# Patient Record
Sex: Male | Born: 1950 | Race: White | Hispanic: No | Marital: Married | Smoking: Light tobacco smoker
Health system: Southern US, Community
[De-identification: ages and names within clinical notes are randomized; demographics above are authoritative.]

## PROBLEM LIST (undated history)

## (undated) DIAGNOSIS — E291 Testicular hypofunction: Secondary | ICD-10-CM

## (undated) DIAGNOSIS — M1990 Unspecified osteoarthritis, unspecified site: Secondary | ICD-10-CM

## (undated) DIAGNOSIS — I10 Essential (primary) hypertension: Secondary | ICD-10-CM

## (undated) DIAGNOSIS — E559 Vitamin D deficiency, unspecified: Secondary | ICD-10-CM

## (undated) DIAGNOSIS — T7840XA Allergy, unspecified, initial encounter: Secondary | ICD-10-CM

## (undated) DIAGNOSIS — E785 Hyperlipidemia, unspecified: Secondary | ICD-10-CM

## (undated) HISTORY — PX: KNEE ARTHROSCOPY: SUR90

## (undated) HISTORY — DX: Unspecified osteoarthritis, unspecified site: M19.90

## (undated) HISTORY — DX: Vitamin D deficiency, unspecified: E55.9

## (undated) HISTORY — DX: Testicular hypofunction: E29.1

## (undated) HISTORY — PX: POLYPECTOMY: SHX149

## (undated) HISTORY — DX: Allergy, unspecified, initial encounter: T78.40XA

## (undated) HISTORY — DX: Hyperlipidemia, unspecified: E78.5

## (undated) HISTORY — PX: COLONOSCOPY: SHX174

## (undated) HISTORY — DX: Essential (primary) hypertension: I10

---
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
## 1981-07-09 HISTORY — PX: ANKLE SURGERY: SHX546

## 2001-01-13 ENCOUNTER — Ambulatory Visit (HOSPITAL_COMMUNITY): Admission: RE | Admit: 2001-01-13 | Discharge: 2001-01-13 | Payer: Self-pay | Admitting: Internal Medicine

## 2001-01-13 ENCOUNTER — Encounter: Payer: Self-pay | Admitting: Internal Medicine

## 2001-07-02 ENCOUNTER — Emergency Department (HOSPITAL_COMMUNITY): Admission: EM | Admit: 2001-07-02 | Discharge: 2001-07-02 | Payer: Self-pay | Admitting: Emergency Medicine

## 2002-08-21 ENCOUNTER — Encounter: Payer: Self-pay | Admitting: Internal Medicine

## 2002-08-21 ENCOUNTER — Encounter: Admission: RE | Admit: 2002-08-21 | Discharge: 2002-08-21 | Payer: Self-pay | Admitting: Internal Medicine

## 2005-01-13 ENCOUNTER — Emergency Department (HOSPITAL_COMMUNITY): Admission: EM | Admit: 2005-01-13 | Discharge: 2005-01-13 | Payer: Self-pay | Admitting: Emergency Medicine

## 2008-03-11 ENCOUNTER — Ambulatory Visit: Payer: Self-pay | Admitting: Gastroenterology

## 2008-03-22 ENCOUNTER — Ambulatory Visit: Payer: Self-pay | Admitting: Gastroenterology

## 2008-03-22 ENCOUNTER — Encounter: Payer: Self-pay | Admitting: Gastroenterology

## 2008-03-25 ENCOUNTER — Encounter: Payer: Self-pay | Admitting: Gastroenterology

## 2010-07-09 HISTORY — PX: MOUTH SURGERY: SHX715

## 2013-02-05 ENCOUNTER — Encounter: Payer: Self-pay | Admitting: Gastroenterology

## 2013-05-19 ENCOUNTER — Encounter: Payer: Self-pay | Admitting: Gastroenterology

## 2013-07-17 ENCOUNTER — Ambulatory Visit (AMBULATORY_SURGERY_CENTER): Payer: Self-pay | Admitting: *Deleted

## 2013-07-17 VITALS — Ht 72.0 in | Wt 210.2 lb

## 2013-07-17 DIAGNOSIS — Z86010 Personal history of colonic polyps: Secondary | ICD-10-CM

## 2013-07-17 MED ORDER — NA SULFATE-K SULFATE-MG SULF 17.5-3.13-1.6 GM/177ML PO SOLN
1.0000 | Freq: Once | ORAL | Status: DC
Start: 2013-07-17 — End: 2013-07-31

## 2013-07-17 NOTE — Progress Notes (Signed)
No allergies to eggs or soy. No problems with anesthesia.  

## 2013-07-20 ENCOUNTER — Encounter: Payer: Self-pay | Admitting: Gastroenterology

## 2013-07-31 ENCOUNTER — Encounter: Payer: Self-pay | Admitting: Gastroenterology

## 2013-07-31 ENCOUNTER — Ambulatory Visit (AMBULATORY_SURGERY_CENTER): Payer: BC Managed Care – PPO | Admitting: Gastroenterology

## 2013-07-31 VITALS — BP 112/75 | HR 65 | Temp 96.9°F | Resp 16 | Ht 72.0 in | Wt 210.0 lb

## 2013-07-31 DIAGNOSIS — K5730 Diverticulosis of large intestine without perforation or abscess without bleeding: Secondary | ICD-10-CM

## 2013-07-31 DIAGNOSIS — Z800 Family history of malignant neoplasm of digestive organs: Secondary | ICD-10-CM

## 2013-07-31 DIAGNOSIS — Z86010 Personal history of colonic polyps: Secondary | ICD-10-CM

## 2013-07-31 MED ORDER — SODIUM CHLORIDE 0.9 % IV SOLN: 500.0000 mL | INTRAVENOUS | Status: DC

## 2013-07-31 NOTE — Patient Instructions (Signed)
Impressions/recommendations:  Internal hemorrhoids (handouts given) Diverticulosis (handout given) High Fiber diet (handout given)  YOU HAD AN ENDOSCOPIC PROCEDURE TODAY AT THE [facility]: Refer to the procedure report that was given to you for any specific questions about what was found during the examination.  If the procedure report does not answer your questions, please call your gastroenterologist to clarify.  If you requested that your care partner not be given the details of your procedure findings, then the procedure report has been included in a sealed envelope for you to review at your convenience later.  YOU SHOULD EXPECT: Some feelings of bloating in the abdomen. Passage of more gas than usual.  Walking can help get rid of the air that was put into your GI tract during the procedure and reduce the bloating. If you had a lower endoscopy (such as a colonoscopy or flexible sigmoidoscopy) you may notice spotting of blood in your stool or on the toilet paper. If you underwent a bowel prep for your procedure, then you may not have a normal bowel movement for a few days.  DIET: Your first meal following the procedure should be a light meal and then it is ok to progress to your normal diet.  A half-sandwich or bowl of soup is an example of a good first meal.  Heavy or fried foods are harder to digest and may make you feel nauseous or bloated.  Likewise meals heavy in dairy and vegetables can cause extra gas to form and this can also increase the bloating.  Drink plenty of fluids but you should avoid alcoholic beverages for 24 hours.  ACTIVITY: Your care partner should take you home directly after the procedure.  You should plan to take it easy, moving slowly for the rest of the day.  You can resume normal activity the day after the procedure however you should NOT DRIVE or use heavy machinery for 24 hours (because of the sedation medicines used during the test).    SYMPTOMS TO REPORT  IMMEDIATELY: A gastroenterologist can be reached at any hour.  During normal business hours, 8:30 AM to 5:00 PM Monday through Friday, call [phone].  After hours and on weekends, please call the GI answering service at [phone] who will take a message and have the physician on call contact you.   Following lower endoscopy (colonoscopy or flexible sigmoidoscopy):  Excessive amounts of blood in the stool  Significant tenderness or worsening of abdominal pains  Swelling of the abdomen that is new, acute  Fever of 100F or higher  FOLLOW UP: If any biopsies were taken you will be contacted by phone or by letter within the next 1-3 weeks.  Call your gastroenterologist if you have not heard about the biopsies in 3 weeks.  Our staff will call the home number listed on your records the next business day following your procedure to check on you and address any questions or concerns that you may have at that time regarding the information given to you following your procedure. This is a courtesy call and so if there is no answer at the home number and we have not heard from you through the emergency physician on call, we will assume that you have returned to your regular daily activities without incident.  SIGNATURES/CONFIDENTIALITY: You and/or your care partner have signed paperwork which will be entered into your electronic medical record.  These signatures attest to the fact that that the information above on your After Visit  Summary has been reviewed and is understood.  Full responsibility of the confidentiality of this discharge information lies with you and/or your care-partner. 

## 2013-07-31 NOTE — Op Note (Signed)
[facility] [address] [place], [id number]   COLONOSCOPY PROCEDURE REPORT  PATIENT: Brett Perez, Brett G.  MR#: [id number] BIRTHDATE: 05/08/1951 , 62  yrs. old GENDER: Male ENDOSCOPIST: Louis Meckelobert D Kaplan, MD REFERRED QM:VHQIONGBY:William Oneta RackMcKeown, M.D. PROCEDURE DATE:  07/31/2013 PROCEDURE:   Colonoscopy, diagnostic First Screening Colonoscopy - Avg.  risk and is 50 yrs.  old or older - No.      History of Adenoma - Now for follow-up colonoscopy & has been > or = to 3 yrs.  Yes hx of adenoma.  Has been 3 or more years since last colonoscopy.  Polyps Removed Today? No.  Recommend repeat exam, <10 yrs? Yes.  High risk (family or personal hx). ASA CLASS:   Class II INDICATIONS:Patient's personal history of adenomatous colon polyps 2003 and 2009 MEDICATIONS: MAC sedation, administered by CRNA and propofol (Diprivan) 350mg  IV  DESCRIPTION OF PROCEDURE:   After the risks benefits and alternatives of the procedure were thoroughly explained, informed consent was obtained.  A digital rectal exam revealed no abnormalities of the rectum.   The LB [id number]  endoscope was introduced through the anus and advanced to the cecum, which was identified by both the appendix and ileocecal valve. No adverse events experienced.   The quality of the prep was excellent using Suprep  The instrument was then slowly withdrawn as the colon was fully examined.      COLON FINDINGS: Internal hemorrhoids were found.   The colon was otherwise normal.  There was no diverticulosis, inflammation, polyps or cancers unless previously stated.   Mild diverticulosis was noted in the sigmoid colon.  Retroflexed views revealed no abnormalities. The time to cecum=5 minutes 01 seconds.  Withdrawal time=9 minutes 24 seconds.  The scope was withdrawn and the procedure completed. COMPLICATIONS: There were no complications.  ENDOSCOPIC IMPRESSION: 1.   Internal hemorrhoids 2.    Diverticulosis  RECOMMENDATIONS: Given your significant family history of colon cancer, you should have a repeat colonoscopy in 5 years   eSigned:  Louis Meckelobert D Kaplan, MD 07/31/2013 10:40 AM   cc:   PATIENT NAME:  Brett Perez, Brett G. MR#: [id number]

## 2013-08-03 ENCOUNTER — Telehealth: Payer: Self-pay | Admitting: *Deleted

## 2013-08-03 NOTE — Telephone Encounter (Signed)
Name identifier, left message, follow-up 

## 2013-08-11 DIAGNOSIS — E349 Endocrine disorder, unspecified: Secondary | ICD-10-CM | POA: Insufficient documentation

## 2013-08-11 DIAGNOSIS — I10 Essential (primary) hypertension: Secondary | ICD-10-CM | POA: Insufficient documentation

## 2013-08-11 DIAGNOSIS — E559 Vitamin D deficiency, unspecified: Secondary | ICD-10-CM | POA: Insufficient documentation

## 2013-08-11 DIAGNOSIS — E782 Mixed hyperlipidemia: Secondary | ICD-10-CM | POA: Insufficient documentation

## 2013-08-12 ENCOUNTER — Encounter: Payer: Self-pay | Admitting: Internal Medicine

## 2013-08-12 NOTE — Addendum Note (Signed)
Addended by: Maple HudsonSMITH, KATHY S on: 08/12/2013 11:04 AM   Modules accepted: Level of Service

## 2013-08-13 ENCOUNTER — Encounter: Payer: Self-pay | Admitting: Internal Medicine

## 2013-08-13 ENCOUNTER — Ambulatory Visit (INDEPENDENT_AMBULATORY_CARE_PROVIDER_SITE_OTHER): Payer: BC Managed Care – PPO | Admitting: Internal Medicine

## 2013-08-13 VITALS — BP 126/84 | HR 56 | Temp 97.9°F | Resp 16 | Ht 73.5 in | Wt 211.0 lb

## 2013-08-13 DIAGNOSIS — I10 Essential (primary) hypertension: Secondary | ICD-10-CM

## 2013-08-13 DIAGNOSIS — Z1212 Encounter for screening for malignant neoplasm of rectum: Secondary | ICD-10-CM

## 2013-08-13 DIAGNOSIS — Z0000 Encounter for general adult medical examination without abnormal findings: Secondary | ICD-10-CM

## 2013-08-13 DIAGNOSIS — R740 Nonspecific elevation of levels of transaminase and lactic acid dehydrogenase [LDH]: Secondary | ICD-10-CM

## 2013-08-13 DIAGNOSIS — Z79899 Other long term (current) drug therapy: Secondary | ICD-10-CM

## 2013-08-13 DIAGNOSIS — R7309 Other abnormal glucose: Secondary | ICD-10-CM | POA: Insufficient documentation

## 2013-08-13 DIAGNOSIS — R7402 Elevation of levels of lactic acid dehydrogenase (LDH): Secondary | ICD-10-CM

## 2013-08-13 DIAGNOSIS — E559 Vitamin D deficiency, unspecified: Secondary | ICD-10-CM

## 2013-08-13 DIAGNOSIS — Z113 Encounter for screening for infections with a predominantly sexual mode of transmission: Secondary | ICD-10-CM

## 2013-08-13 DIAGNOSIS — Z125 Encounter for screening for malignant neoplasm of prostate: Secondary | ICD-10-CM

## 2013-08-13 DIAGNOSIS — Z111 Encounter for screening for respiratory tuberculosis: Secondary | ICD-10-CM

## 2013-08-13 DIAGNOSIS — R7401 Elevation of levels of liver transaminase levels: Secondary | ICD-10-CM

## 2013-08-13 LAB — CBC WITH DIFFERENTIAL/PLATELET
BASOS ABS: 0 10*3/uL (ref 0.0–0.1)
BASOS PCT: 1 % (ref 0–1)
Eosinophils Absolute: 0.2 10*3/uL (ref 0.0–0.7)
Eosinophils Relative: 3 % (ref 0–5)
HCT: 43.9 % (ref 39.0–52.0)
Hemoglobin: 15.7 g/dL (ref 13.0–17.0)
Lymphocytes Relative: 51 % — ABNORMAL HIGH (ref 12–46)
Lymphs Abs: 3 10*3/uL (ref 0.7–4.0)
MCH: 31 pg (ref 26.0–34.0)
MCHC: 35.8 g/dL (ref 30.0–36.0)
MCV: 86.6 fL (ref 78.0–100.0)
Monocytes Absolute: 0.5 10*3/uL (ref 0.1–1.0)
Monocytes Relative: 9 % (ref 3–12)
NEUTROS ABS: 2.1 10*3/uL (ref 1.7–7.7)
NEUTROS PCT: 36 % — AB (ref 43–77)
PLATELETS: 261 10*3/uL (ref 150–400)
RBC: 5.07 MIL/uL (ref 4.22–5.81)
RDW: 13.7 % (ref 11.5–15.5)
WBC: 5.8 10*3/uL (ref 4.0–10.5)

## 2013-08-13 LAB — HEPATIC FUNCTION PANEL
ALBUMIN: 4.5 g/dL (ref 3.5–5.2)
ALK PHOS: 52 U/L (ref 39–117)
ALT: 51 U/L (ref 0–53)
AST: 32 U/L (ref 0–37)
Bilirubin, Direct: 0.2 mg/dL (ref 0.0–0.3)
Indirect Bilirubin: 0.5 mg/dL (ref 0.2–1.2)
TOTAL PROTEIN: 6.7 g/dL (ref 6.0–8.3)
Total Bilirubin: 0.7 mg/dL (ref 0.2–1.2)

## 2013-08-13 LAB — MAGNESIUM: Magnesium: 1.9 mg/dL (ref 1.5–2.5)

## 2013-08-13 LAB — HEMOGLOBIN A1C
Hgb A1c MFr Bld: 5.4 % (ref <5.7)
Mean Plasma Glucose: 108 mg/dL (ref <117)

## 2013-08-13 LAB — BASIC METABOLIC PANEL WITH GFR
BUN: 14 mg/dL (ref 6–23)
CHLORIDE: 104 meq/L (ref 96–112)
CO2: 27 mEq/L (ref 19–32)
CREATININE: 0.89 mg/dL (ref 0.50–1.35)
Calcium: 9.3 mg/dL (ref 8.4–10.5)
Glucose, Bld: 86 mg/dL (ref 70–99)
POTASSIUM: 4.1 meq/L (ref 3.5–5.3)
Sodium: 138 mEq/L (ref 135–145)

## 2013-08-13 LAB — LIPID PANEL
CHOL/HDL RATIO: 2.3 ratio
Cholesterol: 178 mg/dL (ref 0–200)
HDL: 76 mg/dL (ref >39)
LDL CALC: 84 mg/dL (ref 0–99)
Triglycerides: 92 mg/dL (ref <150)
VLDL: 18 mg/dL (ref 0–40)

## 2013-08-13 NOTE — Progress Notes (Signed)
Patient ID: Brett Perez, male   DOB: 25-Jul-1950, 63 y.o.   MRN: [id number]  Annual Screening Comprehensive Examination  This very nice 63 y.o.  MWM presents for complete physical.  Patient has been followed for HTN, Hyperlipidemia, Testosterone  and Vitamin D Deficiency.   HTN predates since 2006. Patient's BP has been controlled at home.Today's BP: 126/84 mmHg. Patient denies any cardiac symptoms as chest pain, palpitations, shortness of breath, dizziness or ankle swelling.   Patient's hyperlipidemia is controlled with diet and medications. Patient denies myalgias or other medication SE's. Last cholesterol last visit was 177, triglycerides 76, HDL 68 and LDL 94 - all at goal.     Patient is screened prediabetes with last A1c of 5.2%. Patient denies reactive hypoglycemic symptoms, visual blurring, diabetic polys, or paresthesias.    Finally, patient has history of Vitamin D Deficiency with last vitamin D of 77. He also has Testosterone Deficiency and ha sbeen on replacement therapy since 2009.       Medication List         ANDROGEL 20.25 MG/1.25GM (1.62%) Gel  Generic drug:  Testosterone  Place onto the skin. Apply 4 pumps daily.  Pt states he applies 4 small pumps to each arm daily     aspirin 81 MG tablet  Take 81 mg by mouth 3 (three) times a week.     ATENOLOL PO  Take 100 mg by mouth daily.     CRESTOR PO  Take 40 mg by mouth daily.     Fish Oil 1000 MG Caps  Take by mouth daily.     glucosamine-chondroitin 500-400 MG tablet  Take 1 tablet by mouth 2 (two) times daily.     multivitamin tablet  Take 1 tablet by mouth daily.     vitamin C 500 MG tablet  Commonly known as:  ASCORBIC ACID  Take 500 mg by mouth daily.     VITAMIN D PO  Take by mouth. Takes 4000 units daily        No Known Allergies  Past Medical History  Diagnosis Date  . Hyperlipidemia   . Hypertension   . Vitamin D deficiency   . Other testicular hypofunction     Past Surgical History   Procedure Laterality Date  . Appendectomy  1960  . Ankle surgery Left 1983    for bone spur  . Tonsillectomy  1958  . Knee arthroscopy Left 1986, 1987    Family History  Problem Relation Age of Onset  . Colon cancer Mother 13  . Colon cancer Maternal Uncle 30    History   Social History  . Marital Status: Married    Spouse Name: N/A    Number of Children: N/A  . Years of Education: N/A   Occupational History  . Not on file.   Social History Main Topics  . Smoking status: Current Some Day Smoker    Types: Cigars  . Smokeless tobacco: Never Used     Comment: occasional cigar//once a month  . Alcohol Use: 6.0 oz/week    10 Cans of beer per week  . Drug Use: No  . Sexual Activity: Not on file     ROS Constitutional: Denies fever, chills, weight loss/gain, headaches, insomnia, fatigue, night sweats, and change in appetite. Eyes: Denies redness, blurred vision, diplopia, discharge, itchy, watery eyes.  ENT: Denies discharge, congestion, post nasal drip, epistaxis, sore throat, earache, hearing loss, dental pain, Tinnitus, Vertigo, Sinus pain, snoring.  Cardio: Denies chest  pain, palpitations, irregular heartbeat, syncope, dyspnea, diaphoresis, orthopnea, PND, claudication, edema Respiratory: denies cough, dyspnea, DOE, pleurisy, hoarseness, laryngitis, wheezing.  Gastrointestinal: Denies dysphagia, heartburn, reflux, water brash, pain, cramps, nausea, vomiting, bloating, diarrhea, constipation, hematemesis, melena, hematochezia, jaundice, hemorrhoids Genitourinary: Denies dysuria, frequency, urgency, nocturia, hesitancy, discharge, hematuria, flank pain Musculoskeletal: Denies arthralgia, myalgia, stiffness, Jt. Swelling, pain, limp, and strain/sprain. Skin: Denies puritis, rash, hives, warts, acne, eczema, changing in skin lesion Neuro: No weakness, tremor, incoordination, spasms, paresthesia, pain Psychiatric: Denies confusion, memory loss, sensory loss Endocrine: Denies  change in weight, skin, hair change, nocturia, and paresthesia, diabetic polys, visual blurring, hyper / hypo glycemic episodes.  Heme/Lymph: No excessive bleeding, bruising, or elarged lymph nodes.  BP: 126/84  Pulse: 56  Temp: 97.9 F (36.6 C)  Resp: 16    Estimated body mass index is 27.46 kg/(m^2) as calculated from the following:   Height as of this encounter: 6' 1.5" (1.867 m).   Weight as of this encounter: 211 lb (95.709 kg).  Physical Exam General Appearance: Well nourished, in no apparent distress. Eyes: PERRLA, EOMs, conjunctiva no swelling or erythema, normal fundi and vessels. Sinuses: No frontal/maxillary tenderness ENT/Mouth: EACs patent / TMs  nl. Nares clear without erythema, swelling, mucoid exudates. Oral hygiene is good. No erythema, swelling, or exudate. Tongue normal, non-obstructing. Tonsils not swollen or erythematous. Hearing normal.  Neck: Supple, thyroid normal. No bruits, nodes or JVD. Respiratory: Respiratory effort normal.  BS equal and clear bilateral without rales, rhonci, wheezing or stridor. Cardio: Heart sounds are normal with regular rate and rhythm and no murmurs, rubs or gallops. Peripheral pulses are normal and equal bilaterally without edema. No aortic or femoral bruits. Chest: symmetric with normal excursions and percussion.  Abdomen: Flat, soft, with bowl sounds. Nontender, no guarding, rebound, hernias, masses, or organomegaly.  Lymphatics: Non tender without lymphadenopathy.  Genitourinary: No hernias.Testes nl. DRE - prostate nl for age - smooth & firm w/o nodules. Musculoskeletal: Full ROM all peripheral extremities, joint stability, 5/5 strength, and normal gait. Skin: Warm and dry without rashes, lesions, cyanosis, clubbing or  ecchymosis.  Neuro: Cranial nerves intact, reflexes equal bilaterally. Normal muscle tone, no cerebellar symptoms. Sensation intact.  Pysch: Awake and oriented X 3, normal affect, insight and judgment appropriate.    Assessment and Plan  1. Annual Screening Examination 2. Hypertension  3. Hyperlipidemia 4. Pre Diabetes 5. Vitamin D Deficiency 6. Testosterone Deficiency  Continue prudent diet as discussed, weight control, BP monitoring, regular exercise, and medications as discussed.  Discussed med effects and SE's. Routine screening labs and tests as requested with regular follow-up as recommended.

## 2013-08-13 NOTE — Patient Instructions (Signed)

## 2013-08-14 LAB — URINALYSIS, MICROSCOPIC ONLY
Bacteria, UA: NONE SEEN
Casts: NONE SEEN
Crystals: NONE SEEN
SQUAMOUS EPITHELIAL / LPF: NONE SEEN

## 2013-08-14 LAB — VITAMIN D 25 HYDROXY (VIT D DEFICIENCY, FRACTURES): Vit D, 25-Hydroxy: 78 ng/mL (ref 30–89)

## 2013-08-14 LAB — HEPATITIS B CORE ANTIBODY, TOTAL: Hep B Core Total Ab: NONREACTIVE

## 2013-08-14 LAB — MICROALBUMIN / CREATININE URINE RATIO
Creatinine, Urine: 45.8 mg/dL
Microalb Creat Ratio: 10.9 mg/g (ref 0.0–30.0)
Microalb, Ur: 0.5 mg/dL (ref 0.00–1.89)

## 2013-08-14 LAB — HEPATITIS B SURFACE ANTIBODY,QUALITATIVE: HEP B S AB: NEGATIVE

## 2013-08-14 LAB — INSULIN, FASTING: Insulin fasting, serum: 8 u[IU]/mL (ref 3–28)

## 2013-08-14 LAB — HIV ANTIBODY (ROUTINE TESTING W REFLEX): HIV: NONREACTIVE

## 2013-08-14 LAB — TESTOSTERONE: Testosterone: 479 ng/dL (ref 300–890)

## 2013-08-14 LAB — TSH: TSH: 1.716 u[IU]/mL (ref 0.350–4.500)

## 2013-08-14 LAB — VITAMIN B12: Vitamin B-12: 389 pg/mL (ref 211–911)

## 2013-08-14 LAB — HEPATITIS C ANTIBODY: HCV AB: NEGATIVE

## 2013-08-14 LAB — RPR

## 2013-08-14 LAB — PSA: PSA: 0.87 ng/mL (ref <=4.00)

## 2013-08-14 LAB — HEPATITIS A ANTIBODY, TOTAL: Hep A Total Ab: REACTIVE — AB

## 2013-08-17 ENCOUNTER — Telehealth: Payer: Self-pay

## 2013-08-17 LAB — HEPATITIS B E ANTIBODY: Hepatitis Be Antibody: NEGATIVE

## 2013-08-17 NOTE — Telephone Encounter (Signed)
Message copied by Joya MartyrZMENT, Keltie Labell M on Mon Aug 17, 2013 12:54 PM ------      Message from: Lucky CowboyMCKEOWN, WILLIAM      Created: Sun Aug 16, 2013 10:35 PM       CBC kidneys liver thyroid Mag PSA B12 testosterone all nl/ok      Hep B & C both negative - Hep A is positive - suggests either previous vaccination or infection which has resolved      HIV/AIDS & RPR/Syphilis both negative       Vit D 78 - excellent  - A!C & Insulin - both normal - no sign of diabetes      Chol 178 & HDL 76 also excellent - very low risk for Herat Attack       ------

## 2013-08-17 NOTE — Telephone Encounter (Signed)
lmom to pt about lab results. 

## 2013-08-18 ENCOUNTER — Other Ambulatory Visit: Payer: BC Managed Care – PPO

## 2013-08-18 DIAGNOSIS — Z111 Encounter for screening for respiratory tuberculosis: Secondary | ICD-10-CM

## 2013-08-18 LAB — TB SKIN TEST
INDURATION: 0 mm
TB SKIN TEST: NEGATIVE

## 2013-11-11 ENCOUNTER — Ambulatory Visit: Payer: Self-pay | Admitting: Internal Medicine

## 2014-02-16 ENCOUNTER — Encounter: Payer: Self-pay | Admitting: Internal Medicine

## 2014-02-16 ENCOUNTER — Ambulatory Visit (INDEPENDENT_AMBULATORY_CARE_PROVIDER_SITE_OTHER): Payer: BC Managed Care – PPO | Admitting: Internal Medicine

## 2014-02-16 VITALS — BP 122/80 | HR 64 | Temp 97.9°F | Resp 16 | Ht 73.25 in | Wt 207.8 lb

## 2014-02-16 DIAGNOSIS — L989 Disorder of the skin and subcutaneous tissue, unspecified: Secondary | ICD-10-CM

## 2014-02-16 MED ORDER — PREDNISONE 20 MG PO TABS: ORAL_TABLET | ORAL | Status: DC

## 2014-02-16 NOTE — Progress Notes (Signed)
   Subjective:    Patient ID: Brett Perez, male    DOB: 01/22/1951, 63 y.o.   MRN: [id number]  HPI Patient presents with a 2 week Hx/o a prickly type dermatitis of the extremities.    Medication List   ANDROGEL 20.25 MG/1.25GM (1.62%) Gel  Generic drug:  Testosterone  Place onto the skin. Apply 4 pumps daily.  Pt states he applies 4 small pumps to each arm daily     aspirin 81 MG tablet  Take 81 mg by mouth 3 (three) times a week.     ATENOLOL PO  Take 100 mg by mouth daily.     CRESTOR PO  Take 40 mg by mouth daily.     Fish Oil 1000 MG Caps  Take by mouth daily.     glucosamine-chondroitin 500-400 MG tablet  Take 1 tablet by mouth 2 (two) times daily.     multivitamin tablet  Take 1 tablet by mouth daily.     vitamin C 500 MG tablet  Commonly known as:  ASCORBIC ACID  Take 500 mg by mouth daily.     VITAMIN D PO  Take by mouth. Takes 4000 units daily     No Known Allergies  Past Medical History  Diagnosis Date  . Hyperlipidemia   . Hypertension   . Vitamin D deficiency   . Other testicular hypofunction    Review of Systems Neg to above     Objective:   Physical Exam BP 122/80  Pulse 64  Temp(Src) 97.9 F (36.6 C) (Temporal)  Resp 16  Ht 6' 1.25" (1.861 m)  Wt 207 lb 12.8 oz (94.257 kg)  BMI 27.22 kg/m2  Focused Exam of the skin show a prickly type rash consisting if 2-3 mm sl raised pinkish smooth lesions scattered over the extremities in no consistent pattern.   Assessment & Plan:   1. Eczematous skin lesions  . Rx Prednisone 20 mg #20 taper

## 2014-02-16 NOTE — Patient Instructions (Signed)
Eczema Eczema, also called atopic dermatitis, is a skin disorder that causes inflammation of the skin. It causes a red rash and dry, scaly skin. The skin becomes very itchy. Eczema is generally worse during the cooler winter months and often improves with the warmth of summer. Eczema usually starts showing signs in infancy. Some children outgrow eczema, but it may last through adulthood.  CAUSES  The exact cause of eczema is not known, but it appears to run in families. People with eczema often have a family history of eczema, allergies, asthma, or hay fever. Eczema is not contagious. Flare-ups of the condition may be caused by:   Contact with something you are sensitive or allergic to.   Stress. SIGNS AND SYMPTOMS  Dry, scaly skin.   Red, itchy rash.   Itchiness. This may occur before the skin rash and may be very intense.  DIAGNOSIS  The diagnosis of eczema is usually made based on symptoms and medical history. TREATMENT  Eczema cannot be cured, but symptoms usually can be controlled with treatment and other strategies. A treatment plan might include:  Controlling the itching and scratching.   Use over-the-counter antihistamines as directed for itching. This is especially useful at night when the itching tends to be worse.   Use over-the-counter steroid creams as directed for itching.   Avoid scratching. Scratching makes the rash and itching worse. It may also result in a skin infection (impetigo) due to a break in the skin caused by scratching.   Keeping the skin well moisturized with creams every day. This will seal in moisture and help prevent dryness. Lotions that contain alcohol and water should be avoided because they can dry the skin.   Limiting exposure to things that you are sensitive or allergic to (allergens).   Recognizing situations that cause stress.   Developing a plan to manage stress.  HOME CARE INSTRUCTIONS   Only take over-the-counter or  prescription medicines as directed by your health care provider.   Do not use anything on the skin without checking with your health care provider.   Keep baths or showers short (5 minutes) in warm (not hot) water. Use mild cleansers for bathing. These should be unscented. You may add nonperfumed bath oil to the bath water. It is best to avoid soap and bubble bath.   Immediately after a bath or shower, when the skin is still damp, apply a moisturizing ointment to the entire body. This ointment should be a petroleum ointment. This will seal in moisture and help prevent dryness. The thicker the ointment, the better. These should be unscented.   Keep fingernails cut short. Children with eczema may need to wear soft gloves or mittens at night after applying an ointment.   Dress in clothes made of cotton or cotton blends. Dress lightly, because heat increases itching.   A child with eczema should stay away from anyone with fever blisters or cold sores. The virus that causes fever blisters (herpes simplex) can cause a serious skin infection in children with eczema. SEEK MEDICAL CARE IF:   Your itching interferes with sleep.   Your rash gets worse or is not better within 1 week after starting treatment.   You see pus or soft yellow scabs in the rash area.   You have a fever.   You have a rash flare-up after contact with someone who has fever blisters.  Document Released: 06/22/2000 Document Revised: 04/15/2013 Document Reviewed: 01/26/2013 [employer] Patient Information 2015 [employer]. This information   is not intended to replace advice given to you by your health care provider. Make sure you discuss any questions you have with your health care provider.  

## 2014-03-03 ENCOUNTER — Encounter: Payer: Self-pay | Admitting: Internal Medicine

## 2014-03-03 NOTE — Progress Notes (Signed)
Patient ID: Brett Perez, male   DOB: 1950-08-02, 63 y.o.   MRN: [id number]  Cancelled at confirmation call

## 2014-03-10 ENCOUNTER — Other Ambulatory Visit: Payer: Self-pay | Admitting: Internal Medicine

## 2014-03-10 MED ORDER — TESTOSTERONE 20.25 MG/ACT (1.62%) TD GEL: TRANSDERMAL | Status: DC

## 2014-04-07 ENCOUNTER — Other Ambulatory Visit: Payer: Self-pay | Admitting: Internal Medicine

## 2014-06-07 ENCOUNTER — Other Ambulatory Visit: Payer: Self-pay | Admitting: *Deleted

## 2014-06-07 MED ORDER — ATENOLOL 100 MG PO TABS: 100.0000 mg | ORAL_TABLET | Freq: Every day | ORAL | Status: DC

## 2014-06-07 MED ORDER — ROSUVASTATIN CALCIUM 40 MG PO TABS: 40.0000 mg | ORAL_TABLET | Freq: Every day | ORAL | Status: DC

## 2014-06-29 ENCOUNTER — Encounter: Payer: Self-pay | Admitting: Internal Medicine

## 2014-06-29 ENCOUNTER — Ambulatory Visit (INDEPENDENT_AMBULATORY_CARE_PROVIDER_SITE_OTHER): Payer: BC Managed Care – PPO | Admitting: Internal Medicine

## 2014-06-29 VITALS — BP 106/78 | HR 76 | Temp 98.1°F | Resp 16 | Ht 73.5 in | Wt 204.0 lb

## 2014-06-29 DIAGNOSIS — R7309 Other abnormal glucose: Secondary | ICD-10-CM

## 2014-06-29 DIAGNOSIS — E559 Vitamin D deficiency, unspecified: Secondary | ICD-10-CM

## 2014-06-29 DIAGNOSIS — I10 Essential (primary) hypertension: Secondary | ICD-10-CM

## 2014-06-29 DIAGNOSIS — Z79899 Other long term (current) drug therapy: Secondary | ICD-10-CM

## 2014-06-29 DIAGNOSIS — E785 Hyperlipidemia, unspecified: Secondary | ICD-10-CM

## 2014-06-29 DIAGNOSIS — E291 Testicular hypofunction: Secondary | ICD-10-CM

## 2014-06-29 DIAGNOSIS — E349 Endocrine disorder, unspecified: Secondary | ICD-10-CM

## 2014-06-29 DIAGNOSIS — R7303 Prediabetes: Secondary | ICD-10-CM

## 2014-06-29 DIAGNOSIS — Z23 Encounter for immunization: Secondary | ICD-10-CM

## 2014-06-29 LAB — CBC WITH DIFFERENTIAL/PLATELET
BASOS PCT: 0 % (ref 0–1)
Basophils Absolute: 0 10*3/uL (ref 0.0–0.1)
EOS ABS: 0.1 10*3/uL (ref 0.0–0.7)
Eosinophils Relative: 2 % (ref 0–5)
HEMATOCRIT: 42.1 % (ref 39.0–52.0)
Hemoglobin: 15.3 g/dL (ref 13.0–17.0)
Lymphocytes Relative: 46 % (ref 12–46)
Lymphs Abs: 2.7 10*3/uL (ref 0.7–4.0)
MCH: 31.2 pg (ref 26.0–34.0)
MCHC: 36.3 g/dL — ABNORMAL HIGH (ref 30.0–36.0)
MCV: 85.9 fL (ref 78.0–100.0)
MPV: 9.3 fL — ABNORMAL LOW (ref 9.4–12.4)
Monocytes Absolute: 0.6 10*3/uL (ref 0.1–1.0)
Monocytes Relative: 11 % (ref 3–12)
NEUTROS ABS: 2.4 10*3/uL (ref 1.7–7.7)
Neutrophils Relative %: 41 % — ABNORMAL LOW (ref 43–77)
PLATELETS: 265 10*3/uL (ref 150–400)
RBC: 4.9 MIL/uL (ref 4.22–5.81)
RDW: 13.9 % (ref 11.5–15.5)
WBC: 5.9 10*3/uL (ref 4.0–10.5)

## 2014-06-29 NOTE — Progress Notes (Signed)
Patient ID: Brett MastersRobert G Forand, male   DOB: 03/29/1951, 63 y.o.   MRN: [id number]   This very nice 63 y.o.MWM presents for 3 month follow up with Hypertension, Hyperlipidemia, Pre-Diabetes, Testosterone  and Vitamin D Deficiency.    Patient is treated for HTN & BP has been controlled at home. Today's BP: 106/78 mmHg. Patient has had no complaints of any cardiac type chest pain, palpitations, dyspnea/orthopnea/PND, dizziness, claudication, or dependent edema.   Hyperlipidemia is controlled with diet & meds. Patient denies myalgias or other med SE's. Last Lipids were Total Chol 178; HDL 76; LDL 84; Triglycerides 92 on 08/13/2013.   Also, the patient has history of PreDiabetes and has had no symptoms of reactive hypoglycemia, diabetic polys, paresthesias or visual blurring.  Last A1c was 5.4% on  08/13/2013.   Patient has Testosterone Deficiency and is on replacement therapy with Testosterone Gel and reports improved sense of well being. Further, the patient also has history of Vitamin D Deficiency and supplements vitamin D without any suspected side-effects. Last vitamin D was  78 on  08/13/2013.   Medication List   atenolol 100 MG tablet  Commonly known as:  TENORMIN  Take 1 tablet (100 mg total) by mouth daily.     Fish Oil 1000 MG Caps  Take by mouth daily.     glucosamine-chondroitin 500-400 MG tablet  Take 1 tablet by mouth 2 (two) times daily.     multivitamin tablet  Take 1 tablet by mouth daily.     rosuvastatin 40 MG tablet  Commonly known as:  CRESTOR  Take 1 tablet (40 mg total) by mouth daily.     Testosterone 20.25 MG/ACT (1.62%) Gel  Commonly known as:  ANDROGEL PUMP  APPLY 1 PUMP TO EACH UPPER ARM AS DIRECTED     vitamin C 500 MG tablet  Commonly known as:  ASCORBIC ACID  Take 500 mg by mouth daily.     VITAMIN D PO  Take by mouth. Takes 4000 units daily       No Known Allergies  PMHx:   Past Medical History  Diagnosis Date  . Hyperlipidemia   . Hypertension   .  Vitamin D deficiency   . Other testicular hypofunction    Immunization History  Administered Date(s) Administered  . Pneumococcal-Unspecified 07/09/1997  . Td 07/09/2004  . Zoster 06/29/2014   Past Surgical History  Procedure Laterality Date  . Appendectomy  1960  . Ankle surgery Left 1983    for bone spur  . Tonsillectomy  1958  . Knee arthroscopy Left 1986, 1987   FHx:    Reviewed / unchanged  SHx:    Reviewed / unchanged  Systems Review:  Constitutional: Denies fever, chills, wt changes, headaches, insomnia, fatigue, night sweats, change in appetite. Eyes: Denies redness, blurred vision, diplopia, discharge, itchy, watery eyes.  ENT: Denies discharge, congestion, post nasal drip, epistaxis, sore throat, earache, hearing loss, dental pain, tinnitus, vertigo, sinus pain, snoring.  CV: Denies chest pain, palpitations, irregular heartbeat, syncope, dyspnea, diaphoresis, orthopnea, PND, claudication or edema. Respiratory: denies cough, dyspnea, DOE, pleurisy, hoarseness, laryngitis, wheezing.  Gastrointestinal: Denies dysphagia, odynophagia, heartburn, reflux, water brash, abdominal pain or cramps, nausea, vomiting, bloating, diarrhea, constipation, hematemesis, melena, hematochezia  or hemorrhoids. Genitourinary: Denies dysuria, frequency, urgency, nocturia, hesitancy, discharge, hematuria or flank pain. Musculoskeletal: Denies arthralgias, myalgias, stiffness, jt. swelling, pain, limping or strain/sprain.  Skin: Denies pruritus, rash, hives, warts, acne, eczema or change in skin lesion(s). Neuro: No weakness, tremor, incoordination, spasms,  paresthesia or pain. Psychiatric: Denies confusion, memory loss or sensory loss. Endo: Denies change in weight, skin or hair change.  Heme/Lymph: No excessive bleeding, bruising or enlarged lymph nodes.  Physical Exam  BP 106/78   Pulse 76  Temp 98.1 F   Resp 16  Ht 6' 1.5"   Wt 204 lb                 BMI 26.55   Appears well  nourished and in no distress. Eyes: PERRLA, EOMs, conjunctiva no swelling or erythema. Sinuses: No frontal/maxillary tenderness ENT/Mouth: EAC's clear, TM's nl w/o erythema, bulging. Nares clear w/o erythema, swelling, exudates. Oropharynx clear without erythema or exudates. Oral hygiene is good. Tongue normal, non obstructing. Hearing intact.  Neck: Supple. Thyroid nl. Car 2+/2+ without bruits, nodes or JVD. Chest: Respirations nl with BS clear & equal w/o rales, rhonchi, wheezing or stridor.  Cor: Heart sounds normal w/ regular rate and rhythm without sig. murmurs, gallops, clicks, or rubs. Peripheral pulses normal and equal  without edema.  Abdomen: Soft & bowel sounds normal. Non-tender w/o guarding, rebound, hernias, masses, or organomegaly.  Lymphatics: Unremarkable.  Musculoskeletal: Full ROM all peripheral extremities, joint stability, 5/5 strength, and normal gait.  Skin: Warm, dry without exposed rashes, lesions or ecchymosis apparent.  Neuro: Cranial nerves intact, reflexes equal bilaterally. Sensory-motor testing grossly intact. Tendon reflexes grossly intact.  Pysch: Alert & oriented x 3.  Insight and judgement nl & appropriate. No ideations.  Assessment and Plan:  1. Hypertension - Continue monitor blood pressure at home. Continue diet/meds same.  2. Hyperlipidemia - Continue diet/meds, exercise,& lifestyle modifications. Continue monitor periodic cholesterol/liver & renal functions   3.  Pre-Diabetes - Continue diet, exercise, lifestyle modifications. Monitor appropriate labs.  4. Vitamin D Deficiency - Continue supplementation.  5. Testosterone Deficiency    Recommended regular exercise, BP monitoring, weight control, and discussed med and SE's. Recommended labs to assess and monitor clinical status. Further disposition pending results of labs.

## 2014-06-29 NOTE — Patient Instructions (Signed)

## 2014-06-30 LAB — BASIC METABOLIC PANEL WITH GFR
BUN: 14 mg/dL (ref 6–23)
CALCIUM: 9 mg/dL (ref 8.4–10.5)
CO2: 24 mEq/L (ref 19–32)
Chloride: 105 mEq/L (ref 96–112)
Creat: 0.85 mg/dL (ref 0.50–1.35)
Glucose, Bld: 117 mg/dL — ABNORMAL HIGH (ref 70–99)
POTASSIUM: 4.1 meq/L (ref 3.5–5.3)
SODIUM: 138 meq/L (ref 135–145)

## 2014-06-30 LAB — HEPATIC FUNCTION PANEL
ALK PHOS: 48 U/L (ref 39–117)
ALT: 34 U/L (ref 0–53)
AST: 28 U/L (ref 0–37)
Albumin: 4.3 g/dL (ref 3.5–5.2)
BILIRUBIN DIRECT: 0.1 mg/dL (ref 0.0–0.3)
BILIRUBIN INDIRECT: 0.4 mg/dL (ref 0.2–1.2)
Total Bilirubin: 0.5 mg/dL (ref 0.2–1.2)
Total Protein: 6.3 g/dL (ref 6.0–8.3)

## 2014-06-30 LAB — TSH: TSH: 1.052 u[IU]/mL (ref 0.350–4.500)

## 2014-06-30 LAB — LIPID PANEL
CHOLESTEROL: 169 mg/dL (ref 0–200)
HDL: 72 mg/dL (ref >39)
LDL Cholesterol: 63 mg/dL (ref 0–99)
TRIGLYCERIDES: 168 mg/dL — AB (ref <150)
Total CHOL/HDL Ratio: 2.3 Ratio
VLDL: 34 mg/dL (ref 0–40)

## 2014-06-30 LAB — VITAMIN D 25 HYDROXY (VIT D DEFICIENCY, FRACTURES): Vit D, 25-Hydroxy: 56 ng/mL (ref 30–100)

## 2014-06-30 LAB — HEMOGLOBIN A1C
HEMOGLOBIN A1C: 5.4 % (ref <5.7)
MEAN PLASMA GLUCOSE: 108 mg/dL (ref <117)

## 2014-06-30 LAB — TESTOSTERONE: Testosterone: 180 ng/dL — ABNORMAL LOW (ref 300–890)

## 2014-06-30 LAB — INSULIN, FASTING: Insulin fasting, serum: 37.2 u[IU]/mL — ABNORMAL HIGH (ref 2.0–19.6)

## 2014-06-30 LAB — MAGNESIUM: Magnesium: 2.1 mg/dL (ref 1.5–2.5)

## 2014-08-13 ENCOUNTER — Ambulatory Visit (INDEPENDENT_AMBULATORY_CARE_PROVIDER_SITE_OTHER): Payer: BLUE CROSS/BLUE SHIELD | Admitting: Internal Medicine

## 2014-08-13 ENCOUNTER — Encounter: Payer: Self-pay | Admitting: Internal Medicine

## 2014-08-13 VITALS — BP 126/82 | HR 52 | Temp 97.9°F | Resp 16 | Ht 73.5 in | Wt 206.2 lb

## 2014-08-13 DIAGNOSIS — R7309 Other abnormal glucose: Secondary | ICD-10-CM

## 2014-08-13 DIAGNOSIS — Z111 Encounter for screening for respiratory tuberculosis: Secondary | ICD-10-CM

## 2014-08-13 DIAGNOSIS — E785 Hyperlipidemia, unspecified: Secondary | ICD-10-CM

## 2014-08-13 DIAGNOSIS — Z79899 Other long term (current) drug therapy: Secondary | ICD-10-CM | POA: Diagnosis not present

## 2014-08-13 DIAGNOSIS — R5383 Other fatigue: Secondary | ICD-10-CM | POA: Diagnosis not present

## 2014-08-13 DIAGNOSIS — Z0001 Encounter for general adult medical examination with abnormal findings: Secondary | ICD-10-CM | POA: Diagnosis not present

## 2014-08-13 DIAGNOSIS — R6889 Other general symptoms and signs: Secondary | ICD-10-CM

## 2014-08-13 DIAGNOSIS — E559 Vitamin D deficiency, unspecified: Secondary | ICD-10-CM | POA: Diagnosis not present

## 2014-08-13 DIAGNOSIS — E349 Endocrine disorder, unspecified: Secondary | ICD-10-CM

## 2014-08-13 DIAGNOSIS — E291 Testicular hypofunction: Secondary | ICD-10-CM

## 2014-08-13 DIAGNOSIS — Z1212 Encounter for screening for malignant neoplasm of rectum: Secondary | ICD-10-CM

## 2014-08-13 DIAGNOSIS — R7303 Prediabetes: Secondary | ICD-10-CM

## 2014-08-13 DIAGNOSIS — Z125 Encounter for screening for malignant neoplasm of prostate: Secondary | ICD-10-CM

## 2014-08-13 DIAGNOSIS — Z23 Encounter for immunization: Secondary | ICD-10-CM

## 2014-08-13 DIAGNOSIS — I10 Essential (primary) hypertension: Secondary | ICD-10-CM

## 2014-08-13 LAB — CBC WITH DIFFERENTIAL/PLATELET
BASOS PCT: 0 % (ref 0–1)
Basophils Absolute: 0 10*3/uL (ref 0.0–0.1)
Eosinophils Absolute: 0.1 10*3/uL (ref 0.0–0.7)
Eosinophils Relative: 2 % (ref 0–5)
HCT: 44.4 % (ref 39.0–52.0)
HEMOGLOBIN: 15.4 g/dL (ref 13.0–17.0)
Lymphocytes Relative: 60 % — ABNORMAL HIGH (ref 12–46)
Lymphs Abs: 3.4 10*3/uL (ref 0.7–4.0)
MCH: 31 pg (ref 26.0–34.0)
MCHC: 34.7 g/dL (ref 30.0–36.0)
MCV: 89.5 fL (ref 78.0–100.0)
MPV: 9.2 fL (ref 8.6–12.4)
Monocytes Absolute: 0.6 10*3/uL (ref 0.1–1.0)
Monocytes Relative: 11 % (ref 3–12)
NEUTROS ABS: 1.5 10*3/uL — AB (ref 1.7–7.7)
NEUTROS PCT: 27 % — AB (ref 43–77)
PLATELETS: 232 10*3/uL (ref 150–400)
RBC: 4.96 MIL/uL (ref 4.22–5.81)
RDW: 14 % (ref 11.5–15.5)
WBC: 5.7 10*3/uL (ref 4.0–10.5)

## 2014-08-13 LAB — HEMOGLOBIN A1C
HEMOGLOBIN A1C: 5.2 % (ref <5.7)
Mean Plasma Glucose: 103 mg/dL (ref <117)

## 2014-08-13 LAB — HEPATIC FUNCTION PANEL
ALT: 32 U/L (ref 0–53)
AST: 27 U/L (ref 0–37)
Albumin: 4.4 g/dL (ref 3.5–5.2)
Alkaline Phosphatase: 51 U/L (ref 39–117)
BILIRUBIN INDIRECT: 0.6 mg/dL (ref 0.2–1.2)
Bilirubin, Direct: 0.1 mg/dL (ref 0.0–0.3)
Total Bilirubin: 0.7 mg/dL (ref 0.2–1.2)
Total Protein: 6.5 g/dL (ref 6.0–8.3)

## 2014-08-13 LAB — LIPID PANEL
CHOLESTEROL: 164 mg/dL (ref 0–200)
HDL: 75 mg/dL (ref >39)
LDL CALC: 76 mg/dL (ref 0–99)
Total CHOL/HDL Ratio: 2.2 Ratio
Triglycerides: 64 mg/dL (ref <150)
VLDL: 13 mg/dL (ref 0–40)

## 2014-08-13 LAB — BASIC METABOLIC PANEL WITH GFR
BUN: 16 mg/dL (ref 6–23)
CALCIUM: 9 mg/dL (ref 8.4–10.5)
CO2: 26 mEq/L (ref 19–32)
Chloride: 103 mEq/L (ref 96–112)
Creat: 0.97 mg/dL (ref 0.50–1.35)
GFR, EST NON AFRICAN AMERICAN: 83 mL/min
GFR, Est African American: >89 mL/min
Glucose, Bld: 89 mg/dL (ref 70–99)
POTASSIUM: 4.2 meq/L (ref 3.5–5.3)
Sodium: 138 mEq/L (ref 135–145)

## 2014-08-13 LAB — MAGNESIUM: MAGNESIUM: 2.1 mg/dL (ref 1.5–2.5)

## 2014-08-13 NOTE — Progress Notes (Signed)
Patient ID: Brett Perez, male   DOB: [birth year], 64 y.o.   MRN: [id number] Annual Comprehensive Examination  This very nice 64 y.o. MWM presents for complete physical.  Patient has been followed for HTN, Hyperlipidemia, Testosterone Deficiency and Vitamin D Deficiency.   HTN predates since 2006. Patient's BP has been controlled at home.Today's BP: 126/82 mmHg. Patient denies any cardiac symptoms as chest pain, palpitations, shortness of breath, dizziness or ankle swelling.   Patient's hyperlipidemia is controlled with diet and medications. Patient denies myalgias or other medication SE's. Last lipids were at goal - Total Chol 169; HDL 72; LDL 63; Trig 168 on 06/29/2014.   Patient is screened for prediabetes  and denies reactive hypoglycemic symptoms, visual blurring, diabetic polys or paresthesias. Last A1c was 5.4% on 06/29/2014   Patient has been on testosterone replacement (level 223 in May 2012) with improved sense of stamina. Finally, patient has history of Vitamin D Deficiency of 28 in 2008 and last vitamin D was 56 on 06/29/2014.  Medication Sig  . aspirin 81 MG tablet Take 81 mg by mouth 3 (three) times a week.  Marland Kitchen atenolol  100 MG tablet Take 1 tablet (100 mg total) by mouth daily.  Marland Kitchen VITAMIN D  Take by mouth. Takes 4000 units daily  . glucosamine-chondroitin 500-400 MG  Take 1 tablet by mouth 2 (two) times daily.  . Multiple Vitamin (MULTIVITAMIN) tablet Take 1 tablet by mouth daily.  Marland Kitchen FISH OIL 1000 MG CAPS Take by mouth daily.  . rosuvastatin 40 MG tablet Take 1 tablet (40 mg total) by mouth daily.  . ANDROGEL PUMP (1.62%) GEL APPLY 1 PUMP TO EACH UPPER ARM AS DIRECTED  . vitamin C  500 MG tablet Take 500 mg by mouth daily.   No Known Allergies   Past Medical History  Diagnosis Date  . Hyperlipidemia   . Hypertension   . Vitamin D deficiency   . Other testicular hypofunction    Health Maintenance  Topic Date Due  . TETANUS/TDAP  07/09/2014  . INFLUENZA VACCINE   02/07/2015  . COLONOSCOPY  07/31/2018  . ZOSTAVAX  Completed   Immunization History  Administered Date(s) Administered  . Influenza-Unspecified 07/01/2014  . PPD Test 08/13/2014  . Pneumococcal Conjugate-13 08/13/2014  . Pneumococcal-Unspecified 07/09/1997  . Td 07/09/2004  . Tdap 08/13/2014  . Zoster 06/29/2014   Past Surgical History  Procedure Laterality Date  . Appendectomy  1960  . Ankle surgery Left 1983    for bone spur  . Tonsillectomy  1958  . Knee arthroscopy Left 1986, 1987   Family History  Problem Relation Age of Onset  . Colon cancer Mother 71  . Colon cancer Maternal Uncle 80   History   Social History  . Marital Status: Married    Spouse Name: N/A    Number of Children: N/A  . Years of Education: N/A   Occupational History  . [employer]   Social History Main Topics  . Smoking status: Current Some Day Smoker    Types: Cigars  . Smokeless tobacco: Never Used     Comment: rarely smokes a  cigar  . Alcohol Use: 6.0 oz/week    10 Cans of beer per week  . Drug Use: No  . Sexual Activity: Active     ROS Constitutional: Denies fever, chills, weight loss/gain, headaches, insomnia, fatigue, night sweats or change in appetite. Eyes: Denies redness, blurred vision, diplopia, discharge, itchy or watery eyes.  ENT: Denies discharge, congestion, post  nasal drip, epistaxis, sore throat, earache, hearing loss, dental pain, Tinnitus, Vertigo, Sinus pain or snoring.  Cardio: Denies chest pain, palpitations, irregular heartbeat, syncope, dyspnea, diaphoresis, orthopnea, PND, claudication or edema Respiratory: denies cough, dyspnea, DOE, pleurisy, hoarseness, laryngitis or wheezing.  Gastrointestinal: Denies dysphagia, heartburn, reflux, water brash, pain, cramps, nausea, vomiting, bloating, diarrhea, constipation, hematemesis, melena, hematochezia, jaundice or hemorrhoids Genitourinary: Denies dysuria, frequency, urgency, nocturia, hesitancy, discharge,  hematuria or flank pain Musculoskeletal: Denies arthralgia, myalgia, stiffness, Jt. Swelling, pain, limp or strain/sprain. Denies Falls. Skin: Denies puritis, rash, hives, warts, acne, eczema or change in skin lesion Neuro: No weakness, tremor, incoordination, spasms, paresthesia or pain Psychiatric: Denies confusion, memory loss or sensory loss. Denies Depression. Endocrine: Denies change in weight, skin, hair change, nocturia, and paresthesia, diabetic polys, visual blurring or hyper / hypo glycemic episodes.  Heme/Lymph: No excessive bleeding, bruising or enlarged lymph nodes.  Physical Exam  BP 126/82   Pulse 52  Temp 97.9 F   Resp 16  Ht 6' 1.5"   Wt 206 lb 3.2 oz     BMI 26.83   General Appearance: Well nourished, in no apparent distress. Eyes: PERRLA, EOMs, conjunctiva no swelling or erythema, normal fundi and vessels. Sinuses: No frontal/maxillary tenderness ENT/Mouth: EACs patent / TMs  nl. Nares clear without erythema, swelling, mucoid exudates. Oral hygiene is good. No erythema, swelling, or exudate. Tongue normal, non-obstructing. Tonsils not swollen or erythematous. Hearing normal.  Neck: Supple, thyroid normal. No bruits, nodes or JVD. Respiratory: Respiratory effort normal.  BS equal and clear bilateral without rales, rhonci, wheezing or stridor. Cardio: Heart sounds are normal with regular rate and rhythm and no murmurs, rubs or gallops. Peripheral pulses are normal and equal bilaterally without edema. No aortic or femoral bruits. Chest: symmetric with normal excursions and percussion.  Abdomen: Flat, soft, with bowl sounds. Nontender, no guarding, rebound, hernias, masses, or organomegaly.  Lymphatics: Non tender without lymphadenopathy.  Genitourinary: No hernias.Testes nl. DRE - prostate nl for age - smooth & firm w/o nodules. Musculoskeletal: Full ROM all peripheral extremities, joint stability, 5/5 strength, and normal gait. Skin: Warm and dry without rashes,  lesions, cyanosis, clubbing or  ecchymosis.  Neuro: Cranial nerves intact, reflexes equal bilaterally. Normal muscle tone, no cerebellar symptoms. Sensation intact.  Pysch: Awake and oriented X 3 with normal affect, insight and judgment appropriate.   Assessment and Plan  1. Essential hypertension  - Microalbumin / creatinine urine ratio - EKG 12-Lead - [place], RETROPERITNL ABD,  LTD  2. Hyperlipidemia  - Lipid panel  3. Prediabetes  - Hemoglobin A1c - Insulin, fasting  4. Vitamin D deficiency  - Vit D  25 hydroxy (rtn osteoporosis monitoring)  5. Testosterone deficiency   6. Other fatigue  - Vitamin B12 - Testosterone - TSH  7. Medication management  - Urine Microscopic - CBC with Differential/Platelet - BASIC METABOLIC PANEL WITH GFR - Hepatic function panel - Magnesium  8. Screening for rectal cancer  - POC Hemoccult Bld/Stl (3-Cd Home Screen); Future  9. Prostate cancer screening  - PSA  10. Screening examination for pulmonary tuberculosis  - PPD  11. Need for prophylactic vaccination against Streptococcus pneumoniae (pneumococcus)  - Pneumococcal conjugate vaccine 13-valent  12. Need for prophylactic vaccination with tetanus-diphtheria (TD)  - Tdap vaccine greater than or equal to 7yo IM   Continue prudent diet as discussed, weight control, BP monitoring, regular exercise, and medications as discussed.  Discussed med effects and SE's. Routine screening labs and tests as requested  with regular follow-up as recommended.

## 2014-08-13 NOTE — Patient Instructions (Signed)

## 2014-08-13 NOTE — Progress Notes (Deleted)
   Subjective:    Patient ID: Brett Perez, male    DOB: 11/13/1950, 64 y.o.   MRN: [id number]  HPI    Review of Systems     Objective:   Physical Exam        Assessment & Plan:

## 2014-08-14 LAB — TSH: TSH: 2.106 u[IU]/mL (ref 0.350–4.500)

## 2014-08-14 LAB — VITAMIN D 25 HYDROXY (VIT D DEFICIENCY, FRACTURES): VIT D 25 HYDROXY: 56 ng/mL (ref 30–100)

## 2014-08-14 LAB — URINALYSIS, MICROSCOPIC ONLY
Bacteria, UA: NONE SEEN
Casts: NONE SEEN
Crystals: NONE SEEN
SQUAMOUS EPITHELIAL / LPF: NONE SEEN

## 2014-08-14 LAB — MICROALBUMIN / CREATININE URINE RATIO: Creatinine, Urine: 15.8 mg/dL

## 2014-08-14 LAB — VITAMIN B12: Vitamin B-12: 381 pg/mL (ref 211–911)

## 2014-08-14 LAB — PSA: PSA: 0.98 ng/mL (ref <=4.00)

## 2014-08-14 LAB — INSULIN, FASTING: Insulin fasting, serum: 4.4 u[IU]/mL (ref 2.0–19.6)

## 2014-08-14 LAB — TESTOSTERONE: TESTOSTERONE: 329 ng/dL (ref 300–890)

## 2014-08-16 LAB — TB SKIN TEST
Induration: 0 mm
TB Skin Test: NEGATIVE

## 2014-08-26 ENCOUNTER — Other Ambulatory Visit: Payer: Self-pay | Admitting: *Deleted

## 2014-08-26 DIAGNOSIS — Z1212 Encounter for screening for malignant neoplasm of rectum: Secondary | ICD-10-CM

## 2014-08-26 LAB — POC HEMOCCULT BLD/STL (HOME/3-CARD/SCREEN)
FECAL OCCULT BLD: NEGATIVE
FECAL OCCULT BLD: NEGATIVE
Fecal Occult Blood, POC: NEGATIVE

## 2014-10-25 ENCOUNTER — Other Ambulatory Visit: Payer: Self-pay | Admitting: Internal Medicine

## 2014-11-09 ENCOUNTER — Other Ambulatory Visit: Payer: Self-pay | Admitting: Internal Medicine

## 2014-11-09 DIAGNOSIS — I10 Essential (primary) hypertension: Secondary | ICD-10-CM

## 2014-11-09 DIAGNOSIS — R7989 Other specified abnormal findings of blood chemistry: Secondary | ICD-10-CM

## 2014-11-16 ENCOUNTER — Ambulatory Visit (INDEPENDENT_AMBULATORY_CARE_PROVIDER_SITE_OTHER): Payer: BLUE CROSS/BLUE SHIELD | Admitting: Internal Medicine

## 2014-11-16 ENCOUNTER — Encounter: Payer: Self-pay | Admitting: Internal Medicine

## 2014-11-16 VITALS — BP 116/64 | HR 72 | Temp 98.2°F | Resp 16 | Ht 73.5 in | Wt 201.0 lb

## 2014-11-16 DIAGNOSIS — M7021 Olecranon bursitis, right elbow: Secondary | ICD-10-CM

## 2014-11-16 MED ORDER — MELOXICAM 15 MG PO TABS: 7.5000 mg | ORAL_TABLET | Freq: Every day | ORAL | Status: DC

## 2014-11-16 NOTE — Patient Instructions (Signed)
Olecranon Bursitis Bursitis is swelling and soreness (inflammation) of a fluid-filled sac (bursa) that covers and protects a joint. Olecranon bursitis occurs over the elbow.  CAUSES Bursitis can be caused by injury, overuse of the joint, arthritis, or infection.  SYMPTOMS   Tenderness, swelling, warmth, or redness over the elbow.  Elbow pain with movement. This is greater with bending the elbow.  Squeaking sound when the bursa is rubbed or moved.  Increasing size of the bursa without pain or discomfort.  Fever with increasing pain and swelling if the bursa becomes infected. HOME CARE INSTRUCTIONS   Put ice on the affected area.  Put ice in a plastic bag.  Place a towel between your skin and the bag.  Leave the ice on for 15-20 minutes each hour while awake. Do this for the first 2 days.  When resting, elevate your elbow above the level of your heart. This helps reduce swelling.  Continue to put the joint through a full range of motion 4 times per day. Rest the injured joint at other times. When the pain lessens, begin normal slow movements and usual activities.  Only take over-the-counter or prescription medicines for pain, discomfort, or fever as directed by your caregiver.  Reduce your intake of milk and related dairy products (cheese, yogurt). They may make your condition worse. SEEK IMMEDIATE MEDICAL CARE IF:   Your pain increases even during treatment.  You have a fever.  You have heat and inflammation over the bursa and elbow.  You have a red line that goes up your arm.  You have pain with movement of your elbow. MAKE SURE YOU:   Understand these instructions.  Will watch your condition.  Will get help right away if you are not doing well or get worse. Document Released: 07/25/2006 Document Revised: 09/17/2011 Document Reviewed: 06/10/2007 Exit[employer] Patient Information 2015 [employer]. This information is not intended to replace advice given to you by your  health care provider. Make sure you discuss any questions you have with your health care provider.  

## 2014-11-16 NOTE — Progress Notes (Signed)
   Subjective:    Patient ID: Brett Perez, male    DOB: 08/02/1950, 64 y.o.   MRN: [id number]  HPI  Patient presents to the office for right elbow swelling which has been going on for the past 3 weeks.  He reports that there was no injury he can think of.  It has not been hot red or warm to the touch.  It is worse with doing exercise with the joint.  He has been trying to rest but has not been taking any medications.  He has tried ice minimally.  No braces while working out. No history.     Review of Systems  Constitutional: Negative for fever, chills and fatigue.  Gastrointestinal: Negative for nausea and vomiting.  Musculoskeletal: Positive for joint swelling and arthralgias.       Objective:   Physical Exam  Constitutional: He is oriented to person, place, and time. He appears well-developed and well-nourished. No distress.  Eyes: Conjunctivae are normal. No scleral icterus.  Neck: Normal range of motion. Neck supple. No JVD present. No thyromegaly present.  Cardiovascular: Normal rate, regular rhythm, normal heart sounds and intact distal pulses.  Exam reveals no gallop and no friction rub.   No murmur heard. Pulses:      Radial pulses are 2+ on the right side, and 2+ on the left side.  Pulmonary/Chest: Effort normal and breath sounds normal. No respiratory distress. He has no wheezes. He has no rales. He exhibits no tenderness.  Musculoskeletal:       Right elbow: He exhibits swelling. He exhibits normal range of motion, no effusion, no deformity and no laceration. No tenderness found. No radial head, no medial epicondyle, no lateral epicondyle and no olecranon process tenderness noted.       Right hand: Normal sensation noted. Decreased sensation is not present in the ulnar distribution, is not present in the medial distribution and is not present in the radial distribution. Normal strength noted. He exhibits no finger abduction, no thumb/finger opposition and no wrist extension  trouble.  Golf ball sized swelling on the olecernon process of the right elbow without tenderness, warmth, or redness.  Full ROM.  5/5 strength with pronation, suppination, flexion and extension.  Lymphadenopathy:    He has no cervical adenopathy.  Neurological: He is alert and oriented to person, place, and time.  Skin: He is not diaphoretic.  Nursing note and vitals reviewed.         Assessment & Plan:    1. Bursitis of elbow, right -bracing -rest -ice -elevation - meloxicam (MOBIC) 15 MG tablet; Take 0.5 tablets (7.5 mg total) by mouth daily.  Dispense: 30 tablet; Refill: 2

## 2014-11-23 ENCOUNTER — Ambulatory Visit (INDEPENDENT_AMBULATORY_CARE_PROVIDER_SITE_OTHER): Payer: BLUE CROSS/BLUE SHIELD | Admitting: Physician Assistant

## 2014-11-23 ENCOUNTER — Encounter: Payer: Self-pay | Admitting: Physician Assistant

## 2014-11-23 VITALS — BP 110/64 | HR 70 | Temp 98.0°F | Resp 16 | Ht 73.5 in | Wt 205.0 lb

## 2014-11-23 DIAGNOSIS — E559 Vitamin D deficiency, unspecified: Secondary | ICD-10-CM

## 2014-11-23 DIAGNOSIS — E349 Endocrine disorder, unspecified: Secondary | ICD-10-CM

## 2014-11-23 DIAGNOSIS — R7989 Other specified abnormal findings of blood chemistry: Secondary | ICD-10-CM

## 2014-11-23 DIAGNOSIS — E785 Hyperlipidemia, unspecified: Secondary | ICD-10-CM

## 2014-11-23 DIAGNOSIS — Z79899 Other long term (current) drug therapy: Secondary | ICD-10-CM

## 2014-11-23 DIAGNOSIS — I10 Essential (primary) hypertension: Secondary | ICD-10-CM

## 2014-11-23 DIAGNOSIS — E291 Testicular hypofunction: Secondary | ICD-10-CM

## 2014-11-23 MED ORDER — TESTOSTERONE 20.25 MG/ACT (1.62%) TD GEL: TRANSDERMAL | Status: DC

## 2014-11-23 NOTE — Progress Notes (Signed)
Assessment and Plan:  1. Hypertension -Continue medication, monitor blood pressure at home. Continue DASH diet.  Reminder to go to the ER if any CP, SOB, nausea, dizziness, severe HA, changes vision/speech, left arm numbness and tingling and jaw pain.  2. Cholesterol -Continue diet and exercise. Check cholesterol.   3. Prediabetes  -Continue diet and exercise. Check A1C  4. Vitamin D Def - check level and continue medications.   5. Hypogonadism-  continue replacement therapy, increase to 3 pumps a day, check testosterone levels as needed.    Continue diet and meds as discussed. Further disposition pending results of labs. Over 30 minutes of exam, counseling, chart review, and critical decision making was performed  HPI 64 y.o. male  presents for 3 month follow up on hypertension, cholesterol, hypogonadism, and vitamin D deficiency.   His blood pressure has been controlled at home, today their BP is BP: 110/64 mmHg  He does workout. He denies chest pain, shortness of breath, dizziness.  He is on cholesterol medication and denies myalgias. His cholesterol is not at goal. The cholesterol last visit was:   Lab Results  Component Value Date   CHOL 164 08/13/2014   HDL 75 08/13/2014   LDLCALC 76 08/13/2014   TRIG 64 08/13/2014   CHOLHDL 2.2 08/13/2014  Last A1C in the office was:  Lab Results  Component Value Date   HGBA1C 5.2 08/13/2014  Patient is on Vitamin D supplement.   Lab Results  Component Value Date   VD25OH 4456 08/13/2014    He has a history of testosterone deficiency and is on testosterone replacement with the gel, two pumps daily a day.  He states that the testosterone helps with his energy, libido, muscle mass but not much of a difference. Lab Results  Component Value Date   TESTOSTERONE 329 08/13/2014  He was seen 11/16/2014 by Courteny Forcucci for olecranon bursitis and states it is doing better with meloxicam. No GERD symptoms.    Current Medications:   Current Outpatient Prescriptions on File Prior to Visit  Medication Sig Dispense Refill  . ANDROGEL PUMP 20.25 MG/ACT (1.62%) GEL APPLY ONE PUMP TO EACH ARM AS DIRECTED 75 g 5  . aspirin 81 MG tablet Take 81 mg by mouth 3 (three) times a week.    Marland Kitchen. atenolol (TENORMIN) 100 MG tablet TAKE 1 TABLET BY MOUTH EVERY DAY 90 tablet 1  . Cholecalciferol (VITAMIN D PO) Take by mouth. Takes 4000 units daily    . glucosamine-chondroitin 500-400 MG tablet Take 1 tablet by mouth 2 (two) times daily.    . meloxicam (MOBIC) 15 MG tablet Take 0.5 tablets (7.5 mg total) by mouth daily. 30 tablet 2  . Multiple Vitamin (MULTIVITAMIN) tablet Take 1 tablet by mouth daily.    . Omega-3 Fatty Acids (FISH OIL) 1000 MG CAPS Take by mouth daily.    . rosuvastatin (CRESTOR) 40 MG tablet Take 1 tablet (40 mg total) by mouth daily. 30 tablet 3  . vitamin C (ASCORBIC ACID) 500 MG tablet Take 500 mg by mouth daily.     No current facility-administered medications on file prior to visit.   Medical History:  Past Medical History  Diagnosis Date  . Hyperlipidemia   . Hypertension   . Vitamin D deficiency   . Other testicular hypofunction    Allergies: No Known Allergies   Review of Systems:  Review of Systems  Constitutional: Negative.   HENT: Negative.   Eyes: Negative.   Respiratory: Negative.  Cardiovascular: Negative.   Gastrointestinal: Negative.   Genitourinary: Negative.   Musculoskeletal: Negative.   Skin: Negative.   Neurological: Negative.   Endo/Heme/Allergies: Negative.   Psychiatric/Behavioral: Negative.     Family history- Review and unchanged Social history- Review and unchanged Physical Exam: BP 110/64 mmHg  Pulse 70  Temp(Src) 98 F (36.7 C) (Temporal)  Resp 16  Ht 6' 1.5" (1.867 m)  Wt 205 lb (92.987 kg)  BMI 26.68 kg/m2 Wt Readings from Last 3 Encounters:  11/23/14 205 lb (92.987 kg)  11/16/14 201 lb (91.173 kg)  08/13/14 206 lb 3.2 oz (93.532 kg)   General Appearance:  Well nourished, in no apparent distress. Eyes: PERRLA, EOMs, conjunctiva no swelling or erythema Sinuses: No Frontal/maxillary tenderness ENT/Mouth: Ext aud canals clear, TMs without erythema, bulging. No erythema, swelling, or exudate on post pharynx.  Tonsils not swollen or erythematous. Hearing normal.  Neck: Supple, thyroid normal.  Respiratory: Respiratory effort normal, BS equal bilaterally without rales, rhonchi, wheezing or stridor.  Cardio: RRR with no MRGs. Brisk peripheral pulses without edema.  Abdomen: Soft, + BS,  Non tender, no guarding, rebound, hernias, masses. Lymphatics: Non tender without lymphadenopathy.  Musculoskeletal: Full ROM, 5/5 strength, Normal gait Skin: Warm, dry without rashes, lesions, ecchymosis.  Neuro: Cranial nerves intact. Normal muscle tone, no cerebellar symptoms. Psych: Awake and oriented X 3, normal affect, Insight and Judgment appropriate.    Quentin Mullingollier, Amanda, PA-C 4:52 PM [facility]

## 2014-11-23 NOTE — Patient Instructions (Addendum)

## 2014-11-24 LAB — CBC WITH DIFFERENTIAL/PLATELET
BASOS PCT: 0 % (ref 0–1)
Basophils Absolute: 0 10*3/uL (ref 0.0–0.1)
Eosinophils Absolute: 0.1 10*3/uL (ref 0.0–0.7)
Eosinophils Relative: 2 % (ref 0–5)
HCT: 43.2 % (ref 39.0–52.0)
Hemoglobin: 14.9 g/dL (ref 13.0–17.0)
Lymphocytes Relative: 43 % (ref 12–46)
Lymphs Abs: 3 10*3/uL (ref 0.7–4.0)
MCH: 30.7 pg (ref 26.0–34.0)
MCHC: 34.5 g/dL (ref 30.0–36.0)
MCV: 89.1 fL (ref 78.0–100.0)
MPV: 9.2 fL (ref 8.6–12.4)
Monocytes Absolute: 0.6 10*3/uL (ref 0.1–1.0)
Monocytes Relative: 9 % (ref 3–12)
NEUTROS ABS: 3.2 10*3/uL (ref 1.7–7.7)
Neutrophils Relative %: 46 % (ref 43–77)
Platelets: 244 10*3/uL (ref 150–400)
RBC: 4.85 MIL/uL (ref 4.22–5.81)
RDW: 14 % (ref 11.5–15.5)
WBC: 6.9 10*3/uL (ref 4.0–10.5)

## 2014-11-24 LAB — HEPATIC FUNCTION PANEL
ALK PHOS: 46 U/L (ref 39–117)
ALT: 35 U/L (ref 0–53)
AST: 28 U/L (ref 0–37)
Albumin: 4.1 g/dL (ref 3.5–5.2)
BILIRUBIN DIRECT: 0.1 mg/dL (ref 0.0–0.3)
BILIRUBIN INDIRECT: 0.4 mg/dL (ref 0.2–1.2)
BILIRUBIN TOTAL: 0.5 mg/dL (ref 0.2–1.2)
Total Protein: 6.6 g/dL (ref 6.0–8.3)

## 2014-11-24 LAB — BASIC METABOLIC PANEL WITH GFR
BUN: 18 mg/dL (ref 6–23)
CHLORIDE: 105 meq/L (ref 96–112)
CO2: 24 mEq/L (ref 19–32)
CREATININE: 0.88 mg/dL (ref 0.50–1.35)
Calcium: 8.9 mg/dL (ref 8.4–10.5)
GFR, Est African American: >89 mL/min
GFR, Est Non African American: >89 mL/min
Glucose, Bld: 100 mg/dL — ABNORMAL HIGH (ref 70–99)
POTASSIUM: 4.2 meq/L (ref 3.5–5.3)
Sodium: 138 mEq/L (ref 135–145)

## 2014-11-24 LAB — LIPID PANEL
CHOL/HDL RATIO: 2.3 ratio
CHOLESTEROL: 170 mg/dL (ref 0–200)
HDL: 75 mg/dL (ref >=40)
LDL Cholesterol: 67 mg/dL (ref 0–99)
TRIGLYCERIDES: 138 mg/dL (ref <150)
VLDL: 28 mg/dL (ref 0–40)

## 2014-11-24 LAB — VITAMIN D 25 HYDROXY (VIT D DEFICIENCY, FRACTURES): Vit D, 25-Hydroxy: 52 ng/mL (ref 30–100)

## 2014-11-24 LAB — MAGNESIUM: Magnesium: 2.2 mg/dL (ref 1.5–2.5)

## 2014-11-24 LAB — TSH: TSH: 1.11 u[IU]/mL (ref 0.350–4.500)

## 2015-03-02 ENCOUNTER — Encounter: Payer: Self-pay | Admitting: Internal Medicine

## 2015-03-02 ENCOUNTER — Ambulatory Visit (INDEPENDENT_AMBULATORY_CARE_PROVIDER_SITE_OTHER): Payer: BLUE CROSS/BLUE SHIELD | Admitting: Physician Assistant

## 2015-03-02 VITALS — BP 114/76 | HR 72 | Temp 97.3°F | Resp 16 | Ht 73.5 in | Wt 205.8 lb

## 2015-03-02 DIAGNOSIS — E785 Hyperlipidemia, unspecified: Secondary | ICD-10-CM

## 2015-03-02 DIAGNOSIS — X32XXXA Exposure to sunlight, initial encounter: Secondary | ICD-10-CM

## 2015-03-02 DIAGNOSIS — Z79899 Other long term (current) drug therapy: Secondary | ICD-10-CM

## 2015-03-02 DIAGNOSIS — E559 Vitamin D deficiency, unspecified: Secondary | ICD-10-CM | POA: Diagnosis not present

## 2015-03-02 DIAGNOSIS — R7309 Other abnormal glucose: Secondary | ICD-10-CM

## 2015-03-02 DIAGNOSIS — E291 Testicular hypofunction: Secondary | ICD-10-CM | POA: Diagnosis not present

## 2015-03-02 DIAGNOSIS — I10 Essential (primary) hypertension: Secondary | ICD-10-CM | POA: Diagnosis not present

## 2015-03-02 DIAGNOSIS — L570 Actinic keratosis: Secondary | ICD-10-CM

## 2015-03-02 DIAGNOSIS — E349 Endocrine disorder, unspecified: Secondary | ICD-10-CM

## 2015-03-02 DIAGNOSIS — R7303 Prediabetes: Secondary | ICD-10-CM

## 2015-03-02 MED ORDER — FLUOROURACIL 5 % EX CREA: TOPICAL_CREAM | Freq: Two times a day (BID) | CUTANEOUS | Status: DC

## 2015-03-02 NOTE — Progress Notes (Signed)
Assessment and Plan:  1. Hypertension -Continue medication, monitor blood pressure at home. Continue DASH diet.  Reminder to go to the ER if any CP, SOB, nausea, dizziness, severe HA, changes vision/speech, left arm numbness and tingling and jaw pain.  2. Cholesterol -Continue diet and exercise. Check cholesterol.   3. Prediabetes  -Continue diet and exercise. Check A1C  4. Vitamin D Def - check level and continue medications.   5. Hypogonadism-  continue replacement therapy, increase to 3 pumps a day, check testosterone levels as needed.   6. Actinic keratosis Cream sent in, see orders   Continue diet and meds as discussed. Further disposition pending results of labs. Over 30 minutes of exam, counseling, chart review, and critical decision making was performed  HPI 64 y.o. male  presents for 3 month follow up on hypertension, cholesterol, hypogonadism, and vitamin D deficiency.   His blood pressure has been controlled at home, today their BP is BP: 114/76 mmHg  He does workout. He denies chest pain, shortness of breath, dizziness.  He is on cholesterol medication and denies myalgias. His cholesterol is not at goal. The cholesterol last visit was:   Lab Results  Component Value Date   CHOL 170 11/23/2014   HDL 75 11/23/2014   LDLCALC 67 11/23/2014   TRIG 138 11/23/2014   CHOLHDL 2.3 11/23/2014  Last A1C in the office was:  Lab Results  Component Value Date   HGBA1C 5.2 08/13/2014  Patient is on Vitamin D supplement.   Lab Results  Component Value Date   VD25OH 3 11/23/2014    He has a history of testosterone deficiency and is on testosterone replacement with the gel, two pumps daily a day.  He states that the testosterone helps with his energy, libido, muscle mass but not much of a difference. Lab Results  Component Value Date   TESTOSTERONE 329 08/13/2014     Current Medications:  Current Outpatient Prescriptions on File Prior to Visit  Medication Sig Dispense  Refill  . aspirin 81 MG tablet Take 81 mg by mouth 3 (three) times a week.    Marland Kitchen atenolol (TENORMIN) 100 MG tablet TAKE 1 TABLET BY MOUTH EVERY DAY 90 tablet 1  . Cholecalciferol (VITAMIN D PO) Take by mouth. Takes 4000 units daily    . glucosamine-chondroitin 500-400 MG tablet Take 1 tablet by mouth 2 (two) times daily.    . meloxicam (MOBIC) 15 MG tablet Take 0.5 tablets (7.5 mg total) by mouth daily. 30 tablet 2  . Multiple Vitamin (MULTIVITAMIN) tablet Take 1 tablet by mouth daily.    . Omega-3 Fatty Acids (FISH OIL) 1000 MG CAPS Take by mouth daily.    . rosuvastatin (CRESTOR) 40 MG tablet Take 1 tablet (40 mg total) by mouth daily. 30 tablet 3  . Testosterone (ANDROGEL PUMP) 20.25 MG/ACT (1.62%) GEL Apply two pumps each arm daily as directed 75 g 0  . vitamin C (ASCORBIC ACID) 500 MG tablet Take 500 mg by mouth daily.     No current facility-administered medications on file prior to visit.   Medical History:  Past Medical History  Diagnosis Date  . Hyperlipidemia   . Hypertension   . Vitamin D deficiency   . Other testicular hypofunction    Allergies: No Known Allergies   Review of Systems:  Review of Systems  Constitutional: Negative.   HENT: Negative.   Eyes: Negative.   Respiratory: Negative.   Cardiovascular: Negative.   Gastrointestinal: Negative.   Genitourinary: Negative.  Musculoskeletal: Negative.   Skin: Negative.   Neurological: Negative.   Endo/Heme/Allergies: Negative.   Psychiatric/Behavioral: Negative.     Family history- Review and unchanged Social history- Review and unchanged Physical Exam: BP 114/76 mmHg  Pulse 72  Temp(Src) 97.3 F (36.3 C)  Resp 16  Ht 6' 1.5" (1.867 m)  Wt 205 lb 12.8 oz (93.35 kg)  BMI 26.78 kg/m2 Wt Readings from Last 3 Encounters:  03/02/15 205 lb 12.8 oz (93.35 kg)  11/23/14 205 lb (92.987 kg)  11/16/14 201 lb (91.173 kg)   General Appearance: Well nourished, in no apparent distress. Eyes: PERRLA, EOMs,  conjunctiva no swelling or erythema Sinuses: No Frontal/maxillary tenderness ENT/Mouth: Ext aud canals clear, TMs without erythema, bulging. No erythema, swelling, or exudate on post pharynx.  Tonsils not swollen or erythematous. Hearing normal.  Neck: Supple, thyroid normal.  Respiratory: Respiratory effort normal, BS equal bilaterally without rales, rhonchi, wheezing or stridor.  Cardio: RRR with no MRGs. Brisk peripheral pulses without edema.  Abdomen: Soft, + BS,  Non tender, no guarding, rebound, hernias, masses. Lymphatics: Non tender without lymphadenopathy.  Musculoskeletal: Full ROM, 5/5 strength, Normal gait Skin: Warm, dry without rashes, lesions, ecchymosis.  Neuro: Cranial nerves intact. Normal muscle tone, no cerebellar symptoms. Psych: Awake and oriented X 3, normal affect, Insight and Judgment appropriate.    Brett Mulling, PA-C 5:02 PM [name]

## 2015-03-02 NOTE — Patient Instructions (Signed)
Actinic Keratosis Actinic keratosis is a precancerous growth on the skin. This means it could develop into skin cancer if it is not treated. About 1% of actinic keratoses turn into skin cancer within a year. It is important to have all such growths removed to prevent them from developing into skin cancer. CAUSES  Actinic keratosis is caused by getting too much ultraviolet (UV) radiation from the sun or other UV light sources. RISK FACTORS Factors that increase your chances of getting actinic keratosis include:  Having light-colored skin and blue eyes.  Having blonde or red hair.  Spending a lot of time in the sun.  Age. The risk of actinic keratosis increases with age. SYMPTOMS  Actinic keratosis growths look like scaly, rough spots of skin. They can be as small as a pinhead or as big as a quarter. They may itch, hurt, or feel sensitive. Sometimes there is a little tag of pink or gray skin growing off them. In some cases, actinic keratoses are easier felt than seen. They do not go away with the use of moisturizing lotions or creams. Actinic keratoses appear most often on areas of skin that get a lot of sun exposure. These areas include the:  Scalp.  Face.  Ears.  Lips.  Upper back.  Backs of the hands.  Forearms. DIAGNOSIS  Your health care provider can usually tell what is wrong by performing a physical exam. A tissue sample (biopsy) may also be taken and examined under a microscope. TREATMENT  Actinic keratosis can be treated several ways. Most treatments can be done in your health care provider's office. Treatment options may include:  Curettage. A tool is used to gently scrape off the growth.  Cryosurgery. Liquid nitrogen is applied to the growth to freeze it. The growth eventually falls off the skin.  Medicated creams, such as 5-fluorouracil or imiquimod. The medicine destroys the cells in the growth.  Chemical peels. Chemicals are applied to the growth and the outer  layers of skin are peeled off.  Photodynamic therapy. A drug that makes your skin more sensitive to light is applied to the skin. A strong, blue light is aimed at the skin and destroys the growth. PREVENTION  To prevent future sun damage:  Try to avoid the sun between 10:00 a.m. and 4:00 p.m. when it is the strongest.  Use a sunscreen or sunblock with SPF 30 or greater.  Apply sunscreen at least 30 minutes before exposure to the sun.  Always wear protective hats, clothing, and sunglasses with UV protection.  Avoid medicines, herbs, and foods that increase your sensitivity to sunlight.  Avoid tanning beds. HOME CARE INSTRUCTIONS   If your skin was covered with a bandage, change and remove the bandage as directed by your health care provider.  Keep the treated area dry as directed by your health care provider.  Apply any creams as prescribed by your health care provider. Follow the directions carefully.  Check your skin regularly for any changes.  Visit a skin doctor (dermatologist) every year for a skin exam. SEEK MEDICAL CARE IF:   Your skin does not heal and becomes irritated, red, or bleeds.  You notice any changes or new growths on your skin. Document Released: 09/21/2008 Document Revised: 11/09/2013 Document Reviewed: 08/06/2011 Exit[employer] Patient Information 2015 [employer]. This information is not intended to replace advice given to you by your health care provider. Make sure you discuss any questions you have with your health care provider.  

## 2015-03-03 LAB — BASIC METABOLIC PANEL WITH GFR
BUN: 16 mg/dL (ref 7–25)
CHLORIDE: 105 mmol/L (ref 98–110)
CO2: 24 mmol/L (ref 20–31)
CREATININE: 0.86 mg/dL (ref 0.70–1.25)
Calcium: 8.7 mg/dL (ref 8.6–10.3)
GFR, Est Non African American: >89 mL/min (ref >=60)
Glucose, Bld: 90 mg/dL (ref 65–99)
Potassium: 4.1 mmol/L (ref 3.5–5.3)
SODIUM: 139 mmol/L (ref 135–146)

## 2015-03-03 LAB — CBC WITH DIFFERENTIAL/PLATELET
BASOS PCT: 0 % (ref 0–1)
Basophils Absolute: 0 10*3/uL (ref 0.0–0.1)
EOS ABS: 0.1 10*3/uL (ref 0.0–0.7)
Eosinophils Relative: 2 % (ref 0–5)
HCT: 42.7 % (ref 39.0–52.0)
Hemoglobin: 15.3 g/dL (ref 13.0–17.0)
Lymphocytes Relative: 47 % — ABNORMAL HIGH (ref 12–46)
Lymphs Abs: 3.1 10*3/uL (ref 0.7–4.0)
MCH: 31.3 pg (ref 26.0–34.0)
MCHC: 35.8 g/dL (ref 30.0–36.0)
MCV: 87.3 fL (ref 78.0–100.0)
MONOS PCT: 9 % (ref 3–12)
MPV: 9.4 fL (ref 8.6–12.4)
Monocytes Absolute: 0.6 10*3/uL (ref 0.1–1.0)
Neutro Abs: 2.8 10*3/uL (ref 1.7–7.7)
Neutrophils Relative %: 42 % — ABNORMAL LOW (ref 43–77)
PLATELETS: 241 10*3/uL (ref 150–400)
RBC: 4.89 MIL/uL (ref 4.22–5.81)
RDW: 13.8 % (ref 11.5–15.5)
WBC: 6.7 10*3/uL (ref 4.0–10.5)

## 2015-03-03 LAB — HEPATIC FUNCTION PANEL
ALBUMIN: 4.2 g/dL (ref 3.6–5.1)
ALT: 29 U/L (ref 9–46)
AST: 25 U/L (ref 10–35)
Alkaline Phosphatase: 50 U/L (ref 40–115)
BILIRUBIN TOTAL: 0.5 mg/dL (ref 0.2–1.2)
Bilirubin, Direct: 0.1 mg/dL (ref <=0.2)
Indirect Bilirubin: 0.4 mg/dL (ref 0.2–1.2)
TOTAL PROTEIN: 6.4 g/dL (ref 6.1–8.1)

## 2015-03-03 LAB — LIPID PANEL
CHOLESTEROL: 161 mg/dL (ref 125–200)
HDL: 73 mg/dL (ref >=40)
LDL Cholesterol: 59 mg/dL (ref <130)
TRIGLYCERIDES: 147 mg/dL (ref <150)
Total CHOL/HDL Ratio: 2.2 Ratio (ref <=5.0)
VLDL: 29 mg/dL (ref <30)

## 2015-03-03 LAB — VITAMIN D 25 HYDROXY (VIT D DEFICIENCY, FRACTURES): VIT D 25 HYDROXY: 56 ng/mL (ref 30–100)

## 2015-03-03 LAB — HEMOGLOBIN A1C
Hgb A1c MFr Bld: 5.1 % (ref <5.7)
Mean Plasma Glucose: 100 mg/dL (ref <117)

## 2015-03-03 LAB — MAGNESIUM: MAGNESIUM: 2.1 mg/dL (ref 1.5–2.5)

## 2015-03-03 LAB — TESTOSTERONE: TESTOSTERONE: 239 ng/dL — AB (ref 300–890)

## 2015-03-03 LAB — INSULIN, FASTING: INSULIN FASTING, SERUM: 18.3 u[IU]/mL (ref 2.0–19.6)

## 2015-03-03 LAB — TSH: TSH: 1.233 u[IU]/mL (ref 0.350–4.500)

## 2015-05-11 ENCOUNTER — Other Ambulatory Visit: Payer: Self-pay | Admitting: Internal Medicine

## 2015-05-12 NOTE — Telephone Encounter (Signed)
Rx called into [facility] [place]

## 2015-06-07 ENCOUNTER — Ambulatory Visit: Payer: Self-pay | Admitting: Internal Medicine

## 2015-06-21 ENCOUNTER — Ambulatory Visit (INDEPENDENT_AMBULATORY_CARE_PROVIDER_SITE_OTHER): Payer: BLUE CROSS/BLUE SHIELD | Admitting: Internal Medicine

## 2015-06-21 ENCOUNTER — Encounter: Payer: Self-pay | Admitting: Internal Medicine

## 2015-06-21 DIAGNOSIS — R7303 Prediabetes: Secondary | ICD-10-CM | POA: Diagnosis not present

## 2015-06-21 DIAGNOSIS — Z79899 Other long term (current) drug therapy: Secondary | ICD-10-CM | POA: Diagnosis not present

## 2015-06-21 DIAGNOSIS — E559 Vitamin D deficiency, unspecified: Secondary | ICD-10-CM

## 2015-06-21 DIAGNOSIS — E291 Testicular hypofunction: Secondary | ICD-10-CM

## 2015-06-21 DIAGNOSIS — E785 Hyperlipidemia, unspecified: Secondary | ICD-10-CM

## 2015-06-21 DIAGNOSIS — Z23 Encounter for immunization: Secondary | ICD-10-CM | POA: Diagnosis not present

## 2015-06-21 DIAGNOSIS — I10 Essential (primary) hypertension: Secondary | ICD-10-CM | POA: Diagnosis not present

## 2015-06-21 DIAGNOSIS — E349 Endocrine disorder, unspecified: Secondary | ICD-10-CM

## 2015-06-21 NOTE — Progress Notes (Signed)
Patient ID: Sonny MastersRobert G Atkin, male   DOB: 02/09/1951, 64 y.o.   MRN: [id number]  Assessment and Plan:  Hypertension:  -Continue medication,  -monitor blood pressure at home.  -Continue DASH diet.   -Reminder to go to the ER if any CP, SOB, nausea, dizziness, severe HA, changes vision/speech, left arm numbness and tingling, and jaw pain.  Cholesterol: -Continue diet and exercise.  -Check cholesterol.   Pre-diabetes: -Continue diet and exercise.  -Check A1C  Vitamin D Def: -check level -continue medications.   Testosterone deficiency -androderm -recheck next visit   Continue diet and meds as discussed. Further disposition pending results of labs.  HPI 64 y.o. male  presents for 3 month follow up with hypertension, hyperlipidemia, prediabetes and vitamin D.   His blood pressure has been controlled at home, today their BP is BP: 122/70 mmHg.   He does workout. He denies chest pain, shortness of breath, dizziness.   He is on cholesterol medication and denies myalgias. His cholesterol is at goal. The cholesterol last visit was:   Lab Results  Component Value Date   CHOL 161 03/02/2015   HDL 73 03/02/2015   LDLCALC 59 03/02/2015   TRIG 147 03/02/2015   CHOLHDL 2.2 03/02/2015     He has been working on diet and exercise for prediabetes, and denies foot ulcerations, hyperglycemia, hypoglycemia , increased appetite, nausea, paresthesia of the feet, polydipsia, polyuria, visual disturbances, vomiting and weight loss. Last A1C in the office was:  Lab Results  Component Value Date   HGBA1C 5.1 03/02/2015    Patient is on Vitamin D supplement.  Lab Results  Component Value Date   VD25OH 6156 03/02/2015     He reports that he is using his androgel but he had a long delay and he has been off it for the past month.  He reports that the new plan has now accepted it and he should receive the medication soon.    Current Medications:  Current Outpatient Prescriptions on File Prior to  Visit  Medication Sig Dispense Refill  . ANDROGEL PUMP 20.25 MG/ACT (1.62%) GEL APPLY ONE PUMP TO EACH ARM AS DIRECTED 75 g 1  . aspirin 81 MG tablet Take 81 mg by mouth 3 (three) times a week.    Marland Kitchen. atenolol (TENORMIN) 100 MG tablet TAKE 1 TABLET BY MOUTH EVERY DAY 90 tablet 1  . Cholecalciferol (VITAMIN D PO) Take by mouth. Takes 4000 units daily    . fluorouracil (EFUDEX) 5 % cream Apply topically 2 (two) times daily. 40 g 0  . glucosamine-chondroitin 500-400 MG tablet Take 1 tablet by mouth 2 (two) times daily.    . meloxicam (MOBIC) 15 MG tablet Take 0.5 tablets (7.5 mg total) by mouth daily. 30 tablet 2  . Multiple Vitamin (MULTIVITAMIN) tablet Take 1 tablet by mouth daily.    . Omega-3 Fatty Acids (FISH OIL) 1000 MG CAPS Take by mouth daily.    . rosuvastatin (CRESTOR) 40 MG tablet Take 1 tablet (40 mg total) by mouth daily. 30 tablet 3  . vitamin C (ASCORBIC ACID) 500 MG tablet Take 500 mg by mouth daily.     No current facility-administered medications on file prior to visit.    Medical History:  Past Medical History  Diagnosis Date  . Hyperlipidemia   . Hypertension   . Vitamin D deficiency   . Other testicular hypofunction     Allergies: No Known Allergies   Review of Systems:  Review of Systems  Constitutional: Negative for fever, chills and malaise/fatigue.  HENT: Negative for congestion, nosebleeds and sore throat.   Respiratory: Negative for cough, shortness of breath and wheezing.   Cardiovascular: Negative for chest pain, palpitations and leg swelling.  Gastrointestinal: Positive for heartburn. Negative for diarrhea, constipation, blood in stool and melena.  Genitourinary: Negative.   Skin: Negative.   Neurological: Negative for dizziness, sensory change, loss of consciousness and headaches.  Psychiatric/Behavioral: Negative for depression. The patient is not nervous/anxious and does not have insomnia.     Family history- Review and unchanged  Social  history- Review and unchanged  Physical Exam: BP 122/70 mmHg  Pulse 68  Temp(Src) 98.4 F (36.9 C) (Temporal)  Resp 18  Ht 6' 1.5" (1.867 m)  Wt 204 lb (92.534 kg)  BMI 26.55 kg/m2 Wt Readings from Last 3 Encounters:  06/21/15 204 lb (92.534 kg)  03/02/15 205 lb 12.8 oz (93.35 kg)  11/23/14 205 lb (92.987 kg)    General Appearance: Well nourished well developed, in no apparent distress. Eyes: PERRLA, EOMs, conjunctiva no swelling or erythema ENT/Mouth: Ear canals normal without obstruction, swelling, erythma, discharge.  TMs normal bilaterally.  Oropharynx moist, clear, without exudate, or postoropharyngeal swelling. Neck: Supple, thyroid normal,no cervical adenopathy  Respiratory: Respiratory effort normal, Breath sounds clear A&P without rhonchi, wheeze, or rale.  No retractions, no accessory usage. Cardio: RRR with no MRGs. Brisk peripheral pulses without edema.  Abdomen: Soft, + BS,  Non tender, no guarding, rebound, hernias, masses. Musculoskeletal: Full ROM, 5/5 strength, Normal gait Skin: Warm, dry without rashes, lesions, ecchymosis.  Neuro: Awake and oriented X 3, Cranial nerves intact. Normal muscle tone, no cerebellar symptoms. Psych: Normal affect, Insight and Judgment appropriate.    Terri Piedra, PA-C 4:57 PM [facility]

## 2015-08-06 ENCOUNTER — Other Ambulatory Visit: Payer: Self-pay | Admitting: Internal Medicine

## 2015-08-25 ENCOUNTER — Encounter: Payer: Self-pay | Admitting: Internal Medicine

## 2015-08-30 ENCOUNTER — Ambulatory Visit (INDEPENDENT_AMBULATORY_CARE_PROVIDER_SITE_OTHER): Payer: Self-pay | Admitting: Internal Medicine

## 2015-08-30 ENCOUNTER — Encounter: Payer: Self-pay | Admitting: Internal Medicine

## 2015-08-30 VITALS — BP 120/76 | HR 72 | Temp 97.5°F | Resp 16 | Ht 73.25 in | Wt 202.8 lb

## 2015-08-30 DIAGNOSIS — R5383 Other fatigue: Secondary | ICD-10-CM

## 2015-08-30 DIAGNOSIS — I10 Essential (primary) hypertension: Secondary | ICD-10-CM

## 2015-08-30 DIAGNOSIS — Z125 Encounter for screening for malignant neoplasm of prostate: Secondary | ICD-10-CM | POA: Diagnosis not present

## 2015-08-30 DIAGNOSIS — E785 Hyperlipidemia, unspecified: Secondary | ICD-10-CM

## 2015-08-30 DIAGNOSIS — Z1212 Encounter for screening for malignant neoplasm of rectum: Secondary | ICD-10-CM | POA: Diagnosis not present

## 2015-08-30 DIAGNOSIS — Z79899 Other long term (current) drug therapy: Secondary | ICD-10-CM

## 2015-08-30 DIAGNOSIS — Z0000 Encounter for general adult medical examination without abnormal findings: Secondary | ICD-10-CM | POA: Diagnosis not present

## 2015-08-30 DIAGNOSIS — R7303 Prediabetes: Secondary | ICD-10-CM

## 2015-08-30 DIAGNOSIS — Z111 Encounter for screening for respiratory tuberculosis: Secondary | ICD-10-CM

## 2015-08-30 DIAGNOSIS — E559 Vitamin D deficiency, unspecified: Secondary | ICD-10-CM | POA: Diagnosis not present

## 2015-08-30 DIAGNOSIS — E349 Endocrine disorder, unspecified: Secondary | ICD-10-CM

## 2015-08-30 DIAGNOSIS — Z0001 Encounter for general adult medical examination with abnormal findings: Secondary | ICD-10-CM

## 2015-08-30 LAB — CBC WITH DIFFERENTIAL/PLATELET
BASOS ABS: 0 10*3/uL (ref 0.0–0.1)
BASOS PCT: 0 % (ref 0–1)
EOS ABS: 0 10*3/uL (ref 0.0–0.7)
Eosinophils Relative: 0 % (ref 0–5)
HCT: 45.6 % (ref 39.0–52.0)
HEMOGLOBIN: 15.9 g/dL (ref 13.0–17.0)
LYMPHS ABS: 2.1 10*3/uL (ref 0.7–4.0)
Lymphocytes Relative: 34 % (ref 12–46)
MCH: 31.1 pg (ref 26.0–34.0)
MCHC: 34.9 g/dL (ref 30.0–36.0)
MCV: 89.2 fL (ref 78.0–100.0)
MPV: 9.2 fL (ref 8.6–12.4)
Monocytes Absolute: 0.4 10*3/uL (ref 0.1–1.0)
Monocytes Relative: 7 % (ref 3–12)
NEUTROS ABS: 3.7 10*3/uL (ref 1.7–7.7)
NEUTROS PCT: 59 % (ref 43–77)
PLATELETS: 254 10*3/uL (ref 150–400)
RBC: 5.11 MIL/uL (ref 4.22–5.81)
RDW: 13.4 % (ref 11.5–15.5)
WBC: 6.2 10*3/uL (ref 4.0–10.5)

## 2015-08-30 LAB — LIPID PANEL
CHOL/HDL RATIO: 2.3 ratio (ref <=5.0)
Cholesterol: 162 mg/dL (ref 125–200)
HDL: 70 mg/dL (ref >=40)
LDL CALC: 79 mg/dL (ref <130)
Triglycerides: 66 mg/dL (ref <150)
VLDL: 13 mg/dL (ref <30)

## 2015-08-30 LAB — BASIC METABOLIC PANEL WITH GFR
BUN: 15 mg/dL (ref 7–25)
CHLORIDE: 103 mmol/L (ref 98–110)
CO2: 25 mmol/L (ref 20–31)
Calcium: 9.3 mg/dL (ref 8.6–10.3)
Creat: 0.88 mg/dL (ref 0.70–1.25)
GFR, Est African American: >89 mL/min (ref >=60)
GFR, Est Non African American: >89 mL/min (ref >=60)
Glucose, Bld: 92 mg/dL (ref 65–99)
POTASSIUM: 4.1 mmol/L (ref 3.5–5.3)
SODIUM: 137 mmol/L (ref 135–146)

## 2015-08-30 LAB — HEMOGLOBIN A1C
Hgb A1c MFr Bld: 5.3 % (ref <5.7)
Mean Plasma Glucose: 105 mg/dL (ref <117)

## 2015-08-30 LAB — MAGNESIUM: Magnesium: 2.1 mg/dL (ref 1.5–2.5)

## 2015-08-30 LAB — HEPATIC FUNCTION PANEL
ALK PHOS: 61 U/L (ref 40–115)
ALT: 39 U/L (ref 9–46)
AST: 27 U/L (ref 10–35)
Albumin: 4.4 g/dL (ref 3.6–5.1)
BILIRUBIN DIRECT: 0.1 mg/dL (ref <=0.2)
BILIRUBIN INDIRECT: 0.5 mg/dL (ref 0.2–1.2)
BILIRUBIN TOTAL: 0.6 mg/dL (ref 0.2–1.2)
Total Protein: 7.1 g/dL (ref 6.1–8.1)

## 2015-08-30 LAB — IRON AND TIBC
%SAT: 40 % (ref 15–60)
IRON: 133 ug/dL (ref 50–180)
TIBC: 334 ug/dL (ref 250–425)
UIBC: 201 ug/dL (ref 125–400)

## 2015-08-30 NOTE — Progress Notes (Signed)
Patient ID: Brett Perez, male   DOB: 1950-07-10, 65 y.o.   MRN: [id number]  Annual  Screening/Preventative Visit And Comprehensive Evaluation & Examination  This very nice 65 y.o.male presents for a Wellness/Preventative Visit & comprehensive evaluation and management of multiple medical co-morbidities.  Patient has been followed for HTN, T2_NIDDM  Prediabetes, Hyperlipidemia and Vitamin D Deficiency.   HTN predates since     . Patient's BP has been controlled at home.Today's BP: 120/76 mmHg. Patient denies any cardiac symptoms as chest pain, palpitations, shortness of breath, dizziness or ankle swelling.   Patient's hyperlipidemia is controlled with diet and medications. Patient denies myalgias or other medication SE's. Last lipids were  Cholesterol 161; HDL 73; LDL 59; Triglycerides 147 on 03/02/2015.   Patient has prediabetes since    and patient denies reactive hypoglycemic symptoms, visual blurring, diabetic polys or paresthesias. Last A1c was 03/02/2015: Hgb A1c MFr Bld 5.1% on     Finally, patient has history of Vitamin D Deficiency of    and last vitamin D was  56 on 03/02/2015.   Medication Sig  . ANDROGEL PUMP (1.62%) GEL APPLY ONE PUMP TO EACH ARM AS DIRECTED  . aspirin 81 MG  Take 81 mg by mouth 3 (three) times a week.  Marland Kitchen atenolol  100 MG  TAKE 1 TABLET BY MOUTH EVERY DAY  . VITAMIN D Take by mouth. Takes 4000 units daily  . CRESTOR 40 MG tablet TAKE 1 TABLET BY MOUTH EVERY DAY  . glucosamine-chondroitin 500-400 MG  Take 1 tablet by mouth 2 (two) times daily.  . Multiple Vitamin  tablet Take 1 tablet by mouth daily.  . Omega-3 FISH OIL 1000 MG  Take by mouth daily.  . vitamin C  500 MG  Take 500 mg by mouth daily.   No Known Allergies   Past Medical History  Diagnosis Date  . Hyperlipidemia   . Hypertension   . Vitamin D deficiency   . Other testicular hypofunction    Health Maintenance  Topic Date Due  . INFLUENZA VACCINE  02/07/2016  . COLONOSCOPY  07/31/2018  .  TETANUS/TDAP  08/13/2024  . ZOSTAVAX  Completed  . Hepatitis C Screening  Completed  . HIV Screening  Completed   Immunization History  Administered Date(s) Administered  . Influenza, Seasonal, Injecte, Preservative Fre 06/21/2015  . Influenza-Unspecified 07/01/2014  . PPD Test 08/13/2014  . Pneumococcal Conjugate-13 08/13/2014  . Pneumococcal-Unspecified 07/09/1997  . Td 07/09/2004  . Tdap 08/13/2014  . Zoster 06/29/2014   Past Surgical History  Procedure Laterality Date  . Appendectomy  1960  . Ankle surgery Left 1983    for bone spur  . Tonsillectomy  1958  . Knee arthroscopy Left 1986, 1987   Family History  Problem Relation Age of Onset  . Colon cancer Mother 87  . Colon cancer Maternal Uncle 58    Social History   Social History  . Marital Status: Married    Spouse Name: N/A  . Number of Children: N/A  . Years of Education: N/A   Occupational History  . Not on file.   Social History Main Topics  . Smoking status: Current Some Day Smoker    Types: Cigars  . Smokeless tobacco: Never Used     Comment: rarely smokes a  cigar  . Alcohol Use: 6.0 oz/week    10 Cans of beer per week  . Drug Use: No  . Sexual Activity: Not on file   Other Topics Concern  .  Not on file   Social History Narrative    ROS Constitutional: Denies fever, chills, weight loss/gain, headaches, insomnia,  night sweats or change in appetite. Does c/o fatigue. Eyes: Denies redness, blurred vision, diplopia, discharge, itchy or watery eyes.  ENT: Denies discharge, congestion, post nasal drip, epistaxis, sore throat, earache, hearing loss, dental pain, Tinnitus, Vertigo, Sinus pain or snoring.  Cardio: Denies chest pain, palpitations, irregular heartbeat, syncope, dyspnea, diaphoresis, orthopnea, PND, claudication or edema Respiratory: denies cough, dyspnea, DOE, pleurisy, hoarseness, laryngitis or wheezing.  Gastrointestinal: Denies dysphagia, heartburn, reflux, water brash, pain,  cramps, nausea, vomiting, bloating, diarrhea, constipation, hematemesis, melena, hematochezia, jaundice or hemorrhoids Genitourinary: Denies dysuria, frequency, urgency, nocturia, hesitancy, discharge, hematuria or flank pain Musculoskeletal: Denies arthralgia, myalgia, stiffness, Jt. Swelling, pain, limp or strain/sprain. Denies Falls. Skin: Denies puritis, rash, hives, warts, acne, eczema or change in skin lesion Neuro: No weakness, tremor, incoordination, spasms, paresthesia or pain Psychiatric: Denies confusion, memory loss or sensory loss. Denies Depression. Endocrine: Denies change in weight, skin, hair change, nocturia, and paresthesia, diabetic polys, visual blurring or hyper / hypo glycemic episodes.  Heme/Lymph: No excessive bleeding, bruising or enlarged lymph nodes.  Physical Exam  BP 120/76 mmHg  Pulse 72  Temp(Src) 97.5 F (36.4 C)  Resp 16  Ht 6' 1.25" (1.861 m)  Wt 202 lb 12.8 oz (91.989 kg)  BMI 26.56 kg/m2  General Appearance: Well nourished, in no apparent distress. Eyes: PERRLA, EOMs, conjunctiva no swelling or erythema, normal fundi and vessels. Sinuses: No frontal/maxillary tenderness ENT/Mouth: EACs patent / TMs  nl. Nares clear without erythema, swelling, mucoid exudates. Oral hygiene is good. No erythema, swelling, or exudate. Tongue normal, non-obstructing. Tonsils not swollen or erythematous. Hearing normal.  Neck: Supple, thyroid normal. No bruits, nodes or JVD. Respiratory: Respiratory effort normal.  BS equal and clear bilateral without rales, rhonci, wheezing or stridor. Cardio: Heart sounds are normal with regular rate and rhythm and no murmurs, rubs or gallops. Peripheral pulses are normal and equal bilaterally without edema. No aortic or femoral bruits. Chest: symmetric with normal excursions and percussion.  Abdomen: Soft, with Nl bowel sounds. Nontender, no guarding, rebound, hernias, masses, or organomegaly.  Lymphatics: Non tender without  lymphadenopathy.  Genitourinary: No hernias.Testes nl. DRE - prostate nl for age - smooth & firm w/o nodules. Musculoskeletal: Full ROM all peripheral extremities, joint stability, 5/5 strength, and normal gait. Skin: Warm and dry without rashes, lesions, cyanosis, clubbing or  ecchymosis.  Neuro: Cranial nerves intact, reflexes equal bilaterally. Normal muscle tone, no cerebellar symptoms. Sensation intact.  Pysch: Alert and oriented X 3 with normal affect, insight and judgment appropriate.   Assessment and Plan  1. Annual Preventative/Screening Exam    Continue prudent diet as discussed, weight control, BP monitoring, regular exercise, and medications as discussed.  Discussed med effects and SE's. Routine screening labs and tests as requested with regular follow-up as recommended. Over 40 minutes of exam, counseling, chart review and high complex critical decision making was performed

## 2015-08-30 NOTE — Addendum Note (Signed)
Addended by: Valrie Hart C on: 08/30/2015 04:25 PM   Modules accepted: Orders

## 2015-08-30 NOTE — Progress Notes (Signed)
Patient ID: Brett Perez, male   DOB: Aug 12, 1950, 65 y.o.   MRN: [id number]  Annual  Screening/Preventative Visit And Comprehensive Evaluation & Examination  This very nice 65 y.o. MWM presents for a Wellness/Preventative Visit & comprehensive evaluation and management of multiple medical co-morbidities.  Patient has been followed for HTN, Prediabetes, Hyperlipidemia, Testosterone  and Vitamin D Deficiency.   HTN predates since     . Patient's BP has been controlled at home.Today's  . Patient denies any cardiac symptoms as chest pain, palpitations, shortness of breath, dizziness or ankle swelling.   Patient's hyperlipidemia is controlled with diet and medications. Patient denies myalgias or other medication SE's. Last lipids were  Cholesterol 161; HDL 73; LDL 59; Triglycerides 147 on 03/02/2015.   Patient has prediabetes since    and patient denies reactive hypoglycemic symptoms, visual blurring, diabetic polys or paresthesias. Last A1c was  5.1% on 03/02/2015.     Finally, patient has history of Vitamin D Deficiency of    and last vitamin D was  56 on 03/02/2015.    Medication Sig  . ANDROGEL PUMP  (1.62%) GEL APPLY ONE PUMP TO EACH ARM AS DIRECTED  . aspirin 81 MG Take 81 mg by mouth 3 (three) times a week.  Marland Kitchen atenolol  100 MG  TAKE 1 TABLET BY MOUTH EVERY DAY  . VITAMIN D  Take by mouth. Takes 4000 units daily  . CRESTOR 40 MG  TAKE 1 TABLET BY MOUTH EVERY DAY  . fluorouracil  5 % crm Apply topically 2 (two) times daily.  Marland Kitchen glucosamine-chondroitin 500-400 MG Take 1 tablet by mouth 2 (two) times daily.  . meloxicam  15 MG  Take 0.5 tablets (7.5 mg total) by mouth daily.  . Multiple Vitamin  Take 1 tablet by mouth daily.  . Omega-3 FISH OIL 1000 MG Take by mouth daily.  . vitamin C  500 MG  Take 500 mg by mouth daily.   No Known Allergies   Past Medical History  Diagnosis Date  . Hyperlipidemia   . Hypertension   . Vitamin D deficiency   . Other testicular hypofunction    Health  Maintenance  Topic Date Due  . INFLUENZA VACCINE  02/07/2016  . COLONOSCOPY  07/31/2018  . TETANUS/TDAP  08/13/2024  . ZOSTAVAX  Completed  . Hepatitis C Screening  Completed  . HIV Screening  Completed   Immunization History  Administered Date(s) Administered  . Influenza, Seasonal, Injecte, Preservative Fre 06/21/2015  . Influenza-Unspecified 07/01/2014  . PPD Test 08/13/2014  . Pneumococcal Conjugate-13 08/13/2014  . Pneumococcal-Unspecified 07/09/1997  . Td 07/09/2004  . Tdap 08/13/2014  . Zoster 06/29/2014   Past Surgical History  Procedure Laterality Date  . Appendectomy  1960  . Ankle surgery Left 1983    for bone spur  . Tonsillectomy  1958  . Knee arthroscopy Left 1986, 1987   Family History  Problem Relation Age of Onset  . Colon cancer Mother 85  . Colon cancer Maternal Uncle 39    Social History   Social History  . Marital Status: Married    Spouse Name: N/A  . Number of Children: N/A  . Years of Education: N/A   Occupational History  . Not on file.   Social History Main Topics  . Smoking status: Current Some Day Smoker    Types: Cigars  . Smokeless tobacco: Never Used     Comment: rarely smokes a  cigar  . Alcohol Use: 6.0 oz/week  10 Cans of beer per week  . Drug Use: No  . Sexual Activity: Not on file   Other Topics Concern  . Not on file   Social History Narrative    ROS Constitutional: Denies fever, chills, weight loss/gain, headaches, insomnia,  night sweats or change in appetite. Does c/o fatigue. Eyes: Denies redness, blurred vision, diplopia, discharge, itchy or watery eyes.  ENT: Denies discharge, congestion, post nasal drip, epistaxis, sore throat, earache, hearing loss, dental pain, Tinnitus, Vertigo, Sinus pain or snoring.  Cardio: Denies chest pain, palpitations, irregular heartbeat, syncope, dyspnea, diaphoresis, orthopnea, PND, claudication or edema Respiratory: denies cough, dyspnea, DOE, pleurisy, hoarseness, laryngitis  or wheezing.  Gastrointestinal: Denies dysphagia, heartburn, reflux, water brash, pain, cramps, nausea, vomiting, bloating, diarrhea, constipation, hematemesis, melena, hematochezia, jaundice or hemorrhoids Genitourinary: Denies dysuria, frequency, urgency, nocturia, hesitancy, discharge, hematuria or flank pain Musculoskeletal: Denies arthralgia, myalgia, stiffness, Jt. Swelling, pain, limp or strain/sprain. Denies Falls. Skin: Denies puritis, rash, hives, warts, acne, eczema or change in skin lesion Neuro: No weakness, tremor, incoordination, spasms, paresthesia or pain Psychiatric: Denies confusion, memory loss or sensory loss. Denies Depression. Endocrine: Denies change in weight, skin, hair change, nocturia, and paresthesia, diabetic polys, visual blurring or hyper / hypo glycemic episodes.  Heme/Lymph: No excessive bleeding, bruising or enlarged lymph nodes.  Physical Exam  There were no vitals taken for this visit.  General Appearance: Well nourished, in no apparent distress. Eyes: PERRLA, EOMs, conjunctiva no swelling or erythema, normal fundi and vessels. Sinuses: No frontal/maxillary tenderness ENT/Mouth: EACs patent / TMs  nl. Nares clear without erythema, swelling, mucoid exudates. Oral hygiene is good. No erythema, swelling, or exudate. Tongue normal, non-obstructing. Tonsils not swollen or erythematous. Hearing normal.  Neck: Supple, thyroid normal. No bruits, nodes or JVD. Respiratory: Respiratory effort normal.  BS equal and clear bilateral without rales, rhonci, wheezing or stridor. Cardio: Heart sounds are normal with regular rate and rhythm and no murmurs, rubs or gallops. Peripheral pulses are normal and equal bilaterally without edema. No aortic or femoral bruits. Chest: symmetric with normal excursions and percussion.  Abdomen: Soft, with Nl bowel sounds. Nontender, no guarding, rebound, hernias, masses, or organomegaly.  Lymphatics: Non tender without lymphadenopathy.   Genitourinary: No hernias.Testes nl. DRE - prostate nl for age - smooth & firm w/o nodules. Musculoskeletal: Full ROM all peripheral extremities, joint stability, 5/5 strength, and normal gait. Skin: Warm and dry without rashes, lesions, cyanosis, clubbing or  ecchymosis.  Neuro: Cranial nerves intact, reflexes equal bilaterally. Normal muscle tone, no cerebellar symptoms. Sensation intact.  Pysch: Alert and oriented X 3 with normal affect, insight and judgment appropriate.   Assessment and Plan  1. Annual Preventative/Screening Exam    Continue prudent diet as discussed, weight control, BP monitoring, regular exercise, and medications as discussed.  Discussed med effects and SE's. Routine screening labs and tests as requested with regular follow-up as recommended. Over 40 minutes of exam, counseling, chart review and high complex critical decision making was performed

## 2015-08-30 NOTE — Progress Notes (Addendum)
Patient ID: Brett Perez, male   DOB: Sep 23, 1950, 65 y.o.   MRN: [id number] .  Annual  Screening/Preventative Visit And Comprehensive Evaluation & Examination  This very nice 65 y.o. MWM presents for a Wellness/Preventative Visit & comprehensive evaluation and management of multiple medical co-morbidities.  Patient has been followed for HTN, Prediabetes, Hyperlipidemia, Testosterone  and Vitamin D Deficiency.   HTN predates since 2006. Patient's BP has been controlled at home.Today's BP: 120/76 mmHg. Patient denies any cardiac symptoms as chest pain, palpitations, shortness of breath, dizziness or ankle swelling.   Patient's hyperlipidemia is controlled with diet and medications. Patient denies myalgias or other medication SE's. Last lipids were at goal with Cholesterol 161; HDL 73; LDL 59; Triglycerides 147 on 03/02/2015.   Patient is screened proactively for prediabetes and patient denies reactive hypoglycemic symptoms, visual blurring, diabetic polys or paresthesias. Last A1c was 5.1% on 03/02/2015.     Patient has hx/o Low T 228.87 in 2013 and has been on replacement therapy since with Testosterone gel. Finally, patient has history of Vitamin D Deficiency of "28" in 2008  and last vitamin D was 56 on  03/02/2015.    Medication Sig  . ANDROGEL PUMP  1.62% GEL APPLY ONE PUMP TO EACH ARM AS DIRECTED  . aspirin 81 MG Take 81 mg by mouth 3 (three) times a week.  Marland Kitchen atenolol  100 MG  TAKE 1 TABLET BY MOUTH EVERY DAY  . VITAMIN D  Take by mouth. Takes 4000 units daily  . CRESTOR 40 MG t TAKE 1 TABLET BY MOUTH EVERY DAY  . glucosamine-chondroitin 500-400 MG  Take 1 tablet by mouth 2 (two) times daily.  . Multiple Vitamin  Take 1 tablet by mouth daily.  . Omega-3 FISH OIL 1000 MG  Take by mouth daily.  . vitamin C  500 MG  Take 500 mg by mouth daily.   No Known Allergies   Past Medical History  Diagnosis Date  . Hyperlipidemia   . Hypertension   . Vitamin D deficiency   . Other testicular  hypofunction    Health Maintenance  Topic Date Due  . INFLUENZA VACCINE  02/07/2016  . COLONOSCOPY  07/31/2018  . TETANUS/TDAP  08/13/2024  . ZOSTAVAX  Completed  . Hepatitis C Screening  Completed  . HIV Screening  Completed   Immunization History  Administered Date(s) Administered  . Influenza, Seasonal, Injecte, Preservative Fre 06/21/2015  . Influenza-Unspecified 07/01/2014  . PPD Test 08/13/2014, 08/30/2015  . Pneumococcal Conjugate-13 08/13/2014  . Pneumococcal-Unspecified 07/09/1997  . Td 07/09/2004  . Tdap 08/13/2014  . Zoster 06/29/2014   Past Surgical History  Procedure Laterality Date  . Appendectomy  1960  . Ankle surgery Left 1983    for bone spur  . Tonsillectomy  1958  . Knee arthroscopy Left 1986, 1987   Family History  Problem Relation Age of Onset  . Colon cancer Mother 25  . Colon cancer Maternal Uncle 57   Social History   Social History  . Marital Status: Married    Spouse Name: N/A  . Number of Children: N/A  . Years of Education: N/A   Occupational History  . Not on file.   Social History Main Topics  . Smoking status: Current Some Day Smoker    Types: Cigars  . Smokeless tobacco: Never Used     Comment: rarely smokes a  cigar  . Alcohol Use: 6.0 oz/week    10 Cans of beer per week  .  Drug Use: No  . Sexual Activity: Not on file   Other Topics Concern  . Not on file   Social History Narrative    ROS Constitutional: Denies fever, chills, weight loss/gain, headaches, insomnia,  night sweats or change in appetite. Does c/o fatigue. Eyes: Denies redness, blurred vision, diplopia, discharge, itchy or watery eyes.  ENT: Denies discharge, congestion, post nasal drip, epistaxis, sore throat, earache, hearing loss, dental pain, Tinnitus, Vertigo, Sinus pain or snoring.  Cardio: Denies chest pain, palpitations, irregular heartbeat, syncope, dyspnea, diaphoresis, orthopnea, PND, claudication or edema Respiratory: denies cough, dyspnea,  DOE, pleurisy, hoarseness, laryngitis or wheezing.  Gastrointestinal: Denies dysphagia, heartburn, reflux, water brash, pain, cramps, nausea, vomiting, bloating, diarrhea, constipation, hematemesis, melena, hematochezia, jaundice or hemorrhoids Genitourinary: Denies dysuria, frequency, urgency, nocturia, hesitancy, discharge, hematuria or flank pain Musculoskeletal: Denies arthralgia, myalgia, stiffness, Jt. Swelling, pain, limp or strain/sprain. Denies Falls. Skin: Denies puritis, rash, hives, warts, acne, eczema or change in skin lesion Neuro: No weakness, tremor, incoordination, spasms, paresthesia or pain Psychiatric: Denies confusion, memory loss or sensory loss. Denies Depression. Endocrine: Denies change in weight, skin, hair change, nocturia, and paresthesia, diabetic polys, visual blurring or hyper / hypo glycemic episodes.  Heme/Lymph: No excessive bleeding, bruising or enlarged lymph nodes.  Physical Exam  BP 120/76 mmHg  Pulse 72  Temp(Src) 97.5 F (36.4 C)  Resp 16  Ht 6' 1.25" (1.861 m)  Wt 202 lb 12.8 oz (91.989 kg)  BMI 26.56 kg/m2  General Appearance: Well nourished, in no apparent distress. Eyes: PERRLA, EOMs, conjunctiva no swelling or erythema, normal fundi and vessels. Sinuses: No frontal/maxillary tenderness ENT/Mouth: EACs patent / TMs  nl. Nares clear without erythema, swelling, mucoid exudates. Oral hygiene is good. No erythema, swelling, or exudate. Tongue normal, non-obstructing. Tonsils not swollen or erythematous. Hearing normal.  Neck: Supple, thyroid normal. No bruits, nodes or JVD. Respiratory: Respiratory effort normal.  BS equal and clear bilateral without rales, rhonci, wheezing or stridor. Cardio: Heart sounds are normal with regular rate and rhythm and no murmurs, rubs or gallops. Peripheral pulses are normal and equal bilaterally without edema. No aortic or femoral bruits. Chest: symmetric with normal excursions and percussion.  Abdomen: Soft, with  Nl bowel sounds. Nontender, no guarding, rebound, hernias, masses, or organomegaly.  Lymphatics: Non tender without lymphadenopathy.  Genitourinary: No hernias.Testes nl. DRE - prostate nl for age - smooth & firm w/o nodules. Musculoskeletal: Full ROM all peripheral extremities, joint stability, 5/5 strength, and normal gait. Skin: Warm and dry without rashes, lesions, cyanosis, clubbing or  ecchymosis.  Neuro: Cranial nerves intact, reflexes equal bilaterally. Normal muscle tone, no cerebellar symptoms. Sensation intact.  Pysch: Alert and oriented X 3 with normal affect, insight and judgment appropriate.   Assessment and Plan  1. Annual Preventative/Screening Exam   - Microalbumin / creatinine urine ratio - EKG 12-Lead - [place], RETROPERITNL ABD,  LTD - POC Hemoccult Bld/Stl  - Urinalysis, Routine w reflex microscopic - Vitamin B12 - Iron and TIBC - PSA - Testosterone - CBC with Differential/Platelet - BASIC METABOLIC PANEL WITH GFR - Hepatic function panel - Magnesium - Lipid panel - TSH - Hemoglobin A1c - Insulin, random - VITAMIN D 25 Hydroxy   2. Essential hypertension  - Microalbumin / creatinine urine ratio - EKG 12-Lead - [place], RETROPERITNL ABD,  LTD - TSH  3. Hyperlipidemia  - Lipid panel - TSH  4. Prediabetes  - Hemoglobin A1c - Insulin, random  5. Vitamin D deficiency  - VITAMIN D  25 Hydroxy   6. Testosterone deficiency  - Testosterone  7. Screening for rectal cancer  - POC Hemoccult Bld/Stl   8. Prostate cancer screening  - PSA  9. Other fatigue  - Vitamin B12 - Iron and TIBC - Testosterone - CBC with Differential/Platelet - TSH  10. Medication management  - Urinalysis, Routine w reflex microscopic  - CBC with Differential/Platelet - BASIC METABOLIC PANEL WITH GFR - Hepatic function panel - Magnesium  11. Screening examination for pulmonary tuberculosis  - PPD   Continue prudent diet as discussed, weight control, BP monitoring,  regular exercise, and medications as discussed.  Discussed med effects and SE's. Routine screening labs and tests as requested with regular follow-up as recommended. Over 40 minutes of exam, counseling, chart review and high complex critical decision making was performed

## 2015-08-30 NOTE — Patient Instructions (Signed)

## 2015-08-31 LAB — URINALYSIS, ROUTINE W REFLEX MICROSCOPIC
BILIRUBIN URINE: NEGATIVE
Glucose, UA: NEGATIVE
HGB URINE DIPSTICK: NEGATIVE
KETONES UR: NEGATIVE
Leukocytes, UA: NEGATIVE
NITRITE: NEGATIVE
PROTEIN: NEGATIVE
SPECIFIC GRAVITY, URINE: 1.018 (ref 1.001–1.035)
pH: 6.5 (ref 5.0–8.0)

## 2015-08-31 LAB — TESTOSTERONE: Testosterone: 390 ng/dL (ref 250–827)

## 2015-08-31 LAB — MICROALBUMIN / CREATININE URINE RATIO
CREATININE, URINE: 125 mg/dL (ref 20–370)
MICROALB UR: 0.4 mg/dL
MICROALB/CREAT RATIO: 3 ug/mg{creat} (ref <30)

## 2015-08-31 LAB — VITAMIN D 25 HYDROXY (VIT D DEFICIENCY, FRACTURES): VIT D 25 HYDROXY: 66 ng/mL (ref 30–100)

## 2015-08-31 LAB — PSA: PSA: 0.97 ng/mL (ref <=4.00)

## 2015-08-31 LAB — INSULIN, RANDOM: Insulin: 5 u[IU]/mL (ref 2.0–19.6)

## 2015-08-31 LAB — TSH: TSH: 1.08 m[IU]/L (ref 0.40–4.50)

## 2015-08-31 LAB — VITAMIN B12: Vitamin B-12: 396 pg/mL (ref 200–1100)

## 2015-09-01 LAB — TB SKIN TEST
Induration: 0 mm
TB Skin Test: NEGATIVE

## 2015-09-23 ENCOUNTER — Encounter: Payer: Self-pay | Admitting: Gastroenterology

## 2015-11-08 ENCOUNTER — Other Ambulatory Visit: Payer: Self-pay | Admitting: *Deleted

## 2015-11-08 DIAGNOSIS — Z0001 Encounter for general adult medical examination with abnormal findings: Secondary | ICD-10-CM

## 2015-11-08 DIAGNOSIS — Z1212 Encounter for screening for malignant neoplasm of rectum: Secondary | ICD-10-CM

## 2015-11-08 LAB — POC HEMOCCULT BLD/STL (HOME/3-CARD/SCREEN)
Card #2 Fecal Occult Blod, POC: NEGATIVE
Card #3 Fecal Occult Blood, POC: NEGATIVE
FECAL OCCULT BLD: NEGATIVE

## 2015-12-01 ENCOUNTER — Ambulatory Visit: Payer: Self-pay | Admitting: Internal Medicine

## 2016-01-20 ENCOUNTER — Other Ambulatory Visit: Payer: Self-pay | Admitting: Internal Medicine

## 2016-02-09 ENCOUNTER — Emergency Department (HOSPITAL_COMMUNITY)
Admission: EM | Admit: 2016-02-09 | Discharge: 2016-02-09 | Disposition: A | Discharge status: Home / Self Care | Payer: BLUE CROSS/BLUE SHIELD | Attending: Emergency Medicine | Admitting: Emergency Medicine

## 2016-02-09 ENCOUNTER — Encounter (HOSPITAL_COMMUNITY): Payer: Self-pay | Admitting: Emergency Medicine

## 2016-02-09 DIAGNOSIS — S0990XA Unspecified injury of head, initial encounter: Secondary | ICD-10-CM | POA: Diagnosis present

## 2016-02-09 DIAGNOSIS — S0101XA Laceration without foreign body of scalp, initial encounter: Secondary | ICD-10-CM | POA: Diagnosis not present

## 2016-02-09 DIAGNOSIS — Z7982 Long term (current) use of aspirin: Secondary | ICD-10-CM | POA: Diagnosis not present

## 2016-02-09 DIAGNOSIS — Y929 Unspecified place or not applicable: Secondary | ICD-10-CM | POA: Insufficient documentation

## 2016-02-09 DIAGNOSIS — Y939 Activity, unspecified: Secondary | ICD-10-CM | POA: Insufficient documentation

## 2016-02-09 DIAGNOSIS — Y999 Unspecified external cause status: Secondary | ICD-10-CM | POA: Insufficient documentation

## 2016-02-09 DIAGNOSIS — I10 Essential (primary) hypertension: Secondary | ICD-10-CM | POA: Diagnosis not present

## 2016-02-09 DIAGNOSIS — Z79899 Other long term (current) drug therapy: Secondary | ICD-10-CM | POA: Insufficient documentation

## 2016-02-09 DIAGNOSIS — F17210 Nicotine dependence, cigarettes, uncomplicated: Secondary | ICD-10-CM | POA: Diagnosis not present

## 2016-02-09 DIAGNOSIS — W228XXA Striking against or struck by other objects, initial encounter: Secondary | ICD-10-CM | POA: Insufficient documentation

## 2016-02-09 MED ORDER — LIDOCAINE-EPINEPHRINE (PF) 2 %-1:200000 IJ SOLN
10.0000 mL | Freq: Once | INTRAMUSCULAR | Status: DC
  Filled 2016-02-09: qty 10

## 2016-02-09 MED ORDER — LIDOCAINE-EPINEPHRINE 2 %-1:100000 IJ SOLN
INTRAMUSCULAR | Status: AC
  Administered 2016-02-09: 10 mL
  Filled 2016-02-09: qty 1

## 2016-02-09 MED ORDER — LIDOCAINE-EPINEPHRINE 2 %-1:100000 IJ SOLN
10.0000 mL | Freq: Once | INTRAMUSCULAR | Status: AC
  Administered 2016-02-09: 10 mL

## 2016-02-09 NOTE — Discharge Instructions (Signed)
Read the information below.  You may return to the Emergency Department at any time for worsening condition or any new symptoms that concern you.  If you develop redness, swelling, pus draining from the wound, or fevers greater than 100.4, return to the ER immediately for a recheck.   °
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
## 2016-02-15 ENCOUNTER — Ambulatory Visit (INDEPENDENT_AMBULATORY_CARE_PROVIDER_SITE_OTHER): Payer: BLUE CROSS/BLUE SHIELD | Admitting: Internal Medicine

## 2016-02-15 ENCOUNTER — Encounter: Payer: Self-pay | Admitting: Internal Medicine

## 2016-02-15 VITALS — BP 128/80 | HR 76 | Temp 98.2°F | Resp 16 | Ht 73.25 in | Wt 207.0 lb

## 2016-02-15 DIAGNOSIS — Z4802 Encounter for removal of sutures: Secondary | ICD-10-CM | POA: Diagnosis not present

## 2016-02-15 DIAGNOSIS — S0191XD Laceration without foreign body of unspecified part of head, subsequent encounter: Secondary | ICD-10-CM | POA: Diagnosis not present

## 2016-02-15 NOTE — Progress Notes (Signed)
Patient presents to the office for staple removal.  This happened a week ago.  He has not had any issues with the wound.  No discharge bleeding, headache, nausea or vomiting.  He was up to date on his tetanus.  2 staples were placed in his wound.  He has had not headaches, nausea, vomiting, blurry vision, double vision or memory loss.  There are 2 staples in a linear laceration of the scalp in a well healing laceration.  Non-tender to palpation.  Without redness or discharge.  Staples were removed without incident.

## 2016-03-07 ENCOUNTER — Encounter: Payer: Self-pay | Admitting: Internal Medicine

## 2016-03-07 ENCOUNTER — Ambulatory Visit (INDEPENDENT_AMBULATORY_CARE_PROVIDER_SITE_OTHER): Payer: BLUE CROSS/BLUE SHIELD | Admitting: Internal Medicine

## 2016-03-07 VITALS — BP 126/88 | HR 76 | Temp 97.8°F | Resp 16 | Ht 73.25 in | Wt 207.8 lb

## 2016-03-07 DIAGNOSIS — E291 Testicular hypofunction: Secondary | ICD-10-CM | POA: Diagnosis not present

## 2016-03-07 DIAGNOSIS — E785 Hyperlipidemia, unspecified: Secondary | ICD-10-CM

## 2016-03-07 DIAGNOSIS — R7303 Prediabetes: Secondary | ICD-10-CM

## 2016-03-07 DIAGNOSIS — E559 Vitamin D deficiency, unspecified: Secondary | ICD-10-CM | POA: Diagnosis not present

## 2016-03-07 DIAGNOSIS — E349 Endocrine disorder, unspecified: Secondary | ICD-10-CM

## 2016-03-07 DIAGNOSIS — Z79899 Other long term (current) drug therapy: Secondary | ICD-10-CM

## 2016-03-07 DIAGNOSIS — I10 Essential (primary) hypertension: Secondary | ICD-10-CM

## 2016-03-07 LAB — CBC WITH DIFFERENTIAL/PLATELET
BASOS ABS: 0 {cells}/uL (ref 0–200)
Basophils Relative: 0 %
EOS ABS: 168 {cells}/uL (ref 15–500)
Eosinophils Relative: 3 %
HEMATOCRIT: 44.4 % (ref 38.5–50.0)
HEMOGLOBIN: 15.6 g/dL (ref 13.2–17.1)
LYMPHS ABS: 2856 {cells}/uL (ref 850–3900)
Lymphocytes Relative: 51 %
MCH: 31.3 pg (ref 27.0–33.0)
MCHC: 35.1 g/dL (ref 32.0–36.0)
MCV: 89 fL (ref 80.0–100.0)
MONO ABS: 616 {cells}/uL (ref 200–950)
MPV: 9.3 fL (ref 7.5–12.5)
Monocytes Relative: 11 %
NEUTROS ABS: 1960 {cells}/uL (ref 1500–7800)
NEUTROS PCT: 35 %
Platelets: 226 10*3/uL (ref 140–400)
RBC: 4.99 MIL/uL (ref 4.20–5.80)
RDW: 13.9 % (ref 11.0–15.0)
WBC: 5.6 10*3/uL (ref 3.8–10.8)

## 2016-03-07 LAB — TSH: TSH: 1.64 mIU/L (ref 0.40–4.50)

## 2016-03-07 NOTE — Patient Instructions (Signed)

## 2016-03-07 NOTE — Progress Notes (Signed)
[facility]                       Lucky Cowboy, M.D.        Dyanne Carrel. Steffanie Dunn, P.A.-C       Terri Piedra, P.A.-C  [facility]                [address]                [place], [place]. [address] Telephone [phone] Telefax [phone] ______________________________________________________________________     This very nice 65y.o.MWM presents for 6 month follow up with Hypertension, Hyperlipidemia, Pre-Diabetes and Vitamin D Deficiency.      Patient is treated for HTN circa 2006 & BP has been controlled at home. Today's BP: 126/88. Patient has had no complaints of any cardiac type chest pain, palpitations, dyspnea/orthopnea/PND, dizziness, claudication, or dependent edema.     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  His Hyperlipidemia is controlled with diet & meds. Patient denies myalgias or other med SE's. Last Lipids were at goal:  Lab Results  Component Value Date   CHOL 162 08/30/2015   HDL 70 08/30/2015   LDLCALC 79 08/30/2015   TRIG 66 08/30/2015   CHOLHDL 2.3 08/30/2015      Also, the patient is screened expectantly for PreDiabetes and has had no symptoms of reactive hypoglycemia, diabetic polys, paresthesias or visual blurring.  Last A1c was at goal: Lab Results  Component Value Date   HGBA1C 5.3 08/30/2015      Further, the patient also has history of Vitamin D Deficiency of "28" in 2008 and supplements vitamin D without any suspected side-effects. Last vitamin D was at goal: Lab Results  Component Value Date   VD25OH 66 08/30/2015   Current Outpatient Prescriptions on File Prior to Visit  Medication Sig  . ANDROGEL PUMP 1.62% GEL APPLY ONE PUMP TO EACH ARM AS DIRECTED  . aspirin 81 MG  Take 81 mg by mouth 3 (three) times a week.  Marland Kitchen atenolol  100 MG TAKE 1 TAB EVERY DAY  . VITAMIN  D 4000 units akes daily  . CRESTOR 40 MG TAKE 1 TAB EVERY DAY  . glucosamine-chondroitin  Take 1 tablet by mouth 2 (two) times daily.  . Multiple Vitamin  Take 1 tablet by mouth daily.  . Omega-3 FISH OIL 1000 MG  Take by mouth daily.  . vitamin C 500 MG  Take 500 mg by mouth daily.   No Known Allergies   PMHx:   Past Medical History:  Diagnosis Date  . Hyperlipidemia   . Hypertension   . Other testicular hypofunction   . Vitamin D deficiency    Immunization History  Administered Date(s) Administered  . Influenza, Seasonal, Injecte, Preservative Fre 06/21/2015  . Influenza-Unspecified 07/01/2014  . PPD Test 08/13/2014, 08/30/2015  . Pneumococcal Conjugate-13 08/13/2014  . Pneumococcal-Unspecified 07/09/1997  . Td 07/09/2004  . Tdap 08/13/2014  . Zoster 06/29/2014   Past Surgical History:  Procedure Laterality Date  . ANKLE SURGERY Left 1983   for bone spur  . APPENDECTOMY  1960  . KNEE ARTHROSCOPY Left 1986, 1987  . TONSILLECTOMY  1958   FHx:    Reviewed / unchanged  SHx:    Reviewed / unchanged  Systems Review:  Constitutional: Denies fever, chills, wt changes, headaches,  insomnia, fatigue, night sweats, change in appetite. Eyes: Denies redness, blurred vision, diplopia, discharge, itchy, watery eyes.  ENT: Denies discharge, congestion, post nasal drip, epistaxis, sore throat, earache, hearing loss, dental pain, tinnitus, vertigo, sinus pain, snoring.  CV: Denies chest pain, palpitations, irregular heartbeat, syncope, dyspnea, diaphoresis, orthopnea, PND, claudication or edema. Respiratory: denies cough, dyspnea, DOE, pleurisy, hoarseness, laryngitis, wheezing.  Gastrointestinal: Denies dysphagia, odynophagia, heartburn, reflux, water brash, abdominal pain or cramps, nausea, vomiting, bloating, diarrhea, constipation, hematemesis, melena, hematochezia  or hemorrhoids. Genitourinary: Denies dysuria, frequency, urgency, nocturia, hesitancy, discharge, hematuria or flank  pain. Musculoskeletal: Denies arthralgias, myalgias, stiffness, jt. swelling, pain, limping or strain/sprain.  Skin: Denies pruritus, rash, hives, warts, acne, eczema or change in skin lesion(s). Neuro: No weakness, tremor, incoordination, spasms, paresthesia or pain. Psychiatric: Denies confusion, memory loss or sensory loss. Endo: Denies change in weight, skin or hair change.  Heme/Lymph: No excessive bleeding, bruising or enlarged lymph nodes.  Physical Exam BP 126/88   Pulse 76   Temp 97.8 F (36.6 C)   Resp 16   Ht 6' 1.25" (1.861 m)   Wt 207 lb 12.8 oz (94.3 kg)   BMI 27.23 kg/m   Appears well nourished and in no distress.  Eyes: PERRLA, EOMs, conjunctiva no swelling or erythema. Sinuses: No frontal/maxillary tenderness ENT/Mouth: EAC's clear, TM's nl w/o erythema, bulging. Nares clear w/o erythema, swelling, exudates. Oropharynx clear without erythema or exudates. Oral hygiene is good. Tongue normal, non obstructing. Hearing intact.  Neck: Supple. Thyroid nl. Car 2+/2+ without bruits, nodes or JVD. Chest: Respirations nl with BS clear & equal w/o rales, rhonchi, wheezing or stridor.  Cor: Heart sounds normal w/ regular rate and rhythm without sig. murmurs, gallops, clicks, or rubs. Peripheral pulses normal and equal  without edema.  Abdomen: Soft & bowel sounds normal. Non-tender w/o guarding, rebound, hernias, masses, or organomegaly.  Lymphatics: Unremarkable.  Musculoskeletal: Full ROM all peripheral extremities, joint stability, 5/5 strength, and normal gait.  Skin: Warm, dry without exposed rashes, lesions or ecchymosis apparent.  Neuro: Cranial nerves intact, reflexes equal bilaterally. Sensory-motor testing grossly intact. Tendon reflexes grossly intact.  Pysch: Alert & oriented x 3.  Insight and judgement nl & appropriate. No ideations.  Assessment and Plan:  1. Essential hypertension  - Continue medication, monitor blood pressure at home. Continue DASH diet.  Reminder to go to the ER if any CP, SOB, nausea, dizziness, severe HA, changes vision/speech, left arm numbness and tingling and jaw pain. - TSH  2. Hyperlipidemia  - Continue diet/meds, exercise,& lifestyle modifications. Continue monitor periodic cholesterol/liver & renal functions  - Lipid panel - TSH  3. Prediabetes  - Continue diet, exercise, lifestyle modifications. Monitor appropriate labs. - Hemoglobin A1c - Insulin, random  4. Vitamin D deficiency  - Continue supplementation. - VITAMIN D 25 Hydroxy   5. Testosterone deficiency  - Testosterone  6. Medication management  - CBC with Differential/Platelet - BASIC METABOLIC PANEL WITH GFR - Hepatic function panel - Magnesium      Recommended regular exercise, BP monitoring, weight control, and discussed med and SE's. Recommended labs to assess and monitor clinical status. Further disposition pending results of labs. Over 30 minutes of exam, counseling, chart review was performed

## 2016-03-08 LAB — HEPATIC FUNCTION PANEL
ALT: 24 U/L (ref 9–46)
AST: 22 U/L (ref 10–35)
Albumin: 4.1 g/dL (ref 3.6–5.1)
Alkaline Phosphatase: 52 U/L (ref 40–115)
BILIRUBIN DIRECT: 0.1 mg/dL (ref <=0.2)
Indirect Bilirubin: 0.3 mg/dL (ref 0.2–1.2)
TOTAL PROTEIN: 6.8 g/dL (ref 6.1–8.1)
Total Bilirubin: 0.4 mg/dL (ref 0.2–1.2)

## 2016-03-08 LAB — MAGNESIUM: MAGNESIUM: 2.3 mg/dL (ref 1.5–2.5)

## 2016-03-08 LAB — LIPID PANEL
CHOLESTEROL: 175 mg/dL (ref 125–200)
HDL: 79 mg/dL (ref >=40)
LDL Cholesterol: 56 mg/dL (ref <130)
Total CHOL/HDL Ratio: 2.2 Ratio (ref <=5.0)
Triglycerides: 200 mg/dL — ABNORMAL HIGH (ref <150)
VLDL: 40 mg/dL — ABNORMAL HIGH (ref <30)

## 2016-03-08 LAB — HEMOGLOBIN A1C
HEMOGLOBIN A1C: 4.9 % (ref <5.7)
MEAN PLASMA GLUCOSE: 94 mg/dL

## 2016-03-08 LAB — BASIC METABOLIC PANEL WITH GFR
BUN: 16 mg/dL (ref 7–25)
CALCIUM: 9 mg/dL (ref 8.6–10.3)
CHLORIDE: 107 mmol/L (ref 98–110)
CO2: 23 mmol/L (ref 20–31)
CREATININE: 0.86 mg/dL (ref 0.70–1.25)
GFR, Est African American: >89 mL/min (ref >=60)
GFR, Est Non African American: >89 mL/min (ref >=60)
GLUCOSE: 91 mg/dL (ref 65–99)
Potassium: 4.3 mmol/L (ref 3.5–5.3)
Sodium: 140 mmol/L (ref 135–146)

## 2016-03-08 LAB — INSULIN, RANDOM: INSULIN: 28.9 u[IU]/mL — AB (ref 2.0–19.6)

## 2016-03-08 LAB — TESTOSTERONE: TESTOSTERONE: 244 ng/dL — AB (ref 250–827)

## 2016-03-08 LAB — VITAMIN D 25 HYDROXY (VIT D DEFICIENCY, FRACTURES): VIT D 25 HYDROXY: 53 ng/mL (ref 30–100)

## 2016-03-16 ENCOUNTER — Other Ambulatory Visit: Payer: Self-pay | Admitting: Physician Assistant

## 2016-03-16 NOTE — Telephone Encounter (Signed)
Rx called into [facility] pharmacy. 

## 2016-06-07 ENCOUNTER — Ambulatory Visit: Payer: Self-pay | Admitting: Physician Assistant

## 2016-06-14 ENCOUNTER — Encounter: Payer: Self-pay | Admitting: Physician Assistant

## 2016-06-14 ENCOUNTER — Ambulatory Visit (INDEPENDENT_AMBULATORY_CARE_PROVIDER_SITE_OTHER): Payer: Medicare Other | Admitting: Physician Assistant

## 2016-06-14 ENCOUNTER — Other Ambulatory Visit: Payer: Self-pay | Admitting: Physician Assistant

## 2016-06-14 VITALS — BP 134/80 | HR 60 | Temp 97.3°F | Resp 16 | Ht 73.25 in | Wt 207.0 lb

## 2016-06-14 DIAGNOSIS — Z0000 Encounter for general adult medical examination without abnormal findings: Secondary | ICD-10-CM | POA: Diagnosis not present

## 2016-06-14 DIAGNOSIS — Z0001 Encounter for general adult medical examination with abnormal findings: Secondary | ICD-10-CM

## 2016-06-14 DIAGNOSIS — E785 Hyperlipidemia, unspecified: Secondary | ICD-10-CM | POA: Diagnosis not present

## 2016-06-14 DIAGNOSIS — Z23 Encounter for immunization: Secondary | ICD-10-CM

## 2016-06-14 DIAGNOSIS — Z79899 Other long term (current) drug therapy: Secondary | ICD-10-CM | POA: Diagnosis not present

## 2016-06-14 DIAGNOSIS — R7303 Prediabetes: Secondary | ICD-10-CM

## 2016-06-14 DIAGNOSIS — E559 Vitamin D deficiency, unspecified: Secondary | ICD-10-CM

## 2016-06-14 DIAGNOSIS — E349 Endocrine disorder, unspecified: Secondary | ICD-10-CM | POA: Diagnosis not present

## 2016-06-14 DIAGNOSIS — R6889 Other general symptoms and signs: Secondary | ICD-10-CM | POA: Diagnosis not present

## 2016-06-14 DIAGNOSIS — I10 Essential (primary) hypertension: Secondary | ICD-10-CM

## 2016-06-14 NOTE — Progress Notes (Signed)
WELCOME TO MEDICARE ANNUAL WELLNESS VISIT AND FOLLOW UP Assessment:   1. Hypertension -Continue medication, monitor blood pressure at home. Continue DASH diet.  Reminder to go to the ER if any CP, SOB, nausea, dizziness, severe HA, changes vision/speech, left arm numbness and tingling and jaw pain.  2. Cholesterol -Continue diet and exercise. Check cholesterol.   3. Prediabetes  -Continue diet and exercise. Check A1C  4. Vitamin D Def - check level and continue medications.   5. Hypogonadism-  continue replacement therapy, increase to 3 pumps a day, check testosterone levels as needed.    Future Appointments Date Time Provider Department Center  10/09/2016 3:00 PM Lucky CowboyWilliam McKeown, MD [facility] None     Plan:   During the course of the visit the patient was educated and counseled about appropriate screening and preventive services including:    Pneumococcal vaccine   Influenza vaccine  Prevnar 13  Td vaccine  Screening electrocardiogram  Colorectal cancer screening  Diabetes screening  Glaucoma screening  Nutrition counseling    Continue diet and meds as discussed. Further disposition pending results of labs. Over 30 minutes of exam, counseling, chart review, and critical decision making was performed  HPI 65 y.o. male  presents for 3 month follow up on hypertension, cholesterol, hypogonadism, and vitamin D deficiency and Welcome to medicare visit  His blood pressure has been controlled at home, today their BP is BP: 134/80  He does workout. He denies chest pain, shortness of breath, dizziness.  He is on cholesterol medication and denies myalgias. His cholesterol is not at goal. The cholesterol last visit was:   Lab Results  Component Value Date   CHOL 175 03/07/2016   HDL 79 03/07/2016   LDLCALC 56 03/07/2016   TRIG 200 (H) 03/07/2016   CHOLHDL 2.2 03/07/2016  Last A1C in the office was:  Lab Results  Component Value Date   HGBA1C 4.9 03/07/2016   Patient is on Vitamin D supplement.   Lab Results  Component Value Date   VD25OH 2253 03/07/2016    He has a history of testosterone deficiency and is on testosterone replacement with the gel, two pumps daily a day.  He states that the testosterone helps with his energy, libido, muscle mass but not much of a difference. Lab Results  Component Value Date   TESTOSTERONE 244 (L) 03/07/2016   BMI is Body mass index is 27.12 kg/m., he is working on diet and exercise. Wt Readings from Last 3 Encounters:  06/14/16 207 lb (93.9 kg)  03/07/16 207 lb 12.8 oz (94.3 kg)  02/15/16 207 lb (93.9 kg)    Current Medications:  Current Outpatient Prescriptions on File Prior to Visit  Medication Sig Dispense Refill  . ANDROGEL PUMP 20.25 MG/ACT (1.62%) GEL APPLY 1 PUMP TO EACH ARM AS DIRECTED 75 g 1  . aspirin 81 MG tablet Take 81 mg by mouth 3 (three) times a week.    Marland Kitchen. atenolol (TENORMIN) 100 MG tablet TAKE 1 TABLET BY MOUTH EVERY DAY 30 tablet 0  . Cholecalciferol (VITAMIN D PO) Take by mouth. Takes 4000 units daily    . CRESTOR 40 MG tablet TAKE 1 TABLET BY MOUTH EVERY DAY 90 tablet 1  . glucosamine-chondroitin 500-400 MG tablet Take 1 tablet by mouth 2 (two) times daily.    . Multiple Vitamin (MULTIVITAMIN) tablet Take 1 tablet by mouth daily.    . Omega-3 Fatty Acids (FISH OIL) 1000 MG CAPS Take by mouth daily.    .Marland Kitchen  vitamin C (ASCORBIC ACID) 500 MG tablet Take 500 mg by mouth daily.     No current facility-administered medications on file prior to visit.    Medical History:  Past Medical History:  Diagnosis Date  . Hyperlipidemia   . Hypertension   . Other testicular hypofunction   . Vitamin D deficiency    Screening Tests Immunization History  Administered Date(s) Administered  . Influenza, Seasonal, Injecte, Preservative Fre 06/21/2015  . Influenza-Unspecified 07/01/2014  . PPD Test 08/13/2014, 08/30/2015  . Pneumococcal Conjugate-13 08/13/2014  . Pneumococcal-Unspecified  07/09/1997  . Td 07/09/2004  . Tdap 08/13/2014  . Zoster 06/29/2014   Preventative care: Last colonoscopy: 06/2014 CXR 2002  Prior vaccinations: TD or Tdap: 2016  Influenza: 2017  Pneumococcal: 1999 DUE Prevnar13: 2016 Shingles/Zostavax: 2015  Names of Other Physician/Practitioners you currently use: 1. [facility] here for primary care 2. None, eye doctor, last visit 10 years 3. Dr. Laural Benes, dentist, last visit 06/10/2016 Patient Care Team: Lucky Cowboy, MD as PCP - General (Internal Medicine)  Allergies No Known Allergies  SURGICAL HISTORY He  has a past surgical history that includes Appendectomy (1960); Ankle surgery (Left, 1983); Tonsillectomy (1958); and Knee arthroscopy (Left, 1986, 1987). FAMILY HISTORY His family history includes Colon cancer (age of onset: 79) in his maternal uncle; Colon cancer (age of onset: 78) in his mother. SOCIAL HISTORY He  reports that he has been smoking Cigars.  He has never used smokeless tobacco. He reports that he drinks about 6.0 oz of alcohol per week . He reports that he does not use drugs.  MEDICARE WELLNESS OBJECTIVES: Physical activity: Current Exercise Habits: Structured exercise class, Type of exercise: strength training/weights;treadmill, Time (Minutes): 30, Frequency (Times/Week): 4, Weekly Exercise (Minutes/Week): 120, Intensity: Moderate Cardiac risk factors: Cardiac Risk Factors include: advanced age (>54men, >55 women);dyslipidemia;hypertension;male gender Depression/mood screen:   Depression screen [facility] 2/9 06/14/2016  Decreased Interest 0  Down, Depressed, Hopeless 0  PHQ - 2 Score 0    ADLs:  In your present state of health, do you have any difficulty performing the following activities: 06/14/2016 03/07/2016  Hearing? N N  Vision? N N  Difficulty concentrating or making decisions? N N  Walking or climbing stairs? N N  Dressing or bathing? N N  Doing errands, shopping? N N   Some recent data might be hidden     Cognitive Testing  Alert? Yes  Normal Appearance?Yes  Oriented to person? Yes  Place? Yes   Time? Yes  Recall of three objects?  Yes  Can perform simple calculations? Yes  Displays appropriate judgment?Yes  Can read the correct time from a watch face?Yes  EOL planning: Does Patient Have a Medical Advance Directive?: Yes Type of Advance Directive: Healthcare Power of Attorney, Living will Does patient want to make changes to medical advance directive?: No - Patient declined Copy of Healthcare Power of Attorney in Chart?: No - copy requested  Review of Systems:  Review of Systems  Constitutional: Negative.   HENT: Negative.   Eyes: Negative.   Respiratory: Negative.   Cardiovascular: Negative.   Gastrointestinal: Negative.   Genitourinary: Negative.   Musculoskeletal: Negative.   Skin: Negative.   Neurological: Negative.   Endo/Heme/Allergies: Negative.   Psychiatric/Behavioral: Negative.    Physical Exam: BP 134/80   Pulse 60   Temp 97.3 F (36.3 C)   Resp 16   Ht 6' 1.25" (1.861 m)   Wt 207 lb (93.9 kg)   SpO2 98%  BMI 27.12 kg/m  Wt Readings from Last 3 Encounters:  06/14/16 207 lb (93.9 kg)  03/07/16 207 lb 12.8 oz (94.3 kg)  02/15/16 207 lb (93.9 kg)   General Appearance: Well nourished, in no apparent distress. Eyes: PERRLA, EOMs, conjunctiva no swelling or erythema Sinuses: No Frontal/maxillary tenderness ENT/Mouth: Ext aud canals clear, TMs without erythema, bulging. No erythema, swelling, or exudate on post pharynx.  Tonsils not swollen or erythematous. Hearing normal.  Neck: Supple, thyroid normal.  Respiratory: Respiratory effort normal, BS equal bilaterally without rales, rhonchi, wheezing or stridor.  Cardio: RRR with no MRGs. Brisk peripheral pulses without edema.  Abdomen: Soft, + BS,  Non tender, no guarding, rebound, hernias, masses. Lymphatics: Non tender without lymphadenopathy.  Musculoskeletal: Full  ROM, 5/5 strength, Normal gait Skin: Warm, dry without rashes, lesions, ecchymosis.  Neuro: Cranial nerves intact. Normal muscle tone, no cerebellar symptoms. Psych: Awake and oriented X 3, normal affect, Insight and Judgment appropriate.   Medicare Attestation I have personally reviewed: The patient's medical and social history Their use of alcohol, tobacco or illicit drugs Their current medications and supplements The patient's functional ability including ADLs,fall risks, home safety risks, cognitive, and hearing and visual impairment Diet and physical activities Evidence for depression or mood disorders  The patient's weight, height, BMI, and visual acuity have been recorded in the chart.  I have made referrals, counseling, and provided education to the patient based on review of the above and I have provided the patient with a written personalized care plan for preventive services.      Quentin MullingAmanda Collier, PA-C 4:31 PM [facility]

## 2016-06-15 LAB — CBC WITH DIFFERENTIAL/PLATELET
BASOS ABS: 0 {cells}/uL (ref 0–200)
Basophils Relative: 0 %
EOS PCT: 3 %
Eosinophils Absolute: 201 cells/uL (ref 15–500)
HEMATOCRIT: 44.8 % (ref 38.5–50.0)
HEMOGLOBIN: 15.4 g/dL (ref 13.2–17.1)
LYMPHS ABS: 3953 {cells}/uL — AB (ref 850–3900)
Lymphocytes Relative: 59 %
MCH: 31.1 pg (ref 27.0–33.0)
MCHC: 34.4 g/dL (ref 32.0–36.0)
MCV: 90.5 fL (ref 80.0–100.0)
MONO ABS: 603 {cells}/uL (ref 200–950)
MPV: 9.4 fL (ref 7.5–12.5)
Monocytes Relative: 9 %
NEUTROS ABS: 1943 {cells}/uL (ref 1500–7800)
NEUTROS PCT: 29 %
Platelets: 228 10*3/uL (ref 140–400)
RBC: 4.95 MIL/uL (ref 4.20–5.80)
RDW: 14 % (ref 11.0–15.0)
WBC: 6.7 10*3/uL (ref 3.8–10.8)

## 2016-06-15 LAB — BASIC METABOLIC PANEL WITH GFR
BUN: 14 mg/dL (ref 7–25)
CHLORIDE: 106 mmol/L (ref 98–110)
CO2: 26 mmol/L (ref 20–31)
Calcium: 9.3 mg/dL (ref 8.6–10.3)
Creat: 1 mg/dL (ref 0.70–1.25)
GFR, EST NON AFRICAN AMERICAN: 79 mL/min (ref >=60)
GLUCOSE: 77 mg/dL (ref 65–99)
POTASSIUM: 4 mmol/L (ref 3.5–5.3)
Sodium: 140 mmol/L (ref 135–146)

## 2016-06-15 LAB — HEPATIC FUNCTION PANEL
ALBUMIN: 4.3 g/dL (ref 3.6–5.1)
ALT: 30 U/L (ref 9–46)
AST: 25 U/L (ref 10–35)
Alkaline Phosphatase: 49 U/L (ref 40–115)
Bilirubin, Direct: 0.1 mg/dL (ref <=0.2)
Indirect Bilirubin: 0.5 mg/dL (ref 0.2–1.2)
TOTAL PROTEIN: 6.6 g/dL (ref 6.1–8.1)
Total Bilirubin: 0.6 mg/dL (ref 0.2–1.2)

## 2016-06-15 LAB — LIPID PANEL
CHOL/HDL RATIO: 2.4 ratio (ref <5.0)
CHOLESTEROL: 159 mg/dL (ref <200)
HDL: 67 mg/dL (ref >40)
LDL CALC: 71 mg/dL (ref <100)
TRIGLYCERIDES: 107 mg/dL (ref <150)
VLDL: 21 mg/dL (ref <30)

## 2016-06-15 LAB — TSH: TSH: 2.21 m[IU]/L (ref 0.40–4.50)

## 2016-06-15 LAB — MAGNESIUM: MAGNESIUM: 2.1 mg/dL (ref 1.5–2.5)

## 2016-06-15 LAB — TESTOSTERONE: TESTOSTERONE: 322 ng/dL (ref 250–827)

## 2016-07-17 ENCOUNTER — Other Ambulatory Visit: Payer: Self-pay | Admitting: Internal Medicine

## 2016-07-17 MED ORDER — AZITHROMYCIN 250 MG PO TABS: ORAL_TABLET | ORAL | 0 refills | Status: DC

## 2016-08-08 ENCOUNTER — Other Ambulatory Visit: Payer: Self-pay | Admitting: Internal Medicine

## 2016-10-05 ENCOUNTER — Other Ambulatory Visit: Payer: Self-pay | Admitting: Internal Medicine

## 2016-10-09 ENCOUNTER — Encounter: Payer: Self-pay | Admitting: Internal Medicine

## 2016-10-09 ENCOUNTER — Ambulatory Visit (INDEPENDENT_AMBULATORY_CARE_PROVIDER_SITE_OTHER): Payer: Medicare Other | Admitting: Internal Medicine

## 2016-10-09 VITALS — BP 122/80 | HR 84 | Temp 97.3°F | Resp 16 | Ht 73.0 in | Wt 209.2 lb

## 2016-10-09 DIAGNOSIS — R7309 Other abnormal glucose: Secondary | ICD-10-CM | POA: Diagnosis not present

## 2016-10-09 DIAGNOSIS — I700 Atherosclerosis of aorta: Secondary | ICD-10-CM

## 2016-10-09 DIAGNOSIS — Z125 Encounter for screening for malignant neoplasm of prostate: Secondary | ICD-10-CM | POA: Diagnosis not present

## 2016-10-09 DIAGNOSIS — Z1212 Encounter for screening for malignant neoplasm of rectum: Secondary | ICD-10-CM

## 2016-10-09 DIAGNOSIS — Z79899 Other long term (current) drug therapy: Secondary | ICD-10-CM

## 2016-10-09 DIAGNOSIS — Z136 Encounter for screening for cardiovascular disorders: Secondary | ICD-10-CM

## 2016-10-09 DIAGNOSIS — E349 Endocrine disorder, unspecified: Secondary | ICD-10-CM | POA: Diagnosis not present

## 2016-10-09 DIAGNOSIS — E559 Vitamin D deficiency, unspecified: Secondary | ICD-10-CM

## 2016-10-09 DIAGNOSIS — I10 Essential (primary) hypertension: Secondary | ICD-10-CM | POA: Diagnosis not present

## 2016-10-09 DIAGNOSIS — R7303 Prediabetes: Secondary | ICD-10-CM

## 2016-10-09 DIAGNOSIS — E782 Mixed hyperlipidemia: Secondary | ICD-10-CM

## 2016-10-09 DIAGNOSIS — N320 Bladder-neck obstruction: Secondary | ICD-10-CM | POA: Diagnosis not present

## 2016-10-09 LAB — BASIC METABOLIC PANEL WITH GFR
BUN: 16 mg/dL (ref 7–25)
CO2: 21 mmol/L (ref 20–31)
CREATININE: 0.96 mg/dL (ref 0.70–1.25)
Calcium: 9.2 mg/dL (ref 8.6–10.3)
Chloride: 105 mmol/L (ref 98–110)
GFR, EST NON AFRICAN AMERICAN: 83 mL/min (ref >=60)
GFR, Est African American: >89 mL/min (ref >=60)
GLUCOSE: 123 mg/dL — AB (ref 65–99)
Potassium: 4.1 mmol/L (ref 3.5–5.3)
Sodium: 139 mmol/L (ref 135–146)

## 2016-10-09 LAB — LIPID PANEL
Cholesterol: 171 mg/dL (ref <200)
HDL: 79 mg/dL (ref >40)
LDL CALC: 68 mg/dL (ref <100)
Total CHOL/HDL Ratio: 2.2 Ratio (ref <5.0)
Triglycerides: 119 mg/dL (ref <150)
VLDL: 24 mg/dL (ref <30)

## 2016-10-09 LAB — CBC WITH DIFFERENTIAL/PLATELET
BASOS ABS: 0 {cells}/uL (ref 0–200)
Basophils Relative: 0 %
EOS ABS: 128 {cells}/uL (ref 15–500)
Eosinophils Relative: 2 %
HEMATOCRIT: 45.1 % (ref 38.5–50.0)
HEMOGLOBIN: 15.6 g/dL (ref 13.2–17.1)
LYMPHS ABS: 2944 {cells}/uL (ref 850–3900)
LYMPHS PCT: 46 %
MCH: 31.1 pg (ref 27.0–33.0)
MCHC: 34.6 g/dL (ref 32.0–36.0)
MCV: 90 fL (ref 80.0–100.0)
MPV: 9.4 fL (ref 7.5–12.5)
Monocytes Absolute: 512 cells/uL (ref 200–950)
Monocytes Relative: 8 %
NEUTROS PCT: 44 %
Neutro Abs: 2816 cells/uL (ref 1500–7800)
Platelets: 243 10*3/uL (ref 140–400)
RBC: 5.01 MIL/uL (ref 4.20–5.80)
RDW: 13.8 % (ref 11.0–15.0)
WBC: 6.4 10*3/uL (ref 3.8–10.8)

## 2016-10-09 LAB — HEPATIC FUNCTION PANEL
ALBUMIN: 4.3 g/dL (ref 3.6–5.1)
ALT: 32 U/L (ref 9–46)
AST: 27 U/L (ref 10–35)
Alkaline Phosphatase: 48 U/L (ref 40–115)
BILIRUBIN INDIRECT: 0.5 mg/dL (ref 0.2–1.2)
Bilirubin, Direct: 0.1 mg/dL (ref <=0.2)
TOTAL PROTEIN: 6.9 g/dL (ref 6.1–8.1)
Total Bilirubin: 0.6 mg/dL (ref 0.2–1.2)

## 2016-10-09 NOTE — Patient Instructions (Signed)

## 2016-10-09 NOTE — Progress Notes (Signed)
[place] [facility]   Lucky Cowboy, M.D.    Dyanne Carrel. Steffanie Dunn, P.A.-C      Terri Piedra, P.A.-C  [facility]                [address]                [place], [place]. [address] Telephone [phone] Telefax [phone] Annual  Screening/Preventative Visit  & Comprehensive Evaluation & Examination     This very nice 66 y.o. MWM presents for a Screening/Preventative Visit & comprehensive evaluation and management of multiple medical co-morbidities.  Patient has been followed for HTN, Prediabetes, Hyperlipidemia and Vitamin D Deficiency.     HTN predates since 2006. Patient's BP has been controlled at home.  Today's BP is at goal - 122/80. Patient denies any cardiac symptoms as chest pain, palpitations, shortness of breath, dizziness or ankle swelling.     Patient's hyperlipidemia is controlled with diet and medications. Patient denies myalgias or other medication SE's. Last lipids were  Lab Results  Component Value Date   CHOL 159 06/14/2016   HDL 67 06/14/2016   LDLCALC 71 06/14/2016   TRIG 107 06/14/2016   CHOLHDL 2.4 06/14/2016      Patient is screened proactively and expectantly for prediabetes and patient denies reactive hypoglycemic symptoms, visual blurring, diabetic polys or paresthesias. Last A1c was at goal: Lab Results  Component Value Date   HGBA1C 4.9 03/07/2016        Patient has hx/o Low Testosterone 228.87 in 2013 and is on replacement with Androgel and reports improved sense of well-being and stamina. Finally, patient has history of Vitamin D Deficiency ("28" in 2008) and last vitamin D was near goal (goal 70-100): Lab Results  Component Value Date   VD25OH 53 03/07/2016   Current Outpatient Prescriptions on File Prior to Visit  Medication Sig  . ANDROGEL PUMP 1.62% GEL APPLY 1 PUMP TO EACH ARM AS DIRECTED  . aspirin 81 MG tablet Take  3x a week.  . Atenolol 100 MG TAKE 1 TAB EVERY DAY  .  VITAMIN D Takes 4000 units daily  . glucosamine-chondroitin  Take 1 tab 2 x daily.  . Multiple Vitamin  Take 1 tab daily.  . Omega-3 FISH OIL 1000 MG  Take  daily.  . rosuvastatin 40 MG TAKE 1 TAB EVERY DAY  . vitamin C  500 MG Take  daily.   No Known Allergies   Past Medical History:  Diagnosis Date  . Hyperlipidemia   . Hypertension   . Other testicular hypofunction   . Vitamin D deficiency    Health Maintenance  Topic Date Due  . INFLUENZA VACCINE  02/06/2017  . COLONOSCOPY  07/31/2018  . TETANUS/TDAP  08/13/2024  . Hepatitis C Screening  Completed  . HIV Screening  Completed  . PNA vac Low Risk Adult  Completed   Immunization History  Administered Date(s) Administered  . Influenza, Seasonal, Injecte, Preservative Fre 06/21/2015  . Influenza-Unspecified 07/01/2014  . PPD Test 08/13/2014, 08/30/2015  . Pneumococcal Conjugate-13 08/13/2014  . Pneumococcal Polysaccharide-23 06/14/2016  . Pneumococcal-Unspecified 07/09/1997  . Td 07/09/2004  . Tdap 08/13/2014  . Zoster 06/29/2014   Past Surgical History:  Procedure Laterality Date  . ANKLE SURGERY Left 1983   for bone spur  . APPENDECTOMY  1960  . KNEE ARTHROSCOPY Left 1986, 1987  . TONSILLECTOMY  1958   Family History  Problem Relation Age of Onset  .  Colon cancer Mother 19  . Colon cancer Maternal Uncle 36   Social History   Social History  . Marital status: Married    Spouse name: N/A  . Number of children: 2  . Years of education: Atty   Occupational History   Attorney   Social History Main Topics  . Smoking status: Current Some Day Smoker    Types: Cigars  . Smokeless tobacco: Never Used     Comment: rarely smokes a  cigar  . Alcohol use 6.0 oz/week    10 Cans of beer per week  . Drug use: No  . Sexual activity: Not on file     ROS Constitutional: Denies fever, chills, weight loss/gain, headaches, insomnia,  night sweats or change in appetite. Does c/o fatigue. Eyes: Denies redness,  blurred vision, diplopia, discharge, itchy or watery eyes.  ENT: Denies discharge, congestion, post nasal drip, epistaxis, sore throat, earache, hearing loss, dental pain, Tinnitus, Vertigo, Sinus pain or snoring.  Cardio: Denies chest pain, palpitations, irregular heartbeat, syncope, dyspnea, diaphoresis, orthopnea, PND, claudication or edema Respiratory: denies cough, dyspnea, DOE, pleurisy, hoarseness, laryngitis or wheezing.  Gastrointestinal: Denies dysphagia, heartburn, reflux, water brash, pain, cramps, nausea, vomiting, bloating, diarrhea, constipation, hematemesis, melena, hematochezia, jaundice or hemorrhoids Genitourinary: Denies dysuria, frequency, urgency, nocturia, hesitancy, discharge, hematuria or flank pain Musculoskeletal: Denies arthralgia, myalgia, stiffness, Jt. Swelling, pain, limp or strain/sprain. Denies Falls. Skin: Denies puritis, rash, hives, warts, acne, eczema or change in skin lesion Neuro: No weakness, tremor, incoordination, spasms, paresthesia or pain Psychiatric: Denies confusion, memory loss or sensory loss. Denies Depression. Endocrine: Denies change in weight, skin, hair change, nocturia, and paresthesia, diabetic polys, visual blurring or hyper / hypo glycemic episodes.  Heme/Lymph: No excessive bleeding, bruising or enlarged lymph nodes.  Physical Exam  BP 122/80   Pulse 84   Temp 97.3 F (36.3 C)   Resp 16   Ht  (1.854 m)   Wt 209 lb 3.2 oz (94.9 kg)   BMI 27.60 kg/m   General Appearance: Well nourished and well groomed and in no apparent distress.  Eyes: PERRLA, EOMs, conjunctiva no swelling or erythema, normal fundi and vessels. Sinuses: No frontal/maxillary tenderness ENT/Mouth: EACs patent / TMs  nl. Nares clear without erythema, swelling, mucoid exudates. Oral hygiene is good. No erythema, swelling, or exudate. Tongue normal, non-obstructing. Tonsils not swollen or erythematous. Hearing normal.  Neck: Supple, thyroid normal. No bruits,  nodes or JVD. Respiratory: Respiratory effort normal.  BS equal and clear bilateral without rales, rhonci, wheezing or stridor. Cardio: Heart sounds are normal with regular rate and rhythm and no murmurs, rubs or gallops. Peripheral pulses are normal and equal bilaterally without edema. No aortic or femoral bruits. Chest: symmetric with normal excursions and percussion.  Abdomen: Soft, with Nl bowel sounds. Nontender, no guarding, rebound, hernias, masses, or organomegaly.  Lymphatics: Non tender without lymphadenopathy.  Genitourinary: No hernias.Testes nl. DRE - prostate nl for age - smooth & firm w/o nodules. Musculoskeletal: Full ROM all peripheral extremities, joint stability, 5/5 strength, and normal gait. Skin: Warm and dry without rashes, lesions, cyanosis, clubbing or  ecchymosis.  Neuro: Cranial nerves intact, reflexes equal bilaterally. Normal muscle tone, no cerebellar symptoms. Sensation intact.  Pysch: Alert and oriented X 3 with normal affect, insight and judgment appropriate.   Assessment and Plan  1. Essential hypertension  - EKG 12-Lead - [place], RETROPERITNL ABD,  LTD - Urinalysis, Routine w reflex microscopic - Microalbumin / creatinine urine ratio - CBC with  Differential/Platelet - BASIC METABOLIC PANEL WITH GFR - Magnesium - TSH  2. Mixed hyperlipidemia  - EKG 12-Lead - [place], RETROPERITNL ABD,  LTD - Hepatic function panel - Lipid panel - TSH  3. Prediabetes  - EKG 12-Lead - [place], RETROPERITNL ABD,  LTD - Hemoglobin A1c - Insulin, random  4. Vitamin D deficiency  - VITAMIN D 25 Hydroxy  5. Abnormal glucose  - Hemoglobin A1c - Insulin, random  6. Testosterone deficiency  - Testosterone  7. Screening for rectal cancer  - POC Hemoccult Bld/Stl   8. Prostate cancer screening  - PSA  9. Screening for ischemic heart disease  - EKG 12-Lead  10. Screening for AAA (aortic abdominal aneurysm)  - [place], RETROPERITNL ABD,  LTD  11. 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 Aortic atherosclerosis (HCC)  - [place], RETROPERITNL ABD,  LTD       Patient was counseled in prudent diet, weight contro to achieve/maintain BMI less than 25, BP monitoring, regular exercise and medications as discussed.  Discussed med effects and SE's. Routine screening labs and tests as requested with regular follow-up as recommended. Over 40 minutes of exam, counseling, chart review and high complex critical decision making was performed

## 2016-10-10 LAB — MICROALBUMIN / CREATININE URINE RATIO
CREATININE, URINE: 126 mg/dL (ref 20–370)
MICROALB UR: 0.3 mg/dL
MICROALB/CREAT RATIO: 2 ug/mg{creat} (ref <30)

## 2016-10-10 LAB — URINALYSIS, ROUTINE W REFLEX MICROSCOPIC
Bilirubin Urine: NEGATIVE
Glucose, UA: NEGATIVE
HGB URINE DIPSTICK: NEGATIVE
KETONES UR: NEGATIVE
LEUKOCYTES UA: NEGATIVE
NITRITE: NEGATIVE
PH: 5.5 (ref 5.0–8.0)
Protein, ur: NEGATIVE
Specific Gravity, Urine: 1.019 (ref 1.001–1.035)

## 2016-10-10 LAB — TSH: TSH: 1.86 m[IU]/L (ref 0.40–4.50)

## 2016-10-10 LAB — INSULIN, RANDOM: INSULIN: 58.7 u[IU]/mL — AB (ref 2.0–19.6)

## 2016-10-10 LAB — HEMOGLOBIN A1C
Hgb A1c MFr Bld: 4.8 % (ref <5.7)
MEAN PLASMA GLUCOSE: 91 mg/dL

## 2016-10-10 LAB — VITAMIN D 25 HYDROXY (VIT D DEFICIENCY, FRACTURES): VIT D 25 HYDROXY: 47 ng/mL (ref 30–100)

## 2016-10-10 LAB — MAGNESIUM: Magnesium: 2.1 mg/dL (ref 1.5–2.5)

## 2016-10-10 LAB — PSA: PSA: 1 ng/mL (ref <=4.0)

## 2016-10-10 LAB — TESTOSTERONE: Testosterone: 385 ng/dL (ref 250–827)

## 2016-10-31 ENCOUNTER — Other Ambulatory Visit: Payer: Self-pay

## 2016-10-31 DIAGNOSIS — Z1212 Encounter for screening for malignant neoplasm of rectum: Secondary | ICD-10-CM

## 2016-10-31 LAB — POC HEMOCCULT BLD/STL (HOME/3-CARD/SCREEN)
FECAL OCCULT BLD: NEGATIVE
FECAL OCCULT BLD: NEGATIVE
Fecal Occult Blood, POC: NEGATIVE

## 2016-11-06 HISTORY — PX: PATELLAR TENDON REPAIR: SHX737

## 2016-11-08 DIAGNOSIS — S76111A Strain of right quadriceps muscle, fascia and tendon, initial encounter: Secondary | ICD-10-CM | POA: Diagnosis not present

## 2016-11-08 DIAGNOSIS — M791 Myalgia: Secondary | ICD-10-CM | POA: Diagnosis not present

## 2016-11-08 DIAGNOSIS — M25461 Effusion, right knee: Secondary | ICD-10-CM | POA: Diagnosis not present

## 2016-11-08 DIAGNOSIS — M25561 Pain in right knee: Secondary | ICD-10-CM | POA: Diagnosis not present

## 2016-11-08 DIAGNOSIS — M7651 Patellar tendinitis, right knee: Secondary | ICD-10-CM | POA: Diagnosis not present

## 2016-11-08 DIAGNOSIS — I10 Essential (primary) hypertension: Secondary | ICD-10-CM | POA: Diagnosis not present

## 2016-11-08 DIAGNOSIS — E785 Hyperlipidemia, unspecified: Secondary | ICD-10-CM | POA: Diagnosis not present

## 2016-11-08 DIAGNOSIS — S86811A Strain of other muscle(s) and tendon(s) at lower leg level, right leg, initial encounter: Secondary | ICD-10-CM | POA: Diagnosis not present

## 2016-11-12 DIAGNOSIS — S86811A Strain of other muscle(s) and tendon(s) at lower leg level, right leg, initial encounter: Secondary | ICD-10-CM | POA: Diagnosis not present

## 2016-11-15 DIAGNOSIS — M66261 Spontaneous rupture of extensor tendons, right lower leg: Secondary | ICD-10-CM | POA: Diagnosis not present

## 2016-11-29 ENCOUNTER — Other Ambulatory Visit: Payer: Self-pay | Admitting: Internal Medicine

## 2016-11-29 MED ORDER — TAMSULOSIN HCL 0.4 MG PO CAPS: ORAL_CAPSULE | ORAL | 0 refills | Status: DC

## 2016-11-30 DIAGNOSIS — M66261 Spontaneous rupture of extensor tendons, right lower leg: Secondary | ICD-10-CM | POA: Diagnosis not present

## 2016-12-02 ENCOUNTER — Other Ambulatory Visit: Payer: Self-pay | Admitting: Physician Assistant

## 2016-12-02 NOTE — Telephone Encounter (Signed)
Please call Androgel pump 

## 2016-12-05 DIAGNOSIS — S86811D Strain of other muscle(s) and tendon(s) at lower leg level, right leg, subsequent encounter: Secondary | ICD-10-CM | POA: Diagnosis not present

## 2016-12-06 DIAGNOSIS — M66261 Spontaneous rupture of extensor tendons, right lower leg: Secondary | ICD-10-CM | POA: Diagnosis not present

## 2016-12-26 ENCOUNTER — Other Ambulatory Visit: Payer: Self-pay | Admitting: Internal Medicine

## 2016-12-31 DIAGNOSIS — M66261 Spontaneous rupture of extensor tendons, right lower leg: Secondary | ICD-10-CM | POA: Diagnosis not present

## 2017-01-07 DIAGNOSIS — M66861 Spontaneous rupture of other tendons, right lower leg: Secondary | ICD-10-CM | POA: Diagnosis not present

## 2017-01-07 DIAGNOSIS — M25661 Stiffness of right knee, not elsewhere classified: Secondary | ICD-10-CM | POA: Diagnosis not present

## 2017-01-07 DIAGNOSIS — M25561 Pain in right knee: Secondary | ICD-10-CM | POA: Diagnosis not present

## 2017-01-07 DIAGNOSIS — S76191D Other specified injury of right quadriceps muscle, fascia and tendon, subsequent encounter: Secondary | ICD-10-CM | POA: Diagnosis not present

## 2017-01-08 DIAGNOSIS — M66861 Spontaneous rupture of other tendons, right lower leg: Secondary | ICD-10-CM | POA: Diagnosis not present

## 2017-01-08 DIAGNOSIS — M25561 Pain in right knee: Secondary | ICD-10-CM | POA: Diagnosis not present

## 2017-01-08 DIAGNOSIS — M25661 Stiffness of right knee, not elsewhere classified: Secondary | ICD-10-CM | POA: Diagnosis not present

## 2017-01-08 DIAGNOSIS — S76191D Other specified injury of right quadriceps muscle, fascia and tendon, subsequent encounter: Secondary | ICD-10-CM | POA: Diagnosis not present

## 2017-01-11 DIAGNOSIS — M66861 Spontaneous rupture of other tendons, right lower leg: Secondary | ICD-10-CM | POA: Diagnosis not present

## 2017-01-11 DIAGNOSIS — S76191D Other specified injury of right quadriceps muscle, fascia and tendon, subsequent encounter: Secondary | ICD-10-CM | POA: Diagnosis not present

## 2017-01-11 DIAGNOSIS — M25661 Stiffness of right knee, not elsewhere classified: Secondary | ICD-10-CM | POA: Diagnosis not present

## 2017-01-11 DIAGNOSIS — M25561 Pain in right knee: Secondary | ICD-10-CM | POA: Diagnosis not present

## 2017-01-15 DIAGNOSIS — M25561 Pain in right knee: Secondary | ICD-10-CM | POA: Diagnosis not present

## 2017-01-15 DIAGNOSIS — M25661 Stiffness of right knee, not elsewhere classified: Secondary | ICD-10-CM | POA: Diagnosis not present

## 2017-01-15 DIAGNOSIS — M66861 Spontaneous rupture of other tendons, right lower leg: Secondary | ICD-10-CM | POA: Diagnosis not present

## 2017-01-15 DIAGNOSIS — S76191D Other specified injury of right quadriceps muscle, fascia and tendon, subsequent encounter: Secondary | ICD-10-CM | POA: Diagnosis not present

## 2017-01-22 DIAGNOSIS — S76191D Other specified injury of right quadriceps muscle, fascia and tendon, subsequent encounter: Secondary | ICD-10-CM | POA: Diagnosis not present

## 2017-01-22 DIAGNOSIS — M25661 Stiffness of right knee, not elsewhere classified: Secondary | ICD-10-CM | POA: Diagnosis not present

## 2017-01-22 DIAGNOSIS — M66861 Spontaneous rupture of other tendons, right lower leg: Secondary | ICD-10-CM | POA: Diagnosis not present

## 2017-01-22 DIAGNOSIS — M25561 Pain in right knee: Secondary | ICD-10-CM | POA: Diagnosis not present

## 2017-01-22 NOTE — Progress Notes (Deleted)
Assessment and Plan:  Hypertension -Continue medication, monitor blood pressure at home. Continue DASH diet.  Reminder to go to the ER if any CP, SOB, nausea, dizziness, severe HA, changes vision/speech, left arm numbness and tingling and jaw pain.   Cholesterol -Continue diet and exercise. Check cholesterol.    Prediabetes  -Continue diet and exercise. Check A1C  Vitamin D Def - check level and continue medications.    Hypogonadism-  continue replacement therapy, increase to 3 pumps a day, check testosterone levels as needed.    Actinic keratosis Cream sent in, see orders   Continue diet and meds as discussed. Further disposition pending results of labs. Over 30 minutes of exam, counseling, chart review, and critical decision making was performed  HPI 66 y.o. male  presents for 3 month follow up on hypertension, cholesterol, hypogonadism, and vitamin D deficiency.   His blood pressure has been controlled at home, today their BP is    He does workout. He denies chest pain, shortness of breath, dizziness.  He is on cholesterol medication and denies myalgias. His cholesterol is not at goal. The cholesterol last visit was:   Lab Results  Component Value Date   CHOL 171 10/09/2016   HDL 79 10/09/2016   LDLCALC 68 10/09/2016   TRIG 119 10/09/2016   CHOLHDL 2.2 10/09/2016   Last W0J in the office was:  Lab Results  Component Value Date   HGBA1C 4.8 10/09/2016   Patient is on Vitamin D supplement.   Lab Results  Component Value Date   VD25OH 47 10/09/2016   He has a history of testosterone deficiency and is on testosterone replacement with the gel, two pumps daily a day.  He states that the testosterone helps with his energy, libido, muscle mass but not much of a difference. Lab Results  Component Value Date   TESTOSTERONE 385 10/09/2016     Current Medications:  Current Outpatient Prescriptions on File Prior to Visit  Medication Sig Dispense Refill  . aspirin 81 MG  tablet Take 81 mg by mouth 3 (three) times a week.    Marland Kitchen atenolol (TENORMIN) 100 MG tablet TAKE 1 TABLET BY MOUTH EVERY DAY 90 tablet 1  . Cholecalciferol (VITAMIN D PO) Take by mouth. Takes 4000 units daily    . glucosamine-chondroitin 500-400 MG tablet Take 1 tablet by mouth 2 (two) times daily.    . Multiple Vitamin (MULTIVITAMIN) tablet Take 1 tablet by mouth daily.    . Omega-3 Fatty Acids (FISH OIL) 1000 MG CAPS Take by mouth daily.    . rosuvastatin (CRESTOR) 40 MG tablet TAKE 1 TABLET BY MOUTH EVERY DAY 90 tablet 1  . tamsulosin (FLOMAX) 0.4 MG CAPS capsule TAKE 1 CAPSULE BY MOUTH EVERY DAY 90 capsule 1  . Testosterone (ANDROGEL PUMP) 20.25 MG/ACT (1.62%) GEL apply 2 pumps daily 225 g 1  . vitamin C (ASCORBIC ACID) 500 MG tablet Take 500 mg by mouth daily.     No current facility-administered medications on file prior to visit.    Medical History:  Past Medical History:  Diagnosis Date  . Hyperlipidemia   . Hypertension   . Other testicular hypofunction   . Vitamin D deficiency    Allergies: No Known Allergies   Review of Systems:  Review of Systems  Constitutional: Negative.   HENT: Negative.   Eyes: Negative.   Respiratory: Negative.   Cardiovascular: Negative.   Gastrointestinal: Negative.   Genitourinary: Negative.   Musculoskeletal: Negative.   Skin: Negative.  Neurological: Negative.   Endo/Heme/Allergies: Negative.   Psychiatric/Behavioral: Negative.     Family history- Review and unchanged Social history- Review and unchanged Physical Exam: There were no vitals taken for this visit. Wt Readings from Last 3 Encounters:  10/09/16 209 lb 3.2 oz (94.9 kg)  06/14/16 207 lb (93.9 kg)  03/07/16 207 lb 12.8 oz (94.3 kg)   General Appearance: Well nourished, in no apparent distress. Eyes: PERRLA, EOMs, conjunctiva no swelling or erythema Sinuses: No Frontal/maxillary tenderness ENT/Mouth: Ext aud canals clear, TMs without erythema, bulging. No erythema,  swelling, or exudate on post pharynx.  Tonsils not swollen or erythematous. Hearing normal.  Neck: Supple, thyroid normal.  Respiratory: Respiratory effort normal, BS equal bilaterally without rales, rhonchi, wheezing or stridor.  Cardio: RRR with no MRGs. Brisk peripheral pulses without edema.  Abdomen: Soft, + BS,  Non tender, no guarding, rebound, hernias, masses. Lymphatics: Non tender without lymphadenopathy.  Musculoskeletal: Full ROM, 5/5 strength, Normal gait Skin: Warm, dry without rashes, lesions, ecchymosis.  Neuro: Cranial nerves intact. Normal muscle tone, no cerebellar symptoms. Psych: Awake and oriented X 3, normal affect, Insight and Judgment appropriate.    Quentin MullingAmanda Babe Anthis, PA-C 9:45 AM [facility]

## 2017-01-23 ENCOUNTER — Ambulatory Visit: Payer: Self-pay | Admitting: Physician Assistant

## 2017-01-24 DIAGNOSIS — M25661 Stiffness of right knee, not elsewhere classified: Secondary | ICD-10-CM | POA: Diagnosis not present

## 2017-01-24 DIAGNOSIS — M25561 Pain in right knee: Secondary | ICD-10-CM | POA: Diagnosis not present

## 2017-01-24 DIAGNOSIS — M66861 Spontaneous rupture of other tendons, right lower leg: Secondary | ICD-10-CM | POA: Diagnosis not present

## 2017-01-24 DIAGNOSIS — S76191D Other specified injury of right quadriceps muscle, fascia and tendon, subsequent encounter: Secondary | ICD-10-CM | POA: Diagnosis not present

## 2017-01-28 DIAGNOSIS — M66861 Spontaneous rupture of other tendons, right lower leg: Secondary | ICD-10-CM | POA: Diagnosis not present

## 2017-01-29 DIAGNOSIS — M66861 Spontaneous rupture of other tendons, right lower leg: Secondary | ICD-10-CM | POA: Diagnosis not present

## 2017-01-29 DIAGNOSIS — M25661 Stiffness of right knee, not elsewhere classified: Secondary | ICD-10-CM | POA: Diagnosis not present

## 2017-01-29 DIAGNOSIS — S76191D Other specified injury of right quadriceps muscle, fascia and tendon, subsequent encounter: Secondary | ICD-10-CM | POA: Diagnosis not present

## 2017-01-29 DIAGNOSIS — M25561 Pain in right knee: Secondary | ICD-10-CM | POA: Diagnosis not present

## 2017-01-31 DIAGNOSIS — M25661 Stiffness of right knee, not elsewhere classified: Secondary | ICD-10-CM | POA: Diagnosis not present

## 2017-01-31 DIAGNOSIS — M25561 Pain in right knee: Secondary | ICD-10-CM | POA: Diagnosis not present

## 2017-01-31 DIAGNOSIS — M66861 Spontaneous rupture of other tendons, right lower leg: Secondary | ICD-10-CM | POA: Diagnosis not present

## 2017-01-31 DIAGNOSIS — S76191D Other specified injury of right quadriceps muscle, fascia and tendon, subsequent encounter: Secondary | ICD-10-CM | POA: Diagnosis not present

## 2017-02-05 DIAGNOSIS — S76191D Other specified injury of right quadriceps muscle, fascia and tendon, subsequent encounter: Secondary | ICD-10-CM | POA: Diagnosis not present

## 2017-02-05 DIAGNOSIS — M25561 Pain in right knee: Secondary | ICD-10-CM | POA: Diagnosis not present

## 2017-02-05 DIAGNOSIS — M25661 Stiffness of right knee, not elsewhere classified: Secondary | ICD-10-CM | POA: Diagnosis not present

## 2017-02-05 DIAGNOSIS — M66861 Spontaneous rupture of other tendons, right lower leg: Secondary | ICD-10-CM | POA: Diagnosis not present

## 2017-02-07 DIAGNOSIS — S76191D Other specified injury of right quadriceps muscle, fascia and tendon, subsequent encounter: Secondary | ICD-10-CM | POA: Diagnosis not present

## 2017-02-07 DIAGNOSIS — M66861 Spontaneous rupture of other tendons, right lower leg: Secondary | ICD-10-CM | POA: Diagnosis not present

## 2017-02-07 DIAGNOSIS — M25561 Pain in right knee: Secondary | ICD-10-CM | POA: Diagnosis not present

## 2017-02-07 DIAGNOSIS — M25661 Stiffness of right knee, not elsewhere classified: Secondary | ICD-10-CM | POA: Diagnosis not present

## 2017-02-09 ENCOUNTER — Other Ambulatory Visit: Payer: Self-pay | Admitting: Internal Medicine

## 2017-02-12 DIAGNOSIS — M25561 Pain in right knee: Secondary | ICD-10-CM | POA: Diagnosis not present

## 2017-02-12 DIAGNOSIS — M25661 Stiffness of right knee, not elsewhere classified: Secondary | ICD-10-CM | POA: Diagnosis not present

## 2017-02-12 DIAGNOSIS — M66861 Spontaneous rupture of other tendons, right lower leg: Secondary | ICD-10-CM | POA: Diagnosis not present

## 2017-02-12 DIAGNOSIS — S76191D Other specified injury of right quadriceps muscle, fascia and tendon, subsequent encounter: Secondary | ICD-10-CM | POA: Diagnosis not present

## 2017-02-14 DIAGNOSIS — M25561 Pain in right knee: Secondary | ICD-10-CM | POA: Diagnosis not present

## 2017-02-14 DIAGNOSIS — M66861 Spontaneous rupture of other tendons, right lower leg: Secondary | ICD-10-CM | POA: Diagnosis not present

## 2017-02-14 DIAGNOSIS — S76191D Other specified injury of right quadriceps muscle, fascia and tendon, subsequent encounter: Secondary | ICD-10-CM | POA: Diagnosis not present

## 2017-02-14 DIAGNOSIS — M25661 Stiffness of right knee, not elsewhere classified: Secondary | ICD-10-CM | POA: Diagnosis not present

## 2017-02-19 DIAGNOSIS — M25661 Stiffness of right knee, not elsewhere classified: Secondary | ICD-10-CM | POA: Diagnosis not present

## 2017-02-19 DIAGNOSIS — S76191D Other specified injury of right quadriceps muscle, fascia and tendon, subsequent encounter: Secondary | ICD-10-CM | POA: Diagnosis not present

## 2017-02-19 DIAGNOSIS — M66861 Spontaneous rupture of other tendons, right lower leg: Secondary | ICD-10-CM | POA: Diagnosis not present

## 2017-02-19 DIAGNOSIS — M25561 Pain in right knee: Secondary | ICD-10-CM | POA: Diagnosis not present

## 2017-02-21 DIAGNOSIS — M25661 Stiffness of right knee, not elsewhere classified: Secondary | ICD-10-CM | POA: Diagnosis not present

## 2017-02-21 DIAGNOSIS — S76191D Other specified injury of right quadriceps muscle, fascia and tendon, subsequent encounter: Secondary | ICD-10-CM | POA: Diagnosis not present

## 2017-02-21 DIAGNOSIS — M66861 Spontaneous rupture of other tendons, right lower leg: Secondary | ICD-10-CM | POA: Diagnosis not present

## 2017-02-21 DIAGNOSIS — M25561 Pain in right knee: Secondary | ICD-10-CM | POA: Diagnosis not present

## 2017-02-25 DIAGNOSIS — S76191D Other specified injury of right quadriceps muscle, fascia and tendon, subsequent encounter: Secondary | ICD-10-CM | POA: Diagnosis not present

## 2017-02-25 DIAGNOSIS — S86812A Strain of other muscle(s) and tendon(s) at lower leg level, left leg, initial encounter: Secondary | ICD-10-CM | POA: Diagnosis not present

## 2017-02-26 DIAGNOSIS — M25661 Stiffness of right knee, not elsewhere classified: Secondary | ICD-10-CM | POA: Diagnosis not present

## 2017-02-26 DIAGNOSIS — M25561 Pain in right knee: Secondary | ICD-10-CM | POA: Diagnosis not present

## 2017-02-26 DIAGNOSIS — M66861 Spontaneous rupture of other tendons, right lower leg: Secondary | ICD-10-CM | POA: Diagnosis not present

## 2017-02-26 DIAGNOSIS — S76191D Other specified injury of right quadriceps muscle, fascia and tendon, subsequent encounter: Secondary | ICD-10-CM | POA: Diagnosis not present

## 2017-02-28 DIAGNOSIS — S76191D Other specified injury of right quadriceps muscle, fascia and tendon, subsequent encounter: Secondary | ICD-10-CM | POA: Diagnosis not present

## 2017-02-28 DIAGNOSIS — M25561 Pain in right knee: Secondary | ICD-10-CM | POA: Diagnosis not present

## 2017-02-28 DIAGNOSIS — M25661 Stiffness of right knee, not elsewhere classified: Secondary | ICD-10-CM | POA: Diagnosis not present

## 2017-02-28 DIAGNOSIS — M66861 Spontaneous rupture of other tendons, right lower leg: Secondary | ICD-10-CM | POA: Diagnosis not present

## 2017-03-05 DIAGNOSIS — M66861 Spontaneous rupture of other tendons, right lower leg: Secondary | ICD-10-CM | POA: Diagnosis not present

## 2017-03-05 DIAGNOSIS — M25661 Stiffness of right knee, not elsewhere classified: Secondary | ICD-10-CM | POA: Diagnosis not present

## 2017-03-05 DIAGNOSIS — M25561 Pain in right knee: Secondary | ICD-10-CM | POA: Diagnosis not present

## 2017-03-05 DIAGNOSIS — S76191D Other specified injury of right quadriceps muscle, fascia and tendon, subsequent encounter: Secondary | ICD-10-CM | POA: Diagnosis not present

## 2017-03-07 DIAGNOSIS — M66861 Spontaneous rupture of other tendons, right lower leg: Secondary | ICD-10-CM | POA: Diagnosis not present

## 2017-03-07 DIAGNOSIS — M25661 Stiffness of right knee, not elsewhere classified: Secondary | ICD-10-CM | POA: Diagnosis not present

## 2017-03-07 DIAGNOSIS — S76191D Other specified injury of right quadriceps muscle, fascia and tendon, subsequent encounter: Secondary | ICD-10-CM | POA: Diagnosis not present

## 2017-03-07 DIAGNOSIS — M25561 Pain in right knee: Secondary | ICD-10-CM | POA: Diagnosis not present

## 2017-03-11 NOTE — Progress Notes (Signed)
Assessment and Plan:   Hypertension -Continue medication, monitor blood pressure at home. Continue DASH diet.  Reminder to go to the ER if any CP, SOB, nausea, dizziness, severe HA, changes vision/speech, left arm numbness and tingling and jaw pain.  Cholesterol -Continue diet and exercise. Check cholesterol.    Prediabetes  -continue diet and exercise. Check A1C   Vitamin D Def - check level and continue medications.    Hypogonadism-  continue replacement therapy, increase to 3 pumps a day, check testosterone levels as needed.   Future Appointments Date Time Provider Department Center  04/29/2017 3:30 PM Lucky CowboyMcKeown, William, MD [facility] None  11/06/2017 3:00 PM Lucky CowboyMcKeown, William, MD [facility] None    Continue diet and meds as discussed. Further disposition pending results of labs. Over 30 minutes of exam, counseling, chart review, and critical decision making was performed  HPI 66 y.o. male  presents for 3 month follow up on hypertension, cholesterol, hypogonadism, and vitamin D deficiency.   His blood pressure has been controlled at home, today their BP is BP: 130/74  He does workout. He denies chest pain, shortness of breath, dizziness. Had patella tendon repair after fall in May, doing PT at this time.   He is on cholesterol medication, crestor 40mg  1/4 every day and denies myalgias. His cholesterol is not at goal. The cholesterol last visit was:   Lab Results  Component Value Date   CHOL 171 10/09/2016   HDL 79 10/09/2016   LDLCALC 68 10/09/2016   TRIG 119 10/09/2016   CHOLHDL 2.2 10/09/2016  Last A1C in the office was:  Lab Results  Component Value Date   HGBA1C 4.8 10/09/2016  Patient is on Vitamin D supplement.   Lab Results  Component Value Date   VD25OH 47 10/09/2016    He has a history of testosterone deficiency and is on testosterone replacement with the gel, four pumps daily a day.  He states that the testosterone helps with his energy, libido, muscle mass but  not much of a difference. Lab Results  Component Value Date   TESTOSTERONE 385 10/09/2016   BMI is Body mass index is 26.25 kg/m., he is working on diet and exercise. Wt Readings from Last 3 Encounters:  03/12/17 199 lb (90.3 kg)  10/09/16 209 lb 3.2 oz (94.9 kg)  06/14/16 207 lb (93.9 kg)     Current Medications:  Current Outpatient Prescriptions on File Prior to Visit  Medication Sig Dispense Refill  . aspirin 81 MG tablet Take 81 mg by mouth 3 (three) times a week.    Marland Kitchen. atenolol (TENORMIN) 100 MG tablet TAKE 1 TABLET BY MOUTH EVERY DAY 90 tablet 1  . Cholecalciferol (VITAMIN D PO) Take by mouth. Takes 4000 units daily    . glucosamine-chondroitin 500-400 MG tablet Take 1 tablet by mouth 2 (two) times daily.    . Multiple Vitamin (MULTIVITAMIN) tablet Take 1 tablet by mouth daily.    . Omega-3 Fatty Acids (FISH OIL) 1000 MG CAPS Take by mouth daily.    . rosuvastatin (CRESTOR) 40 MG tablet TAKE 1 TABLET BY MOUTH EVERY DAY 90 tablet 1  . Testosterone (ANDROGEL PUMP) 20.25 MG/ACT (1.62%) GEL apply 2 pumps daily 225 g 1  . vitamin C (ASCORBIC ACID) 500 MG tablet Take 500 mg by mouth daily.     No current facility-administered medications on file prior to visit.    Medical History:  Past Medical History:  Diagnosis Date  . Hyperlipidemia   . Hypertension   .  Other testicular hypofunction   . Vitamin D deficiency    Allergies: No Known Allergies   Review of Systems:  Review of Systems  Constitutional: Negative.   HENT: Negative.   Eyes: Negative.   Respiratory: Negative.   Cardiovascular: Negative.   Gastrointestinal: Negative.   Genitourinary: Negative.   Musculoskeletal: Negative.   Skin: Negative.   Neurological: Negative.   Endo/Heme/Allergies: Negative.   Psychiatric/Behavioral: Negative.     Family history- Review and unchanged Social history- Review and unchanged Physical Exam: BP 130/74   Pulse 62   Temp 97.7 F (36.5 C)   Resp 14   Ht 6\' 1"  (1.854  m)   Wt 199 lb (90.3 kg)   SpO2 97%   BMI 26.25 kg/m  Wt Readings from Last 3 Encounters:  03/12/17 199 lb (90.3 kg)  10/09/16 209 lb 3.2 oz (94.9 kg)  06/14/16 207 lb (93.9 kg)   General Appearance: Well nourished, in no apparent distress. Eyes: PERRLA, EOMs, conjunctiva no swelling or erythema Sinuses: No Frontal/maxillary tenderness ENT/Mouth: Ext aud canals clear, TMs without erythema, bulging. No erythema, swelling, or exudate on post pharynx.  Tonsils not swollen or erythematous. Hearing normal.  Neck: Supple, thyroid normal.  Respiratory: Respiratory effort normal, BS equal bilaterally without rales, rhonchi, wheezing or stridor.  Cardio: RRR with no MRGs. Brisk peripheral pulses without edema.  Abdomen: Soft, + BS,  Non tender, no guarding, rebound, hernias, masses. Lymphatics: Non tender without lymphadenopathy.  Musculoskeletal: Full ROM, 5/5 strength, Normal gait Skin: Warm, dry without rashes, lesions, ecchymosis.  Neuro: Cranial nerves intact. Normal muscle tone, no cerebellar symptoms. Psych: Awake and oriented X 3, normal affect, Insight and Judgment appropriate.    Quentin Mulling, PA-C 4:36 PM [facility]

## 2017-03-12 ENCOUNTER — Encounter: Payer: Self-pay | Admitting: Physician Assistant

## 2017-03-12 ENCOUNTER — Ambulatory Visit (INDEPENDENT_AMBULATORY_CARE_PROVIDER_SITE_OTHER): Payer: Medicare Other | Admitting: Physician Assistant

## 2017-03-12 VITALS — BP 130/74 | HR 62 | Temp 97.7°F | Resp 14 | Ht 73.0 in | Wt 199.0 lb

## 2017-03-12 DIAGNOSIS — E559 Vitamin D deficiency, unspecified: Secondary | ICD-10-CM | POA: Diagnosis not present

## 2017-03-12 DIAGNOSIS — Z23 Encounter for immunization: Secondary | ICD-10-CM | POA: Diagnosis not present

## 2017-03-12 DIAGNOSIS — Z79899 Other long term (current) drug therapy: Secondary | ICD-10-CM

## 2017-03-12 DIAGNOSIS — E782 Mixed hyperlipidemia: Secondary | ICD-10-CM

## 2017-03-12 DIAGNOSIS — I10 Essential (primary) hypertension: Secondary | ICD-10-CM

## 2017-03-12 DIAGNOSIS — R7303 Prediabetes: Secondary | ICD-10-CM | POA: Diagnosis not present

## 2017-03-12 LAB — LIPID PANEL
Cholesterol: 159 mg/dL (ref <200)
HDL: 83 mg/dL (ref >40)
LDL Cholesterol (Calc): 59 mg/dL (calc)
NON-HDL CHOLESTEROL (CALC): 76 mg/dL (ref <130)
TRIGLYCERIDES: 87 mg/dL (ref <150)
Total CHOL/HDL Ratio: 1.9 (calc) (ref <5.0)

## 2017-03-12 LAB — BASIC METABOLIC PANEL WITH GFR
BUN: 12 mg/dL (ref 7–25)
CALCIUM: 9.5 mg/dL (ref 8.6–10.3)
CHLORIDE: 105 mmol/L (ref 98–110)
CO2: 26 mmol/L (ref 20–32)
Creat: 0.98 mg/dL (ref 0.70–1.25)
GFR, EST NON AFRICAN AMERICAN: 81 mL/min/{1.73_m2} (ref >=60)
GFR, Est African American: 93 mL/min/{1.73_m2} (ref >=60)
GLUCOSE: 83 mg/dL (ref 65–99)
POTASSIUM: 4.2 mmol/L (ref 3.5–5.3)
SODIUM: 140 mmol/L (ref 135–146)

## 2017-03-12 LAB — CBC WITH DIFFERENTIAL/PLATELET
BASOS ABS: 19 {cells}/uL (ref 0–200)
Basophils Relative: 0.3 %
Eosinophils Absolute: 143 cells/uL (ref 15–500)
Eosinophils Relative: 2.3 %
HCT: 44.2 % (ref 38.5–50.0)
Hemoglobin: 15.5 g/dL (ref 13.2–17.1)
Lymphs Abs: 3627 cells/uL (ref 850–3900)
MCH: 30.8 pg (ref 27.0–33.0)
MCHC: 35.1 g/dL (ref 32.0–36.0)
MCV: 87.7 fL (ref 80.0–100.0)
MPV: 9.5 fL (ref 7.5–12.5)
Monocytes Relative: 9.2 %
Neutro Abs: 1841 cells/uL (ref 1500–7800)
Neutrophils Relative %: 29.7 %
PLATELETS: 253 10*3/uL (ref 140–400)
RBC: 5.04 10*6/uL (ref 4.20–5.80)
RDW: 13.7 % (ref 11.0–15.0)
TOTAL LYMPHOCYTE: 58.5 %
WBC: 6.2 10*3/uL (ref 3.8–10.8)
WBCMIX: 570 {cells}/uL (ref 200–950)

## 2017-03-12 LAB — HEPATIC FUNCTION PANEL
AG Ratio: 2 (calc) (ref 1.0–2.5)
ALBUMIN MSPROF: 4.5 g/dL (ref 3.6–5.1)
ALKALINE PHOSPHATASE (APISO): 55 U/L (ref 40–115)
ALT: 19 U/L (ref 9–46)
AST: 19 U/L (ref 10–35)
BILIRUBIN TOTAL: 0.5 mg/dL (ref 0.2–1.2)
Bilirubin, Direct: 0.1 mg/dL (ref 0.0–0.2)
Globulin: 2.2 g/dL (calc) (ref 1.9–3.7)
Indirect Bilirubin: 0.4 mg/dL (calc) (ref 0.2–1.2)
Total Protein: 6.7 g/dL (ref 6.1–8.1)

## 2017-03-12 LAB — TSH: TSH: 2.01 mIU/L (ref 0.40–4.50)

## 2017-03-12 MED ORDER — ROSUVASTATIN CALCIUM 10 MG PO TABS: 10.0000 mg | ORAL_TABLET | Freq: Every day | ORAL | 3 refills | Status: DC

## 2017-03-12 MED ORDER — ATENOLOL 50 MG PO TABS: 50.0000 mg | ORAL_TABLET | Freq: Every day | ORAL | 90 refills | Status: DC

## 2017-03-12 NOTE — Patient Instructions (Signed)

## 2017-03-13 DIAGNOSIS — M66861 Spontaneous rupture of other tendons, right lower leg: Secondary | ICD-10-CM | POA: Diagnosis not present

## 2017-03-13 DIAGNOSIS — S76191D Other specified injury of right quadriceps muscle, fascia and tendon, subsequent encounter: Secondary | ICD-10-CM | POA: Diagnosis not present

## 2017-03-13 DIAGNOSIS — M25661 Stiffness of right knee, not elsewhere classified: Secondary | ICD-10-CM | POA: Diagnosis not present

## 2017-03-13 DIAGNOSIS — M25561 Pain in right knee: Secondary | ICD-10-CM | POA: Diagnosis not present

## 2017-03-13 NOTE — Progress Notes (Signed)
LVM FOR RETURN CALL 

## 2017-03-21 NOTE — Progress Notes (Signed)
LVM for pt to return office call for LAB results.

## 2017-04-17 DIAGNOSIS — M25561 Pain in right knee: Secondary | ICD-10-CM | POA: Diagnosis not present

## 2017-04-29 ENCOUNTER — Ambulatory Visit: Payer: Self-pay | Admitting: Internal Medicine

## 2017-07-09 NOTE — Progress Notes (Signed)
This very nice 67 y.o. MWM presents for 3 month follow up with Hypertension, Hyperlipidemia, Pre-Diabetes and Vitamin D Deficiency.      Patient is treated for HTN (2006)  & BP has been controlled at home. Today's BP is at goal - 124/74. Patient has had no complaints of any cardiac type chest pain, palpitations, dyspnea / orthopnea / PND, dizziness, claudication, or dependent edema.     Hyperlipidemia is controlled with diet & meds. Patient denies myalgias or other med SE's. Last Lipids were at goal:  Lab Results  Component Value Date   CHOL 159 03/12/2017   HDL 83 03/12/2017   LDLCALC 68 10/09/2016   TRIG 87 03/12/2017   CHOLHDL 1.9 03/12/2017      Also, the patient is monitored expectantly for PreDiabetes and has had no symptoms of reactive hypoglycemia, diabetic polys, paresthesias or visual blurring.  Last A1c was Normal at goal: Lab Results  Component Value Date   HGBA1C 4.8 10/09/2016      Patient has hx/o Testosterone deficiency (level "228.87" in 2013) and has been on replacement with Androgel with improved stamina & sense of well being.      Further, the patient also has history of Vitamin D Deficiency ("28"/2008)  and supplements vitamin D ("28" / 2008) without any suspected side-effects. Last vitamin D was  Sl low  (goal 70-100): Lab Results  Component Value Date   VD25OH 47 10/09/2016   Current Outpatient Medications on File Prior to Visit  Medication Sig  . aspirin 81 MG tablet Take 81 mg by mouth 3 (three) times a week.  Marland Kitchen. atenolol (TENORMIN) 50 MG tablet Take 1 tablet (50 mg total) by mouth daily.  . Cholecalciferol (VITAMIN D PO) Take by mouth. Takes 4000 units daily  . glucosamine-chondroitin 500-400 MG tablet Take 1 tablet by mouth 2 (two) times daily.  . Multiple Vitamin (MULTIVITAMIN) tablet Take 1 tablet by mouth daily.  . Omega-3 Fatty Acids (FISH OIL) 1000 MG CAPS Take by mouth daily.  . rosuvastatin (CRESTOR) 10 MG tablet Take 1 tablet (10 mg total) by  mouth daily.  . Testosterone (ANDROGEL PUMP) 20.25 MG/ACT (1.62%) GEL apply 2 pumps daily  . vitamin C (ASCORBIC ACID) 500 MG tablet Take 500 mg by mouth daily.   No current facility-administered medications on file prior to visit.    No Known Allergies PMHx:   Past Medical History:  Diagnosis Date  . Hyperlipidemia   . Hypertension   . Other testicular hypofunction   . Vitamin D deficiency    Immunization History  Administered Date(s) Administered  . Influenza, High Dose Seasonal PF 03/12/2017  . Influenza, Seasonal, Injecte, Preservative Fre 06/21/2015  . Influenza-Unspecified 07/01/2014  . PPD Test 08/13/2014, 08/30/2015  . Pneumococcal Conjugate-13 08/13/2014  . Pneumococcal Polysaccharide-23 06/14/2016  . Pneumococcal-Unspecified 07/09/1997  . Td 07/09/2004  . Tdap 08/13/2014  . Zoster 06/29/2014   Past Surgical History:  Procedure Laterality Date  . ANKLE SURGERY Left 1983   for bone spur  . APPENDECTOMY  1960  . KNEE ARTHROSCOPY Left 1986, 1987  . PATELLAR TENDON REPAIR Right 11/2016   [facility]  . TONSILLECTOMY  1958   FHx:    Reviewed / unchanged  SHx:    Reviewed / unchanged   Systems Review:  Constitutional: Denies fever, chills, wt changes, headaches, insomnia, fatigue, night sweats, change in appetite. Eyes: Denies redness, blurred vision, diplopia, discharge, itchy, watery eyes.  ENT: Denies discharge, congestion, post nasal  drip, epistaxis, sore throat, earache, hearing loss, dental pain, tinnitus, vertigo, sinus pain, snoring.  CV: Denies chest pain, palpitations, irregular heartbeat, syncope, dyspnea, diaphoresis, orthopnea, PND, claudication or edema. Respiratory: denies cough, dyspnea, DOE, pleurisy, hoarseness, laryngitis, wheezing.  Gastrointestinal: Denies dysphagia, odynophagia, heartburn, reflux, water brash, abdominal pain or cramps, nausea, vomiting, bloating, diarrhea, constipation, hematemesis, melena, hematochezia  or  hemorrhoids. Genitourinary: Denies dysuria, frequency, urgency, nocturia, hesitancy, discharge, hematuria or flank pain. Musculoskeletal: Denies arthralgias, myalgias, stiffness, jt. swelling, pain, limping or strain/sprain.  Skin: Denies pruritus, rash, hives, warts, acne, eczema or change in skin lesion(s). Neuro: No weakness, tremor, incoordination, spasms, paresthesia or pain. Psychiatric: Denies confusion, memory loss or sensory loss. Endo: Denies change in weight, skin or hair change.  Heme/Lymph: No excessive bleeding, bruising or enlarged lymph nodes.  Physical Exam  BP 124/74   Pulse 60   Temp (!) 97.3 F (36.3 C)   Resp 16   Ht 6\' 1"  (1.854 m)   Wt 205 lb 9.6 oz (93.3 kg)   BMI 27.13 kg/m   Appears well nourished, well groomed  and in no distress.  Eyes: PERRLA, EOMs, conjunctiva no swelling or erythema. Sinuses: No frontal/maxillary tenderness ENT/Mouth: EAC's clear, TM's nl w/o erythema, bulging. Nares clear w/o erythema, swelling, exudates. Oropharynx clear without erythema or exudates. Oral hygiene is good. Tongue normal, non obstructing. Hearing intact.  Neck: Supple. Thyroid nl. Car 2+/2+ without bruits, nodes or JVD. Chest: Respirations nl with BS clear & equal w/o rales, rhonchi, wheezing or stridor.  Cor: Heart sounds normal w/ regular rate and rhythm without sig. murmurs, gallops, clicks or rubs. Peripheral pulses normal and equal  without edema.  Abdomen: Soft & bowel sounds normal. Non-tender w/o guarding, rebound, hernias, masses or organomegaly.  Lymphatics: Unremarkable.  Musculoskeletal: Full ROM all peripheral extremities, joint stability, 5/5 strength and normal gait.  Skin: Warm, dry without exposed rashes, lesions or ecchymosis apparent.  Neuro: Cranial nerves intact, reflexes equal bilaterally. Sensory-motor testing grossly intact. Tendon reflexes grossly intact.  Pysch: Alert & oriented x 3.  Insight and judgement nl & appropriate. No  ideations.  Assessment and Plan:  1. Essential hypertension  - Continue medication, monitor blood pressure at home.  - Continue DASH diet. Reminder to go to the ER if any CP,  SOB, nausea, dizziness, severe HA, changes vision/speech. - CBC with Differential/Platelet - BASIC METABOLIC PANEL WITH GFR - Magnesium - TSH  2. Mixed hyperlipidemia  - Continue diet/meds, exercise,& lifestyle modifications.  - Continue monitor periodic cholesterol/liver & renal functions  - Hepatic function panel - Lipid panel - TSH  3. Prediabetes  - Continue diet, exercise, lifestyle modifications.  - Monitor appropriate labs.  - Hemoglobin A1c - Insulin, random  4. Vitamin D deficiency  - Continue supplementation.  - VITAMIN D 25 Hydroxy   5. Abnormal glucose  - Hemoglobin A1c - Insulin, random  6. Testosterone deficiency  - Testosterone  7. Medication management  - Testosterone        Discussed  regular exercise, BP monitoring, weight control to achieve/maintain BMI less than 25 and discussed med and SE's. Recommended labs to assess and monitor clinical status with further disposition pending results of labs. Over 30 minutes of exam, counseling, chart review was performed.

## 2017-07-10 ENCOUNTER — Ambulatory Visit (INDEPENDENT_AMBULATORY_CARE_PROVIDER_SITE_OTHER): Payer: Medicare Other | Admitting: Internal Medicine

## 2017-07-10 VITALS — BP 124/74 | HR 60 | Temp 97.3°F | Resp 16 | Ht 73.0 in | Wt 205.6 lb

## 2017-07-10 DIAGNOSIS — E349 Endocrine disorder, unspecified: Secondary | ICD-10-CM

## 2017-07-10 DIAGNOSIS — E559 Vitamin D deficiency, unspecified: Secondary | ICD-10-CM

## 2017-07-10 DIAGNOSIS — I10 Essential (primary) hypertension: Secondary | ICD-10-CM

## 2017-07-10 DIAGNOSIS — R7309 Other abnormal glucose: Secondary | ICD-10-CM | POA: Diagnosis not present

## 2017-07-10 DIAGNOSIS — R7303 Prediabetes: Secondary | ICD-10-CM

## 2017-07-10 DIAGNOSIS — Z79899 Other long term (current) drug therapy: Secondary | ICD-10-CM

## 2017-07-10 DIAGNOSIS — E782 Mixed hyperlipidemia: Secondary | ICD-10-CM | POA: Diagnosis not present

## 2017-07-10 NOTE — Patient Instructions (Signed)

## 2017-07-11 ENCOUNTER — Encounter: Payer: Self-pay | Admitting: Internal Medicine

## 2017-07-11 LAB — CBC WITH DIFFERENTIAL/PLATELET
BASOS PCT: 0.5 %
Basophils Absolute: 30 cells/uL (ref 0–200)
Eosinophils Absolute: 108 cells/uL (ref 15–500)
Eosinophils Relative: 1.8 %
HCT: 44.8 % (ref 38.5–50.0)
Hemoglobin: 15.7 g/dL (ref 13.2–17.1)
Lymphs Abs: 2778 cells/uL (ref 850–3900)
MCH: 31.5 pg (ref 27.0–33.0)
MCHC: 35 g/dL (ref 32.0–36.0)
MCV: 89.8 fL (ref 80.0–100.0)
MPV: 9.7 fL (ref 7.5–12.5)
Monocytes Relative: 8.2 %
Neutro Abs: 2592 cells/uL (ref 1500–7800)
Neutrophils Relative %: 43.2 %
PLATELETS: 266 10*3/uL (ref 140–400)
RBC: 4.99 10*6/uL (ref 4.20–5.80)
RDW: 12.6 % (ref 11.0–15.0)
TOTAL LYMPHOCYTE: 46.3 %
WBC mixed population: 492 cells/uL (ref 200–950)
WBC: 6 10*3/uL (ref 3.8–10.8)

## 2017-07-11 LAB — HEPATIC FUNCTION PANEL
AG RATIO: 1.8 (calc) (ref 1.0–2.5)
ALKALINE PHOSPHATASE (APISO): 52 U/L (ref 40–115)
ALT: 29 U/L (ref 9–46)
AST: 25 U/L (ref 10–35)
Albumin: 4.3 g/dL (ref 3.6–5.1)
BILIRUBIN TOTAL: 0.5 mg/dL (ref 0.2–1.2)
Bilirubin, Direct: 0.1 mg/dL (ref 0.0–0.2)
Globulin: 2.4 g/dL (calc) (ref 1.9–3.7)
Indirect Bilirubin: 0.4 mg/dL (calc) (ref 0.2–1.2)
Total Protein: 6.7 g/dL (ref 6.1–8.1)

## 2017-07-11 LAB — LIPID PANEL
CHOL/HDL RATIO: 2.4 (calc) (ref <5.0)
Cholesterol: 199 mg/dL (ref <200)
HDL: 83 mg/dL (ref >40)
LDL Cholesterol (Calc): 95 mg/dL (calc)
NON-HDL CHOLESTEROL (CALC): 116 mg/dL (ref <130)
Triglycerides: 118 mg/dL (ref <150)

## 2017-07-11 LAB — BASIC METABOLIC PANEL WITH GFR
BUN: 12 mg/dL (ref 7–25)
CO2: 27 mmol/L (ref 20–32)
CREATININE: 0.95 mg/dL (ref 0.70–1.25)
Calcium: 9.2 mg/dL (ref 8.6–10.3)
Chloride: 105 mmol/L (ref 98–110)
GFR, EST NON AFRICAN AMERICAN: 83 mL/min/{1.73_m2} (ref >=60)
GFR, Est African American: 96 mL/min/{1.73_m2} (ref >=60)
Glucose, Bld: 86 mg/dL (ref 65–99)
Potassium: 4.1 mmol/L (ref 3.5–5.3)
SODIUM: 140 mmol/L (ref 135–146)

## 2017-07-11 LAB — MAGNESIUM: Magnesium: 2.4 mg/dL (ref 1.5–2.5)

## 2017-07-11 LAB — INSULIN, RANDOM: Insulin: 8.7 u[IU]/mL (ref 2.0–19.6)

## 2017-07-11 LAB — TSH: TSH: 2.12 mIU/L (ref 0.40–4.50)

## 2017-07-11 LAB — HEMOGLOBIN A1C
Hgb A1c MFr Bld: 5.1 % of total Hgb (ref <5.7)
Mean Plasma Glucose: 100 (calc)
eAG (mmol/L): 5.5 (calc)

## 2017-07-11 LAB — VITAMIN D 25 HYDROXY (VIT D DEFICIENCY, FRACTURES): VIT D 25 HYDROXY: 57 ng/mL (ref 30–100)

## 2017-07-11 LAB — TESTOSTERONE: TESTOSTERONE: 244 ng/dL — AB (ref 250–827)

## 2017-07-18 ENCOUNTER — Other Ambulatory Visit: Payer: Self-pay | Admitting: Internal Medicine

## 2017-07-18 ENCOUNTER — Telehealth: Payer: Self-pay | Admitting: *Deleted

## 2017-07-18 MED ORDER — IPRATROPIUM BROMIDE 0.06 % NA SOLN: NASAL | 0 refills | Status: DC

## 2017-07-18 MED ORDER — AZITHROMYCIN 250 MG PO TABS: ORAL_TABLET | ORAL | 1 refills | Status: DC

## 2017-07-18 MED ORDER — PREDNISONE 20 MG PO TABS: ORAL_TABLET | ORAL | 0 refills | Status: DC

## 2017-07-18 NOTE — Telephone Encounter (Signed)
Patient was informed RX's have been sent to [facility] for his cold/flu symptoms, per Dr Oneta RackMcKeown.

## 2017-08-10 ENCOUNTER — Other Ambulatory Visit: Payer: Self-pay | Admitting: Internal Medicine

## 2017-10-21 NOTE — Progress Notes (Deleted)
MEDICARE ANNUAL WELLNESS VISIT AND FOLLOW UP Assessment:   Diagnoses and all orders for this visit:  Encounter for Medicare annual wellness exam  Essential hypertension Continue medications Monitor blood pressure at home; call if consistently over 130/80 Continue DASH diet.   Reminder to go to the ER if any CP, SOB, nausea, dizziness, severe HA, changes vision/speech, left arm numbness and tingling and jaw pain.  Vitamin D deficiency ***  Testosterone deficiency Continue androgel; check testosterone levels as needed  Other abnormal glucose Recent A1Cs at goal Discussed diet/exercise, weight management  Defer A1C; check BMP  Medication management CBC, BMP/GFR, LFTs  Mixed hyperlipidemia Continue medications Continue low cholesterol diet and exercise.  Check lipid panel.   BMI 27.0-27.9,adult Continue to recommend diet heavy in fruits and veggies and low in animal meats, cheeses, and dairy products, appropriate calorie intake Discuss exercise recommendations routinely Continue to monitor weight at each visit   Over 30 minutes of exam, counseling, chart review, and critical decision making was performed  Future Appointments  Date Time Provider Department Center  10/22/2017  4:15 PM Judd Gaudierorbett, Aliah Eriksson, NP [facility] None  02/24/2018  3:45 PM Lucky CowboyMcKeown, William, MD [facility] None     Plan:   During the course of the visit the patient was educated and counseled about appropriate screening and preventive services including:    Pneumococcal vaccine   Influenza vaccine  Prevnar 13  Td vaccine  Screening electrocardiogram  Colorectal cancer screening  Diabetes screening  Glaucoma screening  Nutrition counseling    Subjective:  Brett Perez is a 67 y.o. male who presents for Medicare Annual Wellness Visit and 3 month follow up for HTN, hyperlipidemia, glucose management, and vitamin D Def.   BMI is There is no height or weight on file to calculate BMI., he  {HAS HAS ZOX:09604}OT:18834} been working on diet and exercise. Wt Readings from Last 3 Encounters:  07/10/17 205 lb 9.6 oz (93.3 kg)  03/12/17 199 lb (90.3 kg)  10/09/16 209 lb 3.2 oz (94.9 kg)    His blood pressure {HAS HAS NOT:18834} been controlled at home, today their BP is   He {DOES_DOES VWU:98119}OT:18564} workout. He denies chest pain, shortness of breath, dizziness.   He is on cholesterol medication (crestor 10 mg ***) and denies myalgias. His cholesterol is at goal. The cholesterol last visit was:   Lab Results  Component Value Date   CHOL 199 07/10/2017   HDL 83 07/10/2017   LDLCALC 95 07/10/2017   TRIG 118 07/10/2017   CHOLHDL 2.4 07/10/2017   He {Has/has not:18111} been working on diet and exercise for glucose management, and denies {Symptoms; diabetes w/o none:19199}. Last A1C in the office was:  Lab Results  Component Value Date   HGBA1C 5.1 07/10/2017   Last GFR Lab Results  Component Value Date   GFRNONAA 83 07/10/2017    Patient is on Vitamin D supplement and approaching goal of 70:    Lab Results  Component Value Date   VD25OH 57 07/10/2017      Medication Review:  Current Outpatient Medications (Endocrine & Metabolic):  Marland Kitchen.  ANDROGEL PUMP 20.25 MG/ACT (1.62%) GEL, APPLY 2 PUMPS DAILY (1 PUMP TO EACH ARM)  Current Outpatient Medications (Cardiovascular):  .  atenolol (TENORMIN) 50 MG tablet, Take 1 tablet (50 mg total) by mouth daily. .  rosuvastatin (CRESTOR) 10 MG tablet, Take 1 tablet (10 mg total) by mouth daily.  Current Outpatient Medications (Respiratory):  .  ipratropium (ATROVENT) 0.06 % nasal spray,  Use 1 to 2 sprays each nostril 2 to 3 x / day  Current Outpatient Medications (Analgesics):  .  aspirin 81 MG tablet, Take 81 mg by mouth 3 (three) times a week.   Current Outpatient Medications (Other):  Marland Kitchen  Cholecalciferol (VITAMIN D PO), Take by mouth. Takes 4000 units daily .  glucosamine-chondroitin 500-400 MG tablet, Take 1 tablet by mouth 2 (two) times  daily. .  Multiple Vitamin (MULTIVITAMIN) tablet, Take 1 tablet by mouth daily. .  Omega-3 Fatty Acids (FISH OIL) 1000 MG CAPS, Take by mouth daily. .  vitamin C (ASCORBIC ACID) 500 MG tablet, Take 500 mg by mouth daily.  Allergies: No Known Allergies  Current Problems (verified) has Hyperlipidemia; Hypertension; Vitamin D deficiency; Testosterone deficiency; Other abnormal glucose; Medication management; and Encounter for Medicare annual wellness exam on their problem list.  Screening Tests Immunization History  Administered Date(s) Administered  . Influenza, High Dose Seasonal PF 03/12/2017  . Influenza, Seasonal, Injecte, Preservative Fre 06/21/2015  . Influenza-Unspecified 07/01/2014  . PPD Test 08/13/2014, 08/30/2015  . Pneumococcal Conjugate-13 08/13/2014  . Pneumococcal Polysaccharide-23 06/14/2016  . Pneumococcal-Unspecified 07/09/1997  . Td 07/09/2004  . Tdap 08/13/2014  . Zoster 06/29/2014   Preventative care: Last colonoscopy: 06/2014 CXR 2002  Prior vaccinations: TD or Tdap: 2016  Influenza: 2018  Pneumococcal: 1999, 2017 Prevnar13: 2016 Shingles/Zostavax: 2015  Names of Other Physician/Practitioners you currently use: 1. [facility] here for primary care 2. None, eye doctor, last visit 10 years 3. Dr. Laural Benes, dentist, last visit 06/10/2016   Patient Care Team: Lucky Cowboy, MD as PCP - General (Internal Medicine)  Surgical: He  has a past surgical history that includes Appendectomy (1960); Ankle surgery (Left, 1983); Tonsillectomy (1958); Knee arthroscopy (Left, 1986, 1987); and Patellar tendon repair (Right, 11/2016). Family His family history includes Colon cancer (age of onset: 90) in his maternal uncle; Colon cancer (age of onset: 77) in his mother. Social history  He reports that he has been smoking cigars.  He has never used smokeless tobacco. He reports that he drinks about 6.0 oz of alcohol per week. He  reports that he does not use drugs.  MEDICARE WELLNESS OBJECTIVES: Physical activity:   Cardiac risk factors:   Depression/mood screen:   Depression screen [facility] 2/9 07/11/2017  Decreased Interest 0  Down, Depressed, Hopeless 0  PHQ - 2 Score 0    ADLs:  In your present state of health, do you have any difficulty performing the following activities: 07/11/2017  Hearing? N  Vision? N  Difficulty concentrating or making decisions? N  Walking or climbing stairs? N  Dressing or bathing? N  Doing errands, shopping? N  Some recent data might be hidden     Cognitive Testing  Alert? Yes  Normal Appearance?Yes  Oriented to person? Yes  Place? Yes   Time? Yes  Recall of three objects?  Yes  Can perform simple calculations? Yes  Displays appropriate judgment?Yes  Can read the correct time from a watch face?Yes  EOL planning:     Objective:   There were no vitals filed for this visit. There is no height or weight on file to calculate BMI.  General appearance: alert, no distress, WD/WN, male HEENT: normocephalic, sclerae anicteric, TMs pearly, nares patent, no discharge or erythema, pharynx normal Oral cavity: MMM, no lesions Neck: supple, no lymphadenopathy, no thyromegaly, no masses Heart: RRR, normal S1, S2, no murmurs Lungs: CTA bilaterally, no wheezes, rhonchi, or rales Abdomen: +bs,  soft, non tender, non distended, no masses, no hepatomegaly, no splenomegaly Musculoskeletal: nontender, no swelling, no obvious deformity Extremities: no edema, no cyanosis, no clubbing Pulses: 2+ symmetric, upper and lower extremities, normal cap refill Neurological: alert, oriented x 3, CN2-12 intact, strength normal upper extremities and lower extremities, sensation normal throughout, DTRs 2+ throughout, no cerebellar signs, gait normal Psychiatric: normal affect, behavior normal, pleasant   Medicare Attestation I have personally reviewed: The patient's medical and social history Their use of  alcohol, tobacco or illicit drugs Their current medications and supplements The patient's functional ability including ADLs,fall risks, home safety risks, cognitive, and hearing and visual impairment Diet and physical activities Evidence for depression or mood disorders  The patient's weight, height, BMI, and visual acuity have been recorded in the chart.  I have made referrals, counseling, and provided education to the patient based on review of the above and I have provided the patient with a written personalized care plan for preventive services.     Dan Maker, NP   10/21/2017

## 2017-10-22 ENCOUNTER — Ambulatory Visit: Payer: Self-pay | Admitting: Adult Health

## 2017-11-06 ENCOUNTER — Encounter: Payer: Self-pay | Admitting: Internal Medicine

## 2017-12-03 NOTE — Progress Notes (Signed)
MEDICARE ANNUAL WELLNESS VISIT AND FOLLOW UP Assessment:   Diagnoses and all orders for this visit:  Encounter for Medicare annual wellness exam  Essential hypertension Continue medication Monitor blood pressure at home; call if consistently over 130/80 Continue DASH diet.   Reminder to go to the ER if any CP, SOB, nausea, dizziness, severe HA, changes vision/speech, left arm numbness and tingling and jaw pain.  Testosterone deficiency - continue replacement therapy, check testosterone levels as needed.   Vitamin D deficiency Near goal at recent check; continue to recommend supplementation for goal of 70-100 Defer vitamin D level  Other abnormal glucose Recent A1Cs at goal Discussed diet/exercise, weight management  Defer A1C; check BMP  Medication management CBC, CMP/GFR  Mixed hyperlipidemia Continue medication Continue low cholesterol diet and exercise.  Check lipid panel.    Over 30 minutes of exam, counseling, chart review, and critical decision making was performed  Future Appointments  Date Time Provider Department Center  03/06/2018  2:00 PM Lucky Cowboy, MD [facility] None     Plan:   During the course of the visit the patient was educated and counseled about appropriate screening and preventive services including:    Pneumococcal vaccine   Influenza vaccine  Prevnar 13  Td vaccine  Screening electrocardiogram  Colorectal cancer screening  Diabetes screening  Glaucoma screening  Nutrition counseling    Subjective:  Brett Perez is a 67 y.o. male who presents for Medicare Annual Wellness Visit and 3 month follow up for HTN, hyperlipidemia, glucose management, and vitamin D Def.   BMI is Body mass index is 27.44 kg/m., he has been working on diet and exercise. Wt Readings from Last 3 Encounters:  12/04/17 208 lb (94.3 kg)  07/10/17 205 lb 9.6 oz (93.3 kg)  03/12/17 199 lb (90.3 kg)   His blood pressure has been controlled at  home, today their BP is BP: 122/80 He does workout. He denies chest pain, shortness of breath, dizziness.   He is on cholesterol medication (crestor 10 mg daily) and denies myalgias. His cholesterol is at goal. The cholesterol last visit was:   Lab Results  Component Value Date   CHOL 199 07/10/2017   HDL 83 07/10/2017   LDLCALC 95 07/10/2017   TRIG 118 07/10/2017   CHOLHDL 2.4 07/10/2017   He has been working on diet and exercise for glucose management, and denies foot ulcerations, increased appetite, nausea, paresthesia of the feet, polydipsia, polyuria, visual disturbances, vomiting and weight loss. Last A1C in the office was:  Lab Results  Component Value Date   HGBA1C 5.1 07/10/2017   Last GFR Lab Results  Component Value Date   GFRNONAA 83 07/10/2017    Patient is on Vitamin D supplement and approaching goal:   Lab Results  Component Value Date   VD25OH 57 07/10/2017     He has a history of testosterone deficiency and is on testosterone replacement. He states that the testosterone helps with his energy, libido, muscle mass. Lab Results  Component Value Date   TESTOSTERONE 244 (L) 07/10/2017     Medication Review:  Current Outpatient Medications (Endocrine & Metabolic):  Marland Kitchen  ANDROGEL PUMP 20.25 MG/ACT (1.62%) GEL, APPLY 2 PUMPS DAILY (1 PUMP TO EACH ARM)  Current Outpatient Medications (Cardiovascular):  .  atenolol (TENORMIN) 50 MG tablet, Take 1 tablet (50 mg total) by mouth daily. .  rosuvastatin (CRESTOR) 10 MG tablet, Take 1 tablet (10 mg total) by mouth daily.  Current Outpatient Medications (Respiratory):  .  ipratropium (ATROVENT) 0.06 % nasal spray, Use 1 to 2 sprays each nostril 2 to 3 x / day (Patient not taking: Reported on 12/04/2017)  Current Outpatient Medications (Analgesics):  .  aspirin 81 MG tablet, Take 81 mg by mouth daily.    Current Outpatient Medications (Other):  Marland Kitchen  Cholecalciferol (VITAMIN D PO), Take by mouth. Takes 4000 units daily .   glucosamine-chondroitin 500-400 MG tablet, Take 1 tablet by mouth 2 (two) times daily. .  Multiple Vitamin (MULTIVITAMIN) tablet, Take 1 tablet by mouth daily. .  Omega-3 Fatty Acids (FISH OIL) 1000 MG CAPS, Take by mouth daily. .  vitamin C (ASCORBIC ACID) 500 MG tablet, Take 500 mg by mouth daily.  Allergies: No Known Allergies  Current Problems (verified) has Hyperlipidemia; Hypertension; Vitamin D deficiency; Testosterone deficiency; Other abnormal glucose; Medication management; and Encounter for Medicare annual wellness exam on their problem list.  Screening Tests Immunization History  Administered Date(s) Administered  . Influenza, High Dose Seasonal PF 03/12/2017  . Influenza, Seasonal, Injecte, Preservative Fre 06/21/2015  . Influenza-Unspecified 07/01/2014  . PPD Test 08/13/2014, 08/30/2015  . Pneumococcal Conjugate-13 08/13/2014  . Pneumococcal Polysaccharide-23 06/14/2016  . Pneumococcal-Unspecified 07/09/1997  . Td 07/09/2004  . Tdap 08/13/2014  . Zoster 06/29/2014   Preventative care: Last colonoscopy: 06/2014 CXR 2002  Prior vaccinations: TD or Tdap: 2016  Influenza: 2018  Pneumococcal: 1999, 2017 Prevnar13: 2016 Shingles/Zostavax: 2015  Names of Other Physician/Practitioners you currently use: 1. [facility] here for primary care 2. Dr. Nile Riggs, eye doctor, last visit 10 years 3. Dr. Laural Benes, dentist, last visit 2019 q6 months  Patient Care Team: Lucky Cowboy, MD as PCP - General (Internal Medicine)  Surgical: He  has a past surgical history that includes Appendectomy (1960); Ankle surgery (Left, 1983); Tonsillectomy (1958); Knee arthroscopy (Left, 1986, 1987); Patellar tendon repair (Right, 11/2016); and Mouth surgery (2012). Family His family history includes Colon cancer (age of onset: 20) in his maternal uncle; Colon cancer (age of onset: 4) in his mother. Social history  He reports that he has been  smoking cigars.  He has never used smokeless tobacco. He reports that he drinks about 6.0 oz of alcohol per week. He reports that he does not use drugs.  MEDICARE WELLNESS OBJECTIVES: Physical activity: Current Exercise Habits: Home exercise routine, Type of exercise: treadmill;strength training/weights(Cycling), Time (Minutes): 60, Frequency (Times/Week): 4, Weekly Exercise (Minutes/Week): 240, Intensity: Moderate, Exercise limited by: None identified Cardiac risk factors: Cardiac Risk Factors include: advanced age (>7men, >42 women);male gender;dyslipidemia;hypertension Depression/mood screen:   Depression screen [facility] 2/9 12/04/2017  Decreased Interest 0  Down, Depressed, Hopeless 0  PHQ - 2 Score 0    ADLs:  In your present state of health, do you have any difficulty performing the following activities: 12/04/2017 07/11/2017  Hearing? N N  Vision? N N  Difficulty concentrating or making decisions? N N  Walking or climbing stairs? N N  Dressing or bathing? N N  Doing errands, shopping? N N  Preparing Food and eating ? N -  Using the Toilet? N -  In the past six months, have you accidently leaked urine? N -  Do you have problems with loss of bowel control? N -  Managing your Medications? N -  Managing your Finances? N -  Housekeeping or managing your Housekeeping? N -  Some recent data might be hidden     Cognitive Testing  Alert? Yes  Normal Appearance?Yes  Oriented to person? Yes  Place? Yes   Time? Yes  Recall of three objects?  Yes  Can perform simple calculations? Yes  Displays appropriate judgment?Yes  Can read the correct time from a watch face?Yes  EOL planning: Does Patient Have a Medical Advance Directive?: Yes Type of Advance Directive: Healthcare Power of Attorney, Living will Does patient want to make changes to medical advance directive?: No - Patient declined Copy of Healthcare Power of Attorney in Chart?: No - copy requested   Objective:   Today's Vitals    12/04/17 1630  BP: 122/80  Pulse: 69  Temp: (!) 97.5 F (36.4 C)  SpO2: 98%  Weight: 208 lb (94.3 kg)  Height:  (1.854 m)   Body mass index is 27.44 kg/m.  General appearance: alert, no distress, WD/WN, male HEENT: normocephalic, sclerae anicteric, TMs pearly, nares patent, no discharge or erythema, pharynx normal Oral cavity: MMM, no lesions Neck: supple, no lymphadenopathy, no thyromegaly, no masses Heart: RRR, normal S1, S2, no murmurs Lungs: CTA bilaterally, no wheezes, rhonchi, or rales Abdomen: +bs, soft, non tender, non distended, no masses, no hepatomegaly, no splenomegaly Musculoskeletal: nontender, no swelling, no obvious deformity Extremities: no edema, no cyanosis, no clubbing Pulses: 2+ symmetric, upper and lower extremities, normal cap refill Neurological: alert, oriented x 3, CN2-12 intact, strength normal upper extremities and lower extremities, sensation normal throughout, DTRs 2+ throughout, no cerebellar signs, gait normal Psychiatric: normal affect, behavior normal, pleasant   Medicare Attestation I have personally reviewed: The patient's medical and social history Their use of alcohol, tobacco or illicit drugs Their current medications and supplements The patient's functional ability including ADLs,fall risks, home safety risks, cognitive, and hearing and visual impairment Diet and physical activities Evidence for depression or mood disorders  The patient's weight, height, BMI, and visual acuity have been recorded in the chart.  I have made referrals, counseling, and provided education to the patient based on review of the above and I have provided the patient with a written personalized care plan for preventive services.     Dan Maker, NP   12/04/2017

## 2017-12-04 ENCOUNTER — Ambulatory Visit (INDEPENDENT_AMBULATORY_CARE_PROVIDER_SITE_OTHER): Payer: Medicare Other | Admitting: Adult Health

## 2017-12-04 ENCOUNTER — Encounter: Payer: Self-pay | Admitting: Adult Health

## 2017-12-04 VITALS — BP 122/80 | HR 69 | Temp 97.5°F | Ht 73.0 in | Wt 208.0 lb

## 2017-12-04 DIAGNOSIS — E559 Vitamin D deficiency, unspecified: Secondary | ICD-10-CM

## 2017-12-04 DIAGNOSIS — I10 Essential (primary) hypertension: Secondary | ICD-10-CM

## 2017-12-04 DIAGNOSIS — Z0001 Encounter for general adult medical examination with abnormal findings: Secondary | ICD-10-CM

## 2017-12-04 DIAGNOSIS — Z0000 Encounter for general adult medical examination without abnormal findings: Secondary | ICD-10-CM

## 2017-12-04 DIAGNOSIS — R7309 Other abnormal glucose: Secondary | ICD-10-CM | POA: Diagnosis not present

## 2017-12-04 DIAGNOSIS — E782 Mixed hyperlipidemia: Secondary | ICD-10-CM | POA: Diagnosis not present

## 2017-12-04 DIAGNOSIS — Z79899 Other long term (current) drug therapy: Secondary | ICD-10-CM

## 2017-12-04 DIAGNOSIS — E349 Endocrine disorder, unspecified: Secondary | ICD-10-CM

## 2017-12-04 DIAGNOSIS — R6889 Other general symptoms and signs: Secondary | ICD-10-CM | POA: Diagnosis not present

## 2017-12-04 NOTE — Patient Instructions (Signed)
Aim for 7+ servings of fruits and vegetables daily  80+ fluid ounces of water or unsweet tea for healthy kidneys  Limit alcohol intake; guidelines recommend max 1-2 per day for men  Limit animal fats in diet for cholesterol and heart health - choose grass fed whenever available  Aim for low stress - take time to unwind and care for your mental health  Aim for 150 min of moderate intensity exercise weekly for heart health, and weights twice weekly for bone health  Aim for 7-9 hours of sleep daily      When it comes to diets, agreement about the perfect plan isn't easy to find, even among the experts. Experts at the [employer] [place] [employer] developed an idea known as the Healthy Eating Plate. Just imagine a plate divided into logical, healthy portions.  The emphasis is on diet quality:  Load up on vegetables and fruits - one-half of your plate: Aim for color and variety, and remember that potatoes don't count.  Go for whole grains - one-quarter of your plate: Whole wheat, barley, wheat berries, quinoa, oats, brown rice, and foods made with them. If you want pasta, go with whole wheat pasta.  Protein power - one-quarter of your plate: Fish, chicken, beans, and nuts are all healthy, versatile protein sources. Limit red meat.  The diet, however, does go beyond the plate, offering a few other suggestions.  Use healthy plant oils, such as olive, canola, soy, corn, sunflower and peanut. Check the labels, and avoid partially hydrogenated oil, which have unhealthy trans fats.  If you're thirsty, drink water. Coffee and tea are good in moderation, but skip sugary drinks and limit milk and dairy products to one or two daily servings.  The type of carbohydrate in the diet is more important than the amount. Some sources of carbohydrates, such as vegetables, fruits, whole grains, and beans-are healthier than others.  Finally, stay active.

## 2017-12-05 LAB — LIPID PANEL
CHOL/HDL RATIO: 2.2 (calc) (ref <5.0)
Cholesterol: 173 mg/dL (ref <200)
HDL: 77 mg/dL (ref >40)
LDL CHOLESTEROL (CALC): 72 mg/dL
NON-HDL CHOLESTEROL (CALC): 96 mg/dL (ref <130)
Triglycerides: 164 mg/dL — ABNORMAL HIGH (ref <150)

## 2017-12-05 LAB — CBC WITH DIFFERENTIAL/PLATELET
BASOS PCT: 0.3 %
Basophils Absolute: 20 cells/uL (ref 0–200)
EOS PCT: 1.9 %
Eosinophils Absolute: 129 cells/uL (ref 15–500)
HEMATOCRIT: 43.9 % (ref 38.5–50.0)
Hemoglobin: 15.7 g/dL (ref 13.2–17.1)
LYMPHS ABS: 2808 {cells}/uL (ref 850–3900)
MCH: 31.8 pg (ref 27.0–33.0)
MCHC: 35.8 g/dL (ref 32.0–36.0)
MCV: 89 fL (ref 80.0–100.0)
MPV: 9.7 fL (ref 7.5–12.5)
Monocytes Relative: 8.9 %
NEUTROS ABS: 3237 {cells}/uL (ref 1500–7800)
Neutrophils Relative %: 47.6 %
Platelets: 259 10*3/uL (ref 140–400)
RBC: 4.93 10*6/uL (ref 4.20–5.80)
RDW: 12.5 % (ref 11.0–15.0)
Total Lymphocyte: 41.3 %
WBC mixed population: 605 cells/uL (ref 200–950)
WBC: 6.8 10*3/uL (ref 3.8–10.8)

## 2017-12-05 LAB — TSH: TSH: 1.28 mIU/L (ref 0.40–4.50)

## 2017-12-05 LAB — COMPLETE METABOLIC PANEL WITH GFR
AG Ratio: 1.9 (calc) (ref 1.0–2.5)
ALBUMIN MSPROF: 4.5 g/dL (ref 3.6–5.1)
ALT: 30 U/L (ref 9–46)
AST: 25 U/L (ref 10–35)
Alkaline phosphatase (APISO): 52 U/L (ref 40–115)
BUN: 15 mg/dL (ref 7–25)
CALCIUM: 9.1 mg/dL (ref 8.6–10.3)
CO2: 24 mmol/L (ref 20–32)
CREATININE: 0.93 mg/dL (ref 0.70–1.25)
Chloride: 106 mmol/L (ref 98–110)
GFR, EST NON AFRICAN AMERICAN: 85 mL/min/{1.73_m2} (ref >=60)
GFR, Est African American: 99 mL/min/{1.73_m2} (ref >=60)
GLOBULIN: 2.4 g/dL (ref 1.9–3.7)
Glucose, Bld: 99 mg/dL (ref 65–99)
Potassium: 4.2 mmol/L (ref 3.5–5.3)
SODIUM: 138 mmol/L (ref 135–146)
Total Bilirubin: 0.5 mg/dL (ref 0.2–1.2)
Total Protein: 6.9 g/dL (ref 6.1–8.1)

## 2018-02-10 ENCOUNTER — Other Ambulatory Visit: Payer: Self-pay | Admitting: Internal Medicine

## 2018-02-24 ENCOUNTER — Encounter: Payer: Self-pay | Admitting: Internal Medicine

## 2018-03-06 ENCOUNTER — Encounter: Payer: Self-pay | Admitting: Internal Medicine

## 2018-03-27 ENCOUNTER — Ambulatory Visit (INDEPENDENT_AMBULATORY_CARE_PROVIDER_SITE_OTHER): Payer: Medicare Other | Admitting: Internal Medicine

## 2018-03-27 ENCOUNTER — Encounter: Payer: Self-pay | Admitting: Internal Medicine

## 2018-03-27 VITALS — BP 138/78 | HR 80 | Temp 97.9°F | Resp 16 | Ht 73.0 in | Wt 204.4 lb

## 2018-03-27 DIAGNOSIS — E559 Vitamin D deficiency, unspecified: Secondary | ICD-10-CM | POA: Diagnosis not present

## 2018-03-27 DIAGNOSIS — Z125 Encounter for screening for malignant neoplasm of prostate: Secondary | ICD-10-CM | POA: Diagnosis not present

## 2018-03-27 DIAGNOSIS — I10 Essential (primary) hypertension: Secondary | ICD-10-CM

## 2018-03-27 DIAGNOSIS — E349 Endocrine disorder, unspecified: Secondary | ICD-10-CM

## 2018-03-27 DIAGNOSIS — R7309 Other abnormal glucose: Secondary | ICD-10-CM | POA: Diagnosis not present

## 2018-03-27 DIAGNOSIS — Z23 Encounter for immunization: Secondary | ICD-10-CM | POA: Diagnosis not present

## 2018-03-27 DIAGNOSIS — N401 Enlarged prostate with lower urinary tract symptoms: Secondary | ICD-10-CM | POA: Diagnosis not present

## 2018-03-27 DIAGNOSIS — F17200 Nicotine dependence, unspecified, uncomplicated: Secondary | ICD-10-CM

## 2018-03-27 DIAGNOSIS — N138 Other obstructive and reflux uropathy: Secondary | ICD-10-CM

## 2018-03-27 DIAGNOSIS — Z79899 Other long term (current) drug therapy: Secondary | ICD-10-CM

## 2018-03-27 DIAGNOSIS — I700 Atherosclerosis of aorta: Secondary | ICD-10-CM

## 2018-03-27 DIAGNOSIS — Z1211 Encounter for screening for malignant neoplasm of colon: Secondary | ICD-10-CM

## 2018-03-27 DIAGNOSIS — Z136 Encounter for screening for cardiovascular disorders: Secondary | ICD-10-CM

## 2018-03-27 DIAGNOSIS — Z1212 Encounter for screening for malignant neoplasm of rectum: Secondary | ICD-10-CM

## 2018-03-27 DIAGNOSIS — E782 Mixed hyperlipidemia: Secondary | ICD-10-CM | POA: Diagnosis not present

## 2018-03-27 NOTE — Progress Notes (Signed)
Patient ID: Brett Perez, male   DOB: 02/18/1951, 67 y.o.   MRN: [id number] [facility]   Brett Perez, M.D.     Brett CarrelAmanda R. Steffanie Perez, P.A.-C Brett GaudierAshley Corbett, DNP [facility]                [address]                [place], [place] [id number] Telephone [phone]) [phone] Telefax [phone]) [phone] Annual  Screening/Preventative Visit  & Comprehensive Evaluation & Examination     This very nice 67 y.o. MWM presents for a Screening /Preventative Visit & comprehensive evaluation and management of multiple medical co-morbidities.  Patient has been followed for HTN, HLD, Prediabetes and Vitamin D Deficiency.     HTN predates circa 2006.  Patient's BP has been controlled at home.  Today's BP is at goal - 138/78. Patient denies any cardiac symptoms as chest pain, palpitations, shortness of breath, dizziness or ankle swelling.     Patient's hyperlipidemia is controlled with diet and medications. Patient denies myalgias or other medication SE's. Last lipids were at goal albeit elevated Trig's: Lab Results  Component Value Date   CHOL 173 12/04/2017   HDL 77 12/04/2017   LDLCALC 72 12/04/2017   TRIG 164 (H) 12/04/2017   CHOLHDL 2.2 12/04/2017      Patient is monitored proactively for prediabetes and patient denies reactive hypoglycemic symptoms, visual blurring, diabetic polys or paresthesias. Last A1c was at goal: Lab Results  Component Value Date   HGBA1C 5.1 07/10/2017       Patient has hx/o Low T ("228.87/2013)  and has been on treatment with Androgel with improved stamina & sense of well being.       Finally, patient has history of Vitamin D Deficiency ("28"/2008)  and last vitamin D was at goal: Lab Results  Component Value Date   VD25OH 57 07/10/2017   Current Outpatient Medications on File Prior to Visit  Medication Sig  . ANDROGEL PUMP 20.25 MG/ACT (1.62%) GEL APPLY 2 PUMPS DAILY (1 PUMP TO EACH ARM)  . aspirin 81 MG  tablet Take 81 mg by mouth daily.   Marland Kitchen. atenolol (TENORMIN) 100 MG tablet TAKE 1 TABLET BY MOUTH EVERY DAY  . Cholecalciferol (VITAMIN D PO) Take by mouth. Takes 4000 units daily  . glucosamine-chondroitin 500-400 MG tablet Take 1 tablet by mouth 2 (two) times daily.  Marland Kitchen. ipratropium (ATROVENT) 0.06 % nasal spray Use 1 to 2 sprays each nostril 2 to 3 x / day  . Multiple Vitamin (MULTIVITAMIN) tablet Take 1 tablet by mouth daily.  . Omega-3 Fatty Acids (FISH OIL) 1000 MG CAPS Take by mouth daily.  . rosuvastatin (CRESTOR) 40 MG tablet Take 40 mg by mouth daily.  . vitamin C (ASCORBIC ACID) 500 MG tablet Take 500 mg by mouth daily.   No current facility-administered medications on file prior to visit.    No Known Allergies   Past Medical History:  Diagnosis Date  . Hyperlipidemia   . Hypertension   . Other testicular hypofunction   . Vitamin D deficiency    Health Maintenance  Topic Date Due  . INFLUENZA VACCINE  02/06/2018  . COLONOSCOPY  07/31/2018  . TETANUS/TDAP  08/13/2024  . Hepatitis C Screening  Completed  . PNA vac Low Risk Adult  Completed   Immunization History  Administered Date(s) Administered  . Influenza, High Dose Seasonal PF 03/12/2017, 03/27/2018  . Influenza, Seasonal, Injecte,  Preservative Fre 06/21/2015  . Influenza-Unspecified 07/01/2014  . PPD Test 08/13/2014, 08/30/2015  . Pneumococcal Conjugate-13 08/13/2014  . Pneumococcal Polysaccharide-23 06/14/2016  . Pneumococcal-Unspecified 07/09/1997  . Td 07/09/2004  . Tdap 08/13/2014  . Zoster 06/29/2014   Last Colon - 07/31/2013 Dr Arlyce Dice - recc f/u 5 yrs in Jan 2020.  Past Surgical History:  Procedure Laterality Date  . ANKLE SURGERY Left 1983   for bone spur  . APPENDECTOMY  1960  . KNEE ARTHROSCOPY Left 1986, 1987  . MOUTH SURGERY  2012   For implants  . PATELLAR TENDON REPAIR Right 11/2016   [facility]  . TONSILLECTOMY  1958   Family History  Problem Relation Age of Onset  . Colon cancer Mother 36   . Colon cancer Maternal Uncle 35   Social History   Socioeconomic History  . Marital status: Married    Spouse name: Mary  . Number of children: 1 son & 1 daughter  . Highest education level: J.D.  Occupational History  . [employer]  Tobacco Use  . Smoking status: Current Some Day Smoker    Types: Cigars  . Smokeless tobacco: Never Used  . Tobacco comment: rarely smokes a  cigar  Substance and Sexual Activity  . Alcohol use: Yes    Alcohol/week: 10.0 standard drinks    Types: 10 Cans of beer per week  . Drug use: No  . Sexual activity: Not on file    ROS Constitutional: Denies fever, chills, weight loss/gain, headaches, insomnia,  night sweats or change in appetite. Does c/o fatigue. Eyes: Denies redness, blurred vision, diplopia, discharge, itchy or watery eyes.  ENT: Denies discharge, congestion, post nasal drip, epistaxis, sore throat, earache, hearing loss, dental pain, Tinnitus, Vertigo, Sinus pain or snoring.  Cardio: Denies chest pain, palpitations, irregular heartbeat, syncope, dyspnea, diaphoresis, orthopnea, PND, claudication or edema Respiratory: denies cough, dyspnea, DOE, pleurisy, hoarseness, laryngitis or wheezing.  Gastrointestinal: Denies dysphagia, heartburn, reflux, water brash, pain, cramps, nausea, vomiting, bloating, diarrhea, constipation, hematemesis, melena, hematochezia, jaundice or hemorrhoids Genitourinary: Denies dysuria, frequency, urgency, nocturia, hesitancy, discharge, hematuria or flank pain Musculoskeletal: Denies arthralgia, myalgia, stiffness, Jt. Swelling, pain, limp or strain/sprain. Denies Falls. Skin: Denies puritis, rash, hives, warts, acne, eczema or change in skin lesion Neuro: No weakness, tremor, incoordination, spasms, paresthesia or pain Psychiatric: Denies confusion, memory loss or sensory loss. Denies Depression. Endocrine: Denies change in weight, skin, hair change, nocturia, and paresthesia, diabetic polys, visual blurring or hyper /  hypo glycemic episodes.  Heme/Lymph: No excessive bleeding, bruising or enlarged lymph nodes.  Physical Exam  BP 138/78   Pulse 80   Temp 97.9 F (36.6 C)   Resp 16   Ht 6\' 1"  (1.854 m)   Wt 204 lb 6.4 oz (92.7 kg)   BMI 26.97 kg/m    General Appearance: Well nourished and well groomed and in no apparent distress.  Eyes: PERRLA, EOMs, conjunctiva no swelling or erythema, normal fundi and vessels. Sinuses: No frontal/maxillary tenderness ENT/Mouth: EACs patent / TMs  nl. Nares clear without erythema, swelling, mucoid exudates. Oral hygiene is good. No erythema, swelling, or exudate. Tongue normal, non-obstructing. Tonsils not swollen or erythematous. Hearing normal.  Neck: Supple, thyroid not palpable. No bruits, nodes or JVD. Respiratory: Respiratory effort normal.  BS equal and clear bilateral without rales, rhonci, wheezing or stridor. Cardio: Heart sounds are normal with regular rate and rhythm and no murmurs, rubs or gallops. Peripheral pulses are normal and equal bilaterally without edema. No aortic or femoral  bruits. Chest: symmetric with normal excursions and percussion.  Abdomen: Soft, with Nl bowel sounds. Nontender, no guarding, rebound, hernias, masses, or organomegaly.  Lymphatics: Non tender without lymphadenopathy.  Genitourinary: No hernias.Testes nl. DRE - prostate nl for age - smooth & firm w/o nodules. Musculoskeletal: Full ROM all peripheral extremities, joint stability, 5/5 strength, and normal gait. Skin: Warm and dry without rashes, lesions, cyanosis, clubbing or  ecchymosis.  Neuro: Cranial nerves intact, reflexes equal bilaterally. Normal muscle tone, no cerebellar symptoms. Sensation intact.  Pysch: Alert and oriented X 3 with normal affect, insight and judgment appropriate.   Assessment and Plan  1. Essential hypertension  - EKG 12-Lead - [place], RETROPERITNL ABD,  LTD - Urinalysis, Routine w reflex microscopic - Microalbumin / creatinine urine ratio -  CBC with Differential/Platelet - COMPLETE METABOLIC PANEL WITH GFR - Magnesium - TSH  2. Hyperlipidemia, mixed  - EKG 12-Lead - [place], RETROPERITNL ABD,  LTD - Lipid panel - TSH  3. Abnormal glucose  - EKG 12-Lead - [place], RETROPERITNL ABD,  LTD - Hemoglobin A1c - Insulin, random  4. Vitamin D deficiency  - VITAMIN D 25 Hydroxyl  5. Testosterone deficiency  - Testosterone  6. Encounter for colorectal cancer screening  - POC Hemoccult Bld/Stl   7. BPH with obstruction/lower urinary tract symptoms  - PSA  8. Prostate cancer screening  - PSA  9. Screening for ischemic heart disease  - EKG 12-Lead  10. Smoker  - EKG 12-Lead - [place], RETROPERITNL ABD,  LTD  11. Aortic atherosclerosis (HCC)  - [place], RETROPERITNL ABD,  LTD  12. Screening for AAA (aortic abdominal aneurysm)  - [place], RETROPERITNL ABD,  LTD  13. Medication management  - Urinalysis, Routine w reflex microscopic - Microalbumin / creatinine urine ratio - Testosterone - CBC with Differential/Platelet - COMPLETE METABOLIC PANEL WITH GFR - Magnesium - Lipid panel - TSH - Hemoglobin A1c - Insulin, random - VITAMIN D 25 Hydroxyl  14. Need for prophylactic vaccination and inoculation against influenza  - Flu vaccine HIGH DOSE PF (Fluzone High dose)       Patient was counseled in prudent diet, weight control to achieve/maintain BMI less than 25, BP monitoring, regular exercise and medications as discussed.  Discussed med effects and SE's. Routine screening labs and tests as requested with regular follow-up as recommended. Over 40 minutes of exam, counseling, chart review and high complex critical decision making was performed

## 2018-03-27 NOTE — Patient Instructions (Signed)

## 2018-03-28 LAB — CBC WITH DIFFERENTIAL/PLATELET
BASOS ABS: 43 {cells}/uL (ref 0–200)
Basophils Relative: 0.6 %
EOS ABS: 202 {cells}/uL (ref 15–500)
Eosinophils Relative: 2.8 %
HCT: 43.9 % (ref 38.5–50.0)
Hemoglobin: 15.4 g/dL (ref 13.2–17.1)
Lymphs Abs: 2779 cells/uL (ref 850–3900)
MCH: 31.7 pg (ref 27.0–33.0)
MCHC: 35.1 g/dL (ref 32.0–36.0)
MCV: 90.3 fL (ref 80.0–100.0)
MONOS PCT: 8.6 %
MPV: 9.8 fL (ref 7.5–12.5)
Neutro Abs: 3557 cells/uL (ref 1500–7800)
Neutrophils Relative %: 49.4 %
Platelets: 261 10*3/uL (ref 140–400)
RBC: 4.86 10*6/uL (ref 4.20–5.80)
RDW: 12.8 % (ref 11.0–15.0)
TOTAL LYMPHOCYTE: 38.6 %
WBC: 7.2 10*3/uL (ref 3.8–10.8)
WBCMIX: 619 {cells}/uL (ref 200–950)

## 2018-03-28 LAB — HEMOGLOBIN A1C
Hgb A1c MFr Bld: 5.1 % of total Hgb (ref <5.7)
MEAN PLASMA GLUCOSE: 100 (calc)
eAG (mmol/L): 5.5 (calc)

## 2018-03-28 LAB — COMPLETE METABOLIC PANEL WITH GFR
AG RATIO: 1.9 (calc) (ref 1.0–2.5)
ALKALINE PHOSPHATASE (APISO): 53 U/L (ref 40–115)
ALT: 23 U/L (ref 9–46)
AST: 24 U/L (ref 10–35)
Albumin: 4.5 g/dL (ref 3.6–5.1)
BILIRUBIN TOTAL: 0.6 mg/dL (ref 0.2–1.2)
BUN: 15 mg/dL (ref 7–25)
CHLORIDE: 101 mmol/L (ref 98–110)
CO2: 29 mmol/L (ref 20–32)
Calcium: 9.4 mg/dL (ref 8.6–10.3)
Creat: 0.96 mg/dL (ref 0.70–1.25)
GFR, Est African American: 95 mL/min/{1.73_m2} (ref >=60)
GFR, Est Non African American: 82 mL/min/{1.73_m2} (ref >=60)
GLUCOSE: 110 mg/dL — AB (ref 65–99)
Globulin: 2.4 g/dL (calc) (ref 1.9–3.7)
POTASSIUM: 3.9 mmol/L (ref 3.5–5.3)
SODIUM: 139 mmol/L (ref 135–146)
Total Protein: 6.9 g/dL (ref 6.1–8.1)

## 2018-03-28 LAB — TSH: TSH: 1.63 m[IU]/L (ref 0.40–4.50)

## 2018-03-28 LAB — URINALYSIS, ROUTINE W REFLEX MICROSCOPIC
Bilirubin Urine: NEGATIVE
Glucose, UA: NEGATIVE
HGB URINE DIPSTICK: NEGATIVE
Leukocytes, UA: NEGATIVE
NITRITE: NEGATIVE
PROTEIN: NEGATIVE
SPECIFIC GRAVITY, URINE: 1.023 (ref 1.001–1.03)
pH: 5.5 (ref 5.0–8.0)

## 2018-03-28 LAB — VITAMIN D 25 HYDROXY (VIT D DEFICIENCY, FRACTURES): Vit D, 25-Hydroxy: 71 ng/mL (ref 30–100)

## 2018-03-28 LAB — INSULIN, RANDOM: Insulin: 23.7 u[IU]/mL — ABNORMAL HIGH (ref 2.0–19.6)

## 2018-03-28 LAB — MAGNESIUM: MAGNESIUM: 2.2 mg/dL (ref 1.5–2.5)

## 2018-03-28 LAB — LIPID PANEL
CHOL/HDL RATIO: 2.4 (calc) (ref <5.0)
Cholesterol: 184 mg/dL (ref <200)
HDL: 78 mg/dL (ref >40)
LDL CHOLESTEROL (CALC): 80 mg/dL
NON-HDL CHOLESTEROL (CALC): 106 mg/dL (ref <130)
TRIGLYCERIDES: 154 mg/dL — AB (ref <150)

## 2018-03-28 LAB — PSA: PSA: 1.2 ng/mL (ref <=4.0)

## 2018-03-28 LAB — MICROALBUMIN / CREATININE URINE RATIO
Creatinine, Urine: 134 mg/dL (ref 20–320)
MICROALB UR: 0.2 mg/dL
Microalb Creat Ratio: 1 mcg/mg creat (ref <30)

## 2018-03-28 LAB — TESTOSTERONE: Testosterone: 321 ng/dL (ref 250–827)

## 2018-04-22 ENCOUNTER — Other Ambulatory Visit: Payer: Self-pay

## 2018-04-22 DIAGNOSIS — Z1212 Encounter for screening for malignant neoplasm of rectum: Principal | ICD-10-CM

## 2018-04-22 DIAGNOSIS — Z1211 Encounter for screening for malignant neoplasm of colon: Secondary | ICD-10-CM

## 2018-04-22 LAB — POC HEMOCCULT BLD/STL (HOME/3-CARD/SCREEN)
Card #2 Fecal Occult Blod, POC: NEGATIVE
FECAL OCCULT BLD: NEGATIVE
Fecal Occult Blood, POC: NEGATIVE

## 2018-04-23 ENCOUNTER — Other Ambulatory Visit: Payer: Self-pay | Admitting: Internal Medicine

## 2018-07-07 ENCOUNTER — Ambulatory Visit: Payer: Self-pay | Admitting: Physician Assistant

## 2018-07-18 NOTE — Progress Notes (Signed)
MEDICARE ANNUAL WELLNESS VISIT AND FOLLOW UP Assessment:   Back pain Will do conservative treatment with PT/mobic x 4 weeks and if not better will get imaging Negative straight leg, benign exam  Encounter for Medicare annual wellness exam No labs today, has follow up in 2 months  Essential hypertension Continue medication Monitor blood pressure at home; call if consistently over 130/80 Continue DASH diet.   Reminder to go to the ER if any CP, SOB, nausea, dizziness, severe HA, changes vision/speech, left arm numbness and tingling and jaw pain.  Testosterone deficiency - continue replacement therapy, check testosterone levels as needed.   Vitamin D deficiency Near goal at recent check; continue to recommend supplementation for goal of 70-100 Defer vitamin D level  Other abnormal glucose Recent A1Cs at goal Discussed diet/exercise, weight management  Defer A1C; check BMP  Medication management CBC, CMP/GFR  Mixed hyperlipidemia Continue medication Continue low cholesterol diet and exercise.  Check lipid panel.    Over 30 minutes of exam, counseling, chart review, and critical decision making was performed  Future Appointments  Date Time Provider Department Center  10/06/2018  4:00 PM Lucky CowboyMcKeown, William, MD [facility] None  05/06/2019  3:00 PM Lucky CowboyMcKeown, William, MD [facility] None     Plan:   During the course of the visit the patient was educated and counseled about appropriate screening and preventive services including:    Pneumococcal vaccine   Influenza vaccine  Prevnar 13  Td vaccine  Screening electrocardiogram  Colorectal cancer screening  Diabetes screening  Glaucoma screening  Nutrition counseling    Subjective:  Brett Perez is a 68 y.o. male who presents for Medicare Annual Wellness Visit and 3 month follow up for HTN, hyperlipidemia, glucose management, and vitamin D Def.   He goes to the gym frequently, no injury, lower left back  pain, has been 6-8 weeks. Has tried massage. Worse with standing, can have achiness in his medial thigh, a few tingling episodes down his leg. He will take occ ibuprofen. Called Dr. Verlee RossettiElsner's office.  Patient denies fever, hematuria, incontinence and saddle anesthesia   BMI is Body mass index is 27.44 kg/m., he has been working on diet and exercise. Wt Readings from Last 3 Encounters:  07/21/18 208 lb (94.3 kg)  03/27/18 204 lb 6.4 oz (92.7 kg)  12/04/17 208 lb (94.3 kg)   His blood pressure has been controlled at home, today their BP is BP: 118/64 He does workout. He denies chest pain, shortness of breath, dizziness.   He is on cholesterol medication (crestor 10 mg daily) and denies myalgias. His cholesterol is at goal. The cholesterol last visit was:   Lab Results  Component Value Date   CHOL 184 03/27/2018   HDL 78 03/27/2018   LDLCALC 80 03/27/2018   TRIG 154 (H) 03/27/2018   CHOLHDL 2.4 03/27/2018   He has been working on diet and exercise for glucose management, and denies foot ulcerations, increased appetite, nausea, paresthesia of the feet, polydipsia, polyuria, visual disturbances, vomiting and weight loss. Last A1C in the office was:  Lab Results  Component Value Date   HGBA1C 5.1 03/27/2018   Last GFR Lab Results  Component Value Date   GFRNONAA 82 03/27/2018    Patient is on Vitamin D supplement and approaching goal:   Lab Results  Component Value Date   VD25OH 5271 03/27/2018     He has a history of testosterone deficiency and is on testosterone replacement. He states that the testosterone helps with  his energy, libido, muscle mass. Lab Results  Component Value Date   TESTOSTERONE 321 03/27/2018     Medication Review:  Current Outpatient Medications (Endocrine & Metabolic):  Marland Kitchen  Testosterone 20.25 MG/ACT (1.62%) GEL, APPLY 2 PUMPS DAILY (1 PUMP TO EACH ARM)  Current Outpatient Medications (Cardiovascular):  .  atenolol (TENORMIN) 100 MG tablet, TAKE 1  TABLET BY MOUTH EVERY DAY .  rosuvastatin (CRESTOR) 10 MG tablet, Take 1 tablet (10 mg total) by mouth daily.  Current Outpatient Medications (Respiratory):  .  ipratropium (ATROVENT) 0.06 % nasal spray, Use 1 to 2 sprays each nostril 2 to 3 x / day  Current Outpatient Medications (Analgesics):  .  aspirin 81 MG tablet, Take 81 mg by mouth daily.  .  meloxicam (MOBIC) 15 MG tablet, Take one daily with food for 2 weeks, can take with tylenol, can not take with aleve, iburpofen, then as needed daily for pain   Current Outpatient Medications (Other):  Marland Kitchen  Cholecalciferol (VITAMIN D PO), Take by mouth. Takes 4000 units daily .  Multiple Vitamin (MULTIVITAMIN) tablet, Take 1 tablet by mouth daily. .  Omega-3 Fatty Acids (FISH OIL) 1000 MG CAPS, Take by mouth daily. .  vitamin C (ASCORBIC ACID) 500 MG tablet, Take 500 mg by mouth daily.  Allergies: Not on File  Current Problems (verified) has Hyperlipidemia; Hypertension; Vitamin D deficiency; Testosterone deficiency; Other abnormal glucose; Medication management; and Encounter for Medicare annual wellness exam on their problem list.  Screening Tests Immunization History  Administered Date(s) Administered  . Influenza, High Dose Seasonal PF 03/12/2017, 03/27/2018  . Influenza, Seasonal, Injecte, Preservative Fre 06/21/2015  . Influenza-Unspecified 07/01/2014  . PPD Test 08/13/2014, 08/30/2015  . Pneumococcal Conjugate-13 08/13/2014  . Pneumococcal Polysaccharide-23 06/14/2016  . Pneumococcal-Unspecified 07/09/1997  . Td 07/09/2004  . Tdap 08/13/2014  . Zoster 06/29/2014   Preventative care: Last colonoscopy: 06/2014 CXR 2002 [place] AB 2004  Prior vaccinations: TD or Tdap: 2016  Influenza: 2019  Pneumococcal: 1999, 2017 Prevnar13: 2016 Shingles/Zostavax: 2015  Names of Other Physician/Practitioners you currently use: 1. [facility] here for primary care 2. Dr. Nile Riggs, eye doctor, last  visit 10 years 3. Dr. Yetta Barre, dentist, last visit 2019 q6 months  Patient Care Team: Lucky Cowboy, MD as PCP - General (Internal Medicine)  Surgical: He  has a past surgical history that includes Appendectomy (1960); Ankle surgery (Left, 1983); Tonsillectomy (1958); Knee arthroscopy (Left, 1986, 1987); Patellar tendon repair (Right, 11/2016); and Mouth surgery (2012). Family His family history includes Colon cancer (age of onset: 15) in his maternal uncle; Colon cancer (age of onset: 44) in his mother. Social history  He reports that he has been smoking cigars. He has never used smokeless tobacco. He reports current alcohol use of about 10.0 standard drinks of alcohol per week. He reports that he does not use drugs.  MEDICARE WELLNESS OBJECTIVES: Physical activity: Current Exercise Habits: Structured exercise class, Type of exercise: strength training/weights;walking, Time (Minutes): 20, Frequency (Times/Week): 4, Weekly Exercise (Minutes/Week): 80, Intensity: Mild Cardiac risk factors: Cardiac Risk Factors include: advanced age (>97men, >35 women);male gender;dyslipidemia;hypertension Depression/mood screen:   Depression screen [facility] 2/9 07/21/2018  Decreased Interest 0  Down, Depressed, Hopeless 0  PHQ - 2 Score 0    ADLs:  In your present state of health, do you have any difficulty performing the following activities: 07/21/2018 03/27/2018  Hearing? N N  Vision? N N  Difficulty concentrating or making decisions? N N  Walking or  climbing stairs? N N  Dressing or bathing? N N  Doing errands, shopping? N N  Preparing Food and eating ? - -  Using the Toilet? - -  In the past six months, have you accidently leaked urine? - -  Do you have problems with loss of bowel control? - -  Managing your Medications? - -  Managing your Finances? - -  Housekeeping or managing your Housekeeping? - -  Some recent data might be hidden     Cognitive Testing  Alert? Yes  Normal  Appearance?Yes  Oriented to person? Yes  Place? Yes   Time? Yes  Recall of three objects?  Yes  Can perform simple calculations? Yes  Displays appropriate judgment?Yes  Can read the correct time from a watch face?Yes  EOL planning: Does Patient Have a Medical Advance Directive?: Yes Type of Advance Directive: Healthcare Power of Attorney, Living will Copy of Healthcare Power of Attorney in Chart?: No - copy requested   Objective:   Today's Vitals   07/21/18 1432  BP: 118/64  Pulse: 67  Temp: 98.1 F (36.7 C)  SpO2: 97%  Weight: 208 lb (94.3 kg)  Height: 6\' 1"  (1.854 m)  PainSc: 2   PainLoc: Back   Body mass index is 27.44 kg/m.  General appearance: alert, no distress, WD/WN, male HEENT: normocephalic, sclerae anicteric, TMs pearly, nares patent, no discharge or erythema, pharynx normal Oral cavity: MMM, no lesions Neck: supple, no lymphadenopathy, no thyromegaly, no masses Heart: RRR, normal S1, S2, no murmurs Lungs: CTA bilaterally, no wheezes, rhonchi, or rales Abdomen: +bs, soft, non tender, non distended, no masses, no hepatomegaly, no splenomegaly Musculoskeletal: Patient is able to ambulate well. Gait is not  Antalgic. Straight leg raising with dorsiflexion negative bilaterally for radicular symptoms. Sensory exam in the legs are normal. Knee reflexes are normal Ankle reflexes are normal Strength is normal and symmetric in arms and legs. There is not SI tenderness to palpation.  There is paraspinal muscle spasm.  There is not midline tenderness.  FROM of spine without pain in all spheres.  Extremities: no edema, no cyanosis, no clubbing Pulses: 2+ symmetric, upper and lower extremities, normal cap refill Neurological: alert, oriented x 3, CN2-12 intact, strength normal upper extremities and lower extremities, sensation normal throughout, DTRs 2+ throughout, no cerebellar signs, gait normal Psychiatric: normal affect, behavior normal, pleasant   Medicare Attestation I  have personally reviewed: The patient's medical and social history Their use of alcohol, tobacco or illicit drugs Their current medications and supplements The patient's functional ability including ADLs,fall risks, home safety risks, cognitive, and hearing and visual impairment Diet and physical activities Evidence for depression or mood disorders  The patient's weight, height, BMI, and visual acuity have been recorded in the chart.  I have made referrals, counseling, and provided education to the patient based on review of the above and I have provided the patient with a written personalized care plan for preventive services.     Quentin Mullingmanda Collier, PA-C   07/21/2018

## 2018-07-21 ENCOUNTER — Encounter: Payer: Self-pay | Admitting: Physician Assistant

## 2018-07-21 ENCOUNTER — Ambulatory Visit (INDEPENDENT_AMBULATORY_CARE_PROVIDER_SITE_OTHER): Payer: PPO | Admitting: Physician Assistant

## 2018-07-21 VITALS — BP 118/64 | HR 67 | Temp 98.1°F | Ht 73.0 in | Wt 208.0 lb

## 2018-07-21 DIAGNOSIS — Z0001 Encounter for general adult medical examination with abnormal findings: Secondary | ICD-10-CM

## 2018-07-21 DIAGNOSIS — Z79899 Other long term (current) drug therapy: Secondary | ICD-10-CM | POA: Diagnosis not present

## 2018-07-21 DIAGNOSIS — R6889 Other general symptoms and signs: Secondary | ICD-10-CM | POA: Diagnosis not present

## 2018-07-21 DIAGNOSIS — E349 Endocrine disorder, unspecified: Secondary | ICD-10-CM

## 2018-07-21 DIAGNOSIS — E782 Mixed hyperlipidemia: Secondary | ICD-10-CM | POA: Diagnosis not present

## 2018-07-21 DIAGNOSIS — I10 Essential (primary) hypertension: Secondary | ICD-10-CM

## 2018-07-21 DIAGNOSIS — E559 Vitamin D deficiency, unspecified: Secondary | ICD-10-CM | POA: Diagnosis not present

## 2018-07-21 DIAGNOSIS — R7309 Other abnormal glucose: Secondary | ICD-10-CM

## 2018-07-21 DIAGNOSIS — M5442 Lumbago with sciatica, left side: Secondary | ICD-10-CM | POA: Diagnosis not present

## 2018-07-21 DIAGNOSIS — Z0000 Encounter for general adult medical examination without abnormal findings: Secondary | ICD-10-CM

## 2018-07-21 MED ORDER — ROSUVASTATIN CALCIUM 10 MG PO TABS: 10.0000 mg | ORAL_TABLET | Freq: Every day | ORAL | 3 refills | Status: DC

## 2018-07-21 MED ORDER — MELOXICAM 15 MG PO TABS: ORAL_TABLET | ORAL | 1 refills | Status: DC

## 2018-07-21 NOTE — Patient Instructions (Addendum)
Mobic is an antiinflammatory It helps pain, can not take with aleve, or ibuprofen You can take tylenol (500mg ) or tylenol arthritis (650mg ) with the meloxicam/antiinflammatories. The max you can take of tylenol a day is 3000mg  daily, this is a max of 6 pills a day of the regular tyelnol (500mg ) or a max of 4 a day of the tylenol arthritis (650mg ) as long as no other medications you are taking contain tylenol.   Mobic can cause inflammation in your stomach and can cause ulcers or bleeding, this will look like black tarry stools Make sure you take your mobic with food Try not to take it daily, take AS needed Can take with pepcid  Will refer to physical therapy and do the mobic x 2 weeks and then stop If your back is not better in 2-4 weeks, we will get an MRI Negative straight leg raise  BACK PAIN  Try the exercises and other information in the back care manual.    Back pain Rehab Ask your health care provider which exercises are safe for you. Do exercises exactly as told by your health care provider and adjust them as directed. It is normal to feel mild stretching, pulling, tightness, or discomfort as you do these exercises, but you should stop right away if you feel sudden pain or your pain gets worse. Do not begin these exercises until told by your health care provider. Stretching and range of motion exercises These exercises warm up your muscles and joints and improve the movement and flexibility of your hips and your back. These exercises also help to relieve pain, numbness, and tingling. Exercise A: Sciatic nerve glide 1. Sit in a chair with your head facing down toward your chest. Place your hands behind your back. Let your shoulders slump forward. 2. Slowly straighten one of your knees while you tilt your head back as if you are looking toward the ceiling. Only straighten your leg as far as you can without making your symptoms worse. 3. Hold for __________ seconds. 4. Slowly return to  the starting position. 5. Repeat with your other leg. Repeat __________ times. Complete this exercise __________ times a day. Exercise B: Knee to chest with hip adduction and internal rotation  1. Lie on your back on a firm surface with both legs straight. 2. Bend one of your knees and move it up toward your chest until you feel a gentle stretch in your lower back and buttock. Then, move your knee toward the shoulder that is on the opposite side from your leg. ? Hold your leg in this position by holding onto the front of your knee. 3. Hold for __________ seconds. 4. Slowly return to the starting position. 5. Repeat with your other leg. Repeat __________ times. Complete this exercise __________ times a day. Exercise C: Prone extension on elbows  1. Lie on your abdomen on a firm surface. A bed may be too soft for this exercise. 2. Prop yourself up on your elbows. 3. Use your arms to help lift your chest up until you feel a gentle stretch in your abdomen and your lower back. ? This will place some of your body weight on your elbows. If this is uncomfortable, try stacking pillows under your chest. ? Your hips should stay down, against the surface that you are lying on. Keep your hip and back muscles relaxed. 4. Hold for __________ seconds. 5. Slowly relax your upper body and return to the starting position. Repeat __________ times. Complete  this exercise __________ times a day. Strengthening exercises These exercises build strength and endurance in your back. Endurance is the ability to use your muscles for a long time, even after they get tired. Exercise D: Pelvic tilt 1. Lie on your back on a firm surface. Bend your knees and keep your feet flat. 2. Tense your abdominal muscles. Tip your pelvis up toward the ceiling and flatten your lower back into the floor. ? To help with this exercise, you may place a small towel under your lower back and try to push your back into the towel. 3. Hold for  __________ seconds. 4. Let your muscles relax completely before you repeat this exercise. Repeat __________ times. Complete this exercise __________ times a day. Exercise E: Alternating arm and leg raises  1. Get on your hands and knees on a firm surface. If you are on a hard floor, you may want to use padding to cushion your knees, such as an exercise mat. 2. Line up your arms and legs. Your hands should be below your shoulders, and your knees should be below your hips. 3. Lift your left leg behind you. At the same time, raise your right arm and straighten it in front of you. ? Do not lift your leg higher than your hip. ? Do not lift your arm higher than your shoulder. ? Keep your abdominal and back muscles tight. ? Keep your hips facing the ground. ? Do not arch your back. ? Keep your balance carefully, and do not hold your breath. 4. Hold for __________ seconds. 5. Slowly return to the starting position and repeat with your right leg and your left arm. Repeat __________ times. Complete this exercise __________ times a day. Posture and body mechanics  Body mechanics refers to the movements and positions of your body while you do your daily activities. Posture is part of body mechanics. Good posture and healthy body mechanics can help to relieve stress in your body's tissues and joints. Good posture means that your spine is in its natural S-curve position (your spine is neutral), your shoulders are pulled back slightly, and your head is not tipped forward. The following are general guidelines for applying improved posture and body mechanics to your everyday activities. Standing   When standing, keep your spine neutral and your feet about hip-width apart. Keep a slight bend in your knees. Your ears, shoulders, and hips should line up.  When you do a task in which you stand in one place for a long time, place one foot up on a stable object that is 2-4 inches (5-10 cm) high, such as a  footstool. This helps keep your spine neutral. Sitting   When sitting, keep your spine neutral and keep your feet flat on the floor. Use a footrest, if necessary, and keep your thighs parallel to the floor. Avoid rounding your shoulders, and avoid tilting your head forward.  When working at a desk or a computer, keep your desk at a height where your hands are slightly lower than your elbows. Slide your chair under your desk so you are close enough to maintain good posture.  When working at a computer, place your monitor at a height where you are looking straight ahead and you do not have to tilt your head forward or downward to look at the screen. Resting   When lying down and resting, avoid positions that are most painful for you.  If you have pain with activities such as sitting, bending,  stooping, or squatting (flexion-based activities), lie in a position in which your body does not bend very much. For example, avoid curling up on your side with your arms and knees near your chest (fetal position).  If you have pain with activities such as standing for a long time or reaching with your arms (extension-based activities), lie with your spine in a neutral position and bend your knees slightly. Try the following positions: ? Lying on your side with a pillow between your knees. ? Lying on your back with a pillow under your knees. Lifting   When lifting objects, keep your feet at least shoulder-width apart and tighten your abdominal muscles.  Bend your knees and hips and keep your spine neutral. It is important to lift using the strength of your legs, not your back. Do not lock your knees straight out.  Always ask for help to lift heavy or awkward objects. This information is not intended to replace advice given to you by your health care provider. Make sure you discuss any questions you have with your health care provider. Document Released: 06/25/2005 Document Revised: 03/01/2016 Document  Reviewed: 03/11/2015 Elsevier Interactive Patient Education  2018 [employer].     Recombinant Zoster (Shingles) Vaccine, RZV: What You Need to Know 1. Why get vaccinated? Shingles (also called herpes zoster, or just zoster) is a painful skin rash, often with blisters. Shingles is caused by the varicella zoster virus, the same virus that causes chickenpox. After you have chickenpox, the virus stays in your body and can cause shingles later in life. You can't catch shingles from another person. However, a person who has never had chickenpox (or chickenpox vaccine) could get chickenpox from someone with shingles. A shingles rash usually appears on one side of the face or body and heals within 2 to 4 weeks. Its main symptom is pain, which can be severe. Other symptoms can include fever, headache, chills, and upset stomach. Very rarely, a shingles infection can lead to pneumonia, hearing problems, blindness, brain inflammation (encephalitis), or death. For about 1 person in 5, severe pain can continue even long after the rash has cleared up. This long-lasting pain is called post-herpetic neuralgia (PHN). Shingles is far more common in people 73 years of age and older than in younger people, and the risk increases with age. It is also more common in people whose immune system is weakened because of a disease such as cancer, or by drugs such as steroids or chemotherapy. At least 1 million people a year in the [place] get shingles. 2. Shingles vaccine (recombinant) Recombinant shingles vaccine was approved by FDA in 2017 for the prevention of shingles. In clinical trials, it was more than 90% effective in preventing shingles. It can also reduce the likelihood of PHN. Two doses, 2 to 6 months apart, are recommended for adults 50 and older. This vaccine is also recommended for people who have already gotten the live shingles vaccine (Zostavax). There is no live virus in this vaccine. 3. Some people  should not get this vaccine Tell your vaccine provider if you:  Have any severe, life-threatening allergies. A person who has ever had a life-threatening allergic reaction after a dose of recombinant shingles vaccine, or has a severe allergy to any component of this vaccine, may be advised not to be vaccinated. Ask your health care provider if you want information about vaccine components.  Are pregnant or breastfeeding. There is not much information about use of recombinant shingles vaccine in  pregnant or nursing women. Your healthcare provider might recommend delaying vaccination.  Are not feeling well. If you have a mild illness, such as a cold, you can probably get the vaccine today. If you are moderately or severely ill, you should probably wait until you recover. Your doctor can advise you. 4. Risks of a vaccine reaction With any medicine, including vaccines, there is a chance of reactions. After recombinant shingles vaccination, a person might experience:  Pain, redness, soreness, or swelling at the site of the injection  Headache, muscle aches, fever, shivering, fatigue In clinical trials, most people got a sore arm with mild or moderate pain after vaccination, and some also had redness and swelling where they got the shot. Some people felt tired, had muscle pain, a headache, shivering, fever, stomach pain, or nausea. About 1 out of 6 people who got recombinant zoster vaccine experienced side effects that prevented them from doing regular activities. Symptoms went away on their own in about 2 to 3 days. Side effects were more common in younger people. You should still get the second dose of recombinant zoster vaccine even if you had one of these reactions after the first dose. Other things that could happen after this vaccine:  People sometimes faint after medical procedures, including vaccination. Sitting or lying down for about 15 minutes can help prevent fainting and injuries caused by  a fall. Tell your provider if you feel dizzy or have vision changes or ringing in the ears.  Some people get shoulder pain that can be more severe and longer-lasting than routine soreness that can follow injections. This happens very rarely.  Any medication can cause a severe allergic reaction. Such reactions to a vaccine are estimated at about 1 in a million doses, and would happen within a few minutes to a few hours after the vaccination. As with any medicine, there is a very remote chance of a vaccine causing a serious injury or death. The safety of vaccines is always being monitored. For more information, visit: http://floyd.org/ 5. What if there is a serious problem? What should I look for?  Look for anything that concerns you, such as signs of a severe allergic reaction, very high fever, or unusual behavior. Signs of a severe allergic reaction can include hives, swelling of the face and throat, difficulty breathing, a fast heartbeat, dizziness, and weakness. These would usually start a few minutes to a few hours after the vaccination. What should I do?  If you think it is a severe allergic reaction or other emergency that can't wait, call 9-1-1 or get to the nearest hospital. Otherwise, call your health care provider. Afterward, the reaction should be reported to the Vaccine Adverse Event Reporting System (VAERS). Your doctor should file this report, or you can do it yourself through the VAERS website at www.vaers.LAgents.no, or by calling 1-862-397-9841. VAERS does not give medical advice. 6. How can I learn more?  Ask your health care provider. He or she can give you the vaccine package insert or suggest other sources of information.  Call your local or state health department.  Contact the Centers for Disease Control and Prevention (CDC): ? Call 310-518-7906 (1-800-CDC-INFO) or ? Visit CDC's vaccines website at PicCapture.uy CDC Vaccine Information Statement  Recombinant Zoster Vaccine (08/20/2016) This information is not intended to replace advice given to you by your health care provider. Make sure you discuss any questions you have with your health care provider. Document Released: 09/04/2016 Document Revised: 01/29/2018 Document Reviewed: 01/29/2018  Chartered certified accountant Patient Education  [employer].

## 2018-08-13 ENCOUNTER — Encounter: Payer: Self-pay | Admitting: Physical Therapy

## 2018-08-13 ENCOUNTER — Ambulatory Visit: Payer: PPO | Attending: Physician Assistant | Admitting: Physical Therapy

## 2018-08-13 ENCOUNTER — Other Ambulatory Visit: Payer: Self-pay

## 2018-08-13 DIAGNOSIS — G8929 Other chronic pain: Secondary | ICD-10-CM | POA: Diagnosis not present

## 2018-08-13 DIAGNOSIS — M545 Low back pain, unspecified: Secondary | ICD-10-CM

## 2018-08-13 NOTE — Therapy (Signed)
[place], [place], [address] Phone: [phone]   Fax:  [phone]  Physical Therapy Evaluation  Patient Details  Name: Brett Perez MRN: [id number] Date of Birth: [birth year] Referring Provider (PT): Vicie Mutters, [place]   Encounter Date: 08/13/2018  PT End of Session - 08/13/18 0853    Visit Number  1    Number of Visits  7    Date for PT Re-Evaluation  09/24/18    Authorization Type  MCR: Kx mod by 15th visit, progress note by 10th    PT Start Time  0849    PT Stop Time  0932    PT Time Calculation (min)  43 min    Activity Tolerance  Patient tolerated treatment well    Behavior During Therapy  [facility] for tasks assessed/performed       Past Medical History:  Diagnosis Date  . Hyperlipidemia   . Hypertension   . Other testicular hypofunction   . Vitamin D deficiency     Past Surgical History:  Procedure Laterality Date  . ANKLE SURGERY Left 1983   for bone spur  . APPENDECTOMY  1960  . KNEE ARTHROSCOPY Left 1986, 1987  . MOUTH SURGERY  2012   For implants  . PATELLAR TENDON REPAIR Right 11/2016   [facility]  . TONSILLECTOMY  1958    There were no vitals filed for this visit.   Subjective Assessment - 08/13/18 0852    Subjective  pt reports low back pain starting back in November, he is unsure of what caused the pain but thinks it could be due to focusing on strengthening of the R knee due to a patellar tendon repair. pain starts in the back and occurs mostly with standing with dull pain going down the medial aspect of the L thigh. Since onset the pain and LLE referral symptoms stays the same. no previous hx or low back pain. pt denies any red  flags.    Limitations  Standing;Walking    How long can you sit comfortably?  unlimited    How long can you stand comfortably?  various depending on discomfort    How long can you walk comfortably?  various depending on discomfort    Diagnostic  tests  N/A    Patient Stated Goals  understand whats going on, decrease pain, return to golf    Currently in Pain?  Yes    Pain Score  0-No pain   at worst 1-2/10   Pain Location  Back    Pain Orientation  Left    Pain Descriptors / Indicators  Aching;Pins and needles;Sore    Pain Type  Chronic pain    Pain Radiating Towards  along the medial aspect of the L thigh    Pain Onset  More than a month ago    Pain Frequency  Intermittent    Aggravating Factors   standing/ walking     Pain Relieving Factors  sitting and resting    Effect of Pain on Daily Activities  limited standing/ walking endurance.          [facility] PT Assessment - 08/13/18 0849      Assessment   Medical Diagnosis  Acute left-sided low back pain with left-sided sciatica     Referring Provider (PT)  Vicie Mutters, PA-C    Onset Date/Surgical Date  --   November   Hand Dominance  Right    Next MD Visit  --  March 2020   Prior Therapy  yes      Precautions   Precautions  None      Restrictions   Weight Bearing Restrictions  No      Balance Screen   Has the patient fallen in the past 6 months  No    Has the patient had a decrease in activity level because of a fear of falling?   No    Is the patient reluctant to leave their home because of a fear of falling?   No      Home Film/video editor residence    Living Arrangements  Spouse/significant other    Available Help at Discharge  Family    Type of [place] to enter    Entrance Stairs-Number of Steps  4    Entrance Stairs-Rails  None    Home Layout  Two level    Alternate Level Stairs-Number of Steps  15    Alternate Level Stairs-Rails  Can reach both    [place]      Prior Function   Level of Independence  Independent    Vocation  Full time employment   Biomedical scientist Requirements  sitting/ standing    Leisure  biking, reading, hiking, fishing, golf      Cognition   Overall  Cognitive Status  Within Functional Limits for tasks assessed      Observation/Other Assessments   Focus on Therapeutic Outcomes (FOTO)   33% limited   predicted 28% limited     Posture/Postural Control   Posture/Postural Control  Postural limitations    Postural Limitations  Rounded Shoulders;Forward head;Decreased lumbar lordosis      ROM / Strength   AROM / PROM / Strength  AROM;Strength      AROM   AROM Assessment Site  Lumbar    Lumbar Flexion  100    Lumbar Extension  28    Lumbar - Right Side Bend  18    Lumbar - Left Side Bend  20      Strength   Strength Assessment Site  Hip;Knee    Right/Left Hip  Right;Left    Right Hip Flexion  5/5    Right Hip Extension  5/5    Left Hip Flexion  5/5    Left Hip Extension  5/5    Right/Left Knee  Right;Left    Right Knee Flexion  5/5    Right Knee Extension  5/5    Left Knee Flexion  5/5    Left Knee Extension  5/5      Palpation   SI assessment   potential posterior rotation on the L    Palpation comment  TTP along the L PSIS/ SIJ. proximal hamstring tension, and piriformis      Special Tests    Special Tests  Lumbar;Sacrolliac Tests    Lumbar Tests  Prone Knee Bend Test;Slump Test;Straight Leg Raise    Sacroiliac Tests   Sacral Thrust   postive thigh thrust     Slump test   Findings  Negative      Prone Knee Bend Test   Findings  Positive    Comment  trunk rotation due to increaesd tightness along hip flexors      Straight Leg Raise   Findings  Negative      Sacral thrust    Findings  Negative  Ambulation/Gait   Ambulation/Gait  Yes    Gait Pattern  Within Functional Limits                Objective measurements completed on examination: See above findings.      [facility] Adult PT Treatment/Exercise - 08/13/18 0849      Lumbar Exercises: Stretches   Active Hamstring Stretch  3 reps;Left;30 seconds   contract/ relax with 10 sec hold     Knee/Hip Exercises: Supine   Single Leg Bridge  1  set;10 reps             PT Education - 08/13/18 1240    Education Details  evaluation findings, POC, goals, HEP with proper form/ rationale. anatomy of the SIJ and effects of the muscles.     Person(s) Educated  Patient    Methods  Explanation;Verbal cues;Handout    Comprehension  Verbalized understanding;Verbal cues required       PT Short Term Goals - 08/13/18 1242      PT SHORT TERM GOAL #1   Title  pt to be I with inital HEP    Time  3    Period  Weeks    Status  New    Target Date  09/03/18        PT Long Term Goals - 08/13/18 1242      PT LONG TERM GOAL #1   Title  pt to report decrease low back pain to </= 1/10 for QOL    Time  6    Period  Weeks    Status  New    Target Date  09/24/18      PT LONG TERM GOAL #2   Title  pt to verbalize/ demo proper posture and lifting mechanics to reduce and prevent low back pain     Time  6    Period  Weeks    Status  New    Target Date  09/24/18      PT LONG TERM GOAL #3   Title  pt to be able to return to gym exercise, and golfing per pt's personal goals    Time  6    Period  Weeks    Status  New    Target Date  09/24/18      PT LONG TERM GOAL #4   Title  pt to be able stand/ walk >/= 60 min with </= 1/10 pain for functional mobility required for ADLS     Time  6    Period  Weeks    Status  New    Target Date  09/24/18      PT LONG TERM GOAL #5   Title  increase FOTO score to </= 28% limited to demo improvement in function     Time  6    Period  Weeks    Status  New    Target Date  09/24/18             Plan - 08/13/18 1101    Clinical Impression Statement  pt present to OPPT with CC or L low back pain with insidious onset beginning in November. He exhibits functional trunk mobility and bil Le strength. TTP along the L PSIS with postive findings suggesting  posterior innominate rotation on the L. he would benefit from physical therapy to decrease low back pain, improve standing/ walking endurance,  and return to PLOF by addressing the deficits listed.     Clinical Presentation  Stable  Clinical Decision Making  Low    Rehab Potential  Good    PT Frequency  1x / week    PT Duration  6 weeks    PT Treatment/Interventions  ADLs/Self Care Home Management;Cryotherapy;Electrical Stimulation;Iontophoresis 27m/ml Dexamethasone;Moist Heat;Traction;Therapeutic exercise;Therapeutic activities;Gait training;Dry needling;Taping;Manual techniques;Patient/family education;Passive range of motion    PT Next Visit Plan  review/ update HEP, potential L SIJ posterior rotation, hamstring stretching, hip flexor MET, core strengthening    PT Home Exercise Plan  hamstring stretching, SLR, lower trunk rotation, seated pelvic tilt    Consulted and Agree with Plan of Care  Patient       Patient will benefit from skilled therapeutic intervention in order to improve the following deficits and impairments:  Increased fascial restricitons, Pain, Postural dysfunction, Improper body mechanics, Decreased endurance, Decreased activity tolerance  Visit Diagnosis: Chronic bilateral low back pain, unspecified whether sciatica present     Problem List Patient Active Problem List   Diagnosis Date Noted  . Encounter for Medicare annual wellness exam 06/14/2016  . Other abnormal glucose 08/13/2013  . Medication management 08/13/2013  . Hyperlipidemia   . Hypertension   . Vitamin D deficiency   . Testosterone deficiency    KStarr LakePT, DPT, LAT, ATC  08/13/18  12:48 PM      [facility]GChestertown [place] [id number]: [phone]  Fax:  [phone] Name: RGREGOR DERSHEMMRN: [id number] of Birth: [birth year]

## 2018-08-20 ENCOUNTER — Ambulatory Visit: Payer: PPO | Admitting: Physical Therapy

## 2018-08-20 ENCOUNTER — Encounter: Payer: Self-pay | Admitting: Physical Therapy

## 2018-08-20 DIAGNOSIS — G8929 Other chronic pain: Secondary | ICD-10-CM

## 2018-08-20 DIAGNOSIS — M545 Low back pain: Principal | ICD-10-CM

## 2018-08-20 NOTE — Therapy (Signed)
[facility] [address] [place], [place], [address] Phone: [phone]   Fax:  [phone]  Physical Therapy Treatment  Patient Details  Name: Brett Perez MRN: [id number] Date of Birth: 1951/01/11 Referring Provider (PT): Quentin Mulling, [place]   Encounter Date: 08/20/2018  PT End of Session - 08/20/18 0857    Visit Number  2    Number of Visits  7    Date for PT Re-Evaluation  09/24/18    Authorization Type  MCR: Kx mod by 15th visit, progress note by 10th    PT Start Time  0851    PT Stop Time  0930    PT Time Calculation (min)  39 min       Past Medical History:  Diagnosis Date  . Hyperlipidemia   . Hypertension   . Other testicular hypofunction   . Vitamin D deficiency     Past Surgical History:  Procedure Laterality Date  . ANKLE SURGERY Left 1983   for bone spur  . APPENDECTOMY  1960  . KNEE ARTHROSCOPY Left 1986, 1987  . MOUTH SURGERY  2012   For implants  . PATELLAR TENDON REPAIR Right 11/2016   [facility]  . TONSILLECTOMY  1958    There were no vitals filed for this visit.  Subjective Assessment - 08/20/18 0854    Subjective  Had some moring N/T in anterior medial thigh left.     Currently in Pain?  No/denies                       [facility] Adult PT Treatment/Exercise - 08/20/18 0001      Lumbar Exercises: Stretches   Active Hamstring Stretch  3 reps;Left;30 seconds;Right      Lumbar Exercises: Aerobic   Nustep  L5 x 5 minutes UE/LE       Lumbar Exercises: Supine   Pelvic Tilt  10 reps    Bent Knee Raise  20 reps    Dead Bug  20 reps    Straight Leg Raise  20 reps    Straight Leg Raises Limitations  with ab draw in               PT Short Term Goals - 08/13/18 1242      PT SHORT TERM GOAL #1   Title  pt to be I with inital HEP    Time  3    Period  Weeks    Status  New    Target Date  09/03/18        PT Long Term Goals - 08/13/18 1242      PT LONG TERM  GOAL #1   Title  pt to report decrease low back pain to </= 1/10 for QOL    Time  6    Period  Weeks    Status  New    Target Date  09/24/18      PT LONG TERM GOAL #2   Title  pt to verbalize/ demo proper posture and lifting mechanics to reduce and prevent low back pain     Time  6    Period  Weeks    Status  New    Target Date  09/24/18      PT LONG TERM GOAL #3   Title  pt to be able to return to gym exercise, and golfing per pt's personal goals    Time  6    Period  Weeks  Status  New    Target Date  09/24/18      PT LONG TERM GOAL #4   Title  pt to be able stand/ walk >/= 60 min with </= 1/10 pain for functional mobility required for ADLS     Time  6    Period  Weeks    Status  New    Target Date  09/24/18      PT LONG TERM GOAL #5   Title  increase FOTO score to </= 28% limited to demo improvement in function     Time  6    Period  Weeks    Status  New    Target Date  09/24/18            Plan - 08/20/18 0956    Clinical Impression Statement  Pt arrives reporting improvement. He has performed SLR and hamstring srretch bilateraly. He appears to have a level pelvis today so I encouraged him to continue performing bialterally. Advanced core to deadbug and updated HEP.     PT Home Exercise Plan  hamstring stretching, SLR, lower trunk rotation, seated pelvic tilt, dead bug, (he is doing supine pelvic tilits and bird dogs per MD HEP)        Patient will benefit from skilled therapeutic intervention in order to improve the following deficits and impairments:  Increased fascial restricitons, Pain, Postural dysfunction, Improper body mechanics, Decreased endurance, Decreased activity tolerance  Visit Diagnosis: Chronic bilateral low back pain, unspecified whether sciatica present     Problem List Patient Active Problem List   Diagnosis Date Noted  . Encounter for Medicare annual wellness exam 06/14/2016  . Other abnormal glucose 08/13/2013  . Medication  management 08/13/2013  . Hyperlipidemia   . Hypertension   . Vitamin D deficiency   . Testosterone deficiency     Sherrie MustacheDonoho, Liandro Thelin McGee, [place]PTA 08/20/2018, 9:59 AM  [facility] [address] [place], [place], [address] Phone: [phone]   Fax:  [phone]  Name: Brett Perez MRN: [id number] Date of Birth: 06/03/1951

## 2018-08-25 ENCOUNTER — Ambulatory Visit: Payer: PPO | Admitting: Physical Therapy

## 2018-08-25 ENCOUNTER — Encounter: Payer: Self-pay | Admitting: Physical Therapy

## 2018-08-25 DIAGNOSIS — M545 Low back pain: Secondary | ICD-10-CM | POA: Diagnosis not present

## 2018-08-25 DIAGNOSIS — G8929 Other chronic pain: Secondary | ICD-10-CM

## 2018-08-25 NOTE — Therapy (Signed)
[place], [place], [address] Phone: [phone]   Fax:  [phone]  Physical Therapy Treatment  Patient Details  Name: Brett Perez MRN: [id number] Date of Birth: Nov 27, 1950 Referring Provider (PT): Vicie Mutters, [place]   Encounter Date: 08/25/2018  PT End of Session - 08/25/18 0821    Visit Number  3    Number of Visits  7    Date for PT Re-Evaluation  09/24/18    Authorization Type  MCR: Kx mod by 15th visit, progress note by 10th    PT Start Time  0802    PT Stop Time  0842    PT Time Calculation (min)  40 min       Past Medical History:  Diagnosis Date  . Hyperlipidemia   . Hypertension   . Other testicular hypofunction   . Vitamin D deficiency     Past Surgical History:  Procedure Laterality Date  . ANKLE SURGERY Left 1983   for bone spur  . APPENDECTOMY  1960  . KNEE ARTHROSCOPY Left 1986, 1987  . MOUTH SURGERY  2012   For implants  . PATELLAR TENDON REPAIR Right 11/2016   [facility]  . TONSILLECTOMY  1958    There were no vitals filed for this visit.  Subjective Assessment - 08/25/18 0805    Subjective  Maybe a little less N/T in the mornings. I think Pt maybe helping a little.     Currently in Pain?  No/denies                       [facility] Adult PT Treatment/Exercise - 08/25/18 0001      Lumbar Exercises: Stretches   Single Knee to Chest Stretch  3 reps;20 seconds    Lower Trunk Rotation  5 reps;10 seconds      Lumbar Exercises: Standing   Row  20 reps;Theraband    Theraband Level (Row)  Level 4 (Blue)    Shoulder Extension  20 reps;Theraband    Theraband Level (Shoulder Extension)  Level 4 (Blue)    Other Standing Lumbar Exercises  squat to table-hip hinge x 10     Other Standing Lumbar Exercises  green band D2 flexion  x 10  each side       Lumbar Exercises: Supine   Pelvic Tilt  10 reps    Bent Knee Raise  20 reps    Bent Knee Raise Limitations   progressed to one foot at a time into table top and back down.     Dead Bug  20 reps    Bridge  10 reps      Lumbar Exercises: Quadruped   Madcat/Old Horse  10 reps    Opposite Arm/Leg Raise  10 reps             PT Education - 08/25/18 0840    Education Details  HEP    Person(s) Educated  Patient    Methods  Explanation;Handout    Comprehension  Verbalized understanding       PT Short Term Goals - 08/13/18 1242      PT SHORT TERM GOAL #1   Title  pt to be I with inital HEP    Time  3    Period  Weeks    Status  New    Target Date  09/03/18        PT Long Term Goals - 08/13/18 1242  PT LONG TERM GOAL #1   Title  pt to report decrease low back pain to </= 1/10 for QOL    Time  6    Period  Weeks    Status  New    Target Date  09/24/18      PT LONG TERM GOAL #2   Title  pt to verbalize/ demo proper posture and lifting mechanics to reduce and prevent low back pain     Time  6    Period  Weeks    Status  New    Target Date  09/24/18      PT LONG TERM GOAL #3   Title  pt to be able to return to gym exercise, and golfing per pt's personal goals    Time  6    Period  Weeks    Status  New    Target Date  09/24/18      PT LONG TERM GOAL #4   Title  pt to be able stand/ walk >/= 60 min with </= 1/10 pain for functional mobility required for ADLS     Time  6    Period  Weeks    Status  New    Target Date  09/24/18      PT LONG TERM GOAL #5   Title  increase FOTO score to </= 28% limited to demo improvement in function     Time  6    Period  Weeks    Status  New    Target Date  09/24/18            Plan - 08/25/18 0840    Clinical Impression Statement  Pt reports less N/T pain in the mornings. He went to a basketball game this weekend and noted no limitations from pain. Progressed to table tops and standing core. Some tightness reported after standing core so ended with stretches and he felt looser.     PT Next Visit Plan  review/ update HEP,  potential L SIJ posterior rotation, hamstring stretching, hip flexor MET, core strengthening    PT Home Exercise Plan  hamstring stretching, SLR, lower trunk rotation, seated pelvic tilt, dead bug, standing row and shoulder extension blue (he is doing supine pelvic tilits and bird dogs per MD HEP)     Consulted and Agree with Plan of Care  Patient       Patient will benefit from skilled therapeutic intervention in order to improve the following deficits and impairments:  Increased fascial restricitons, Pain, Postural dysfunction, Improper body mechanics, Decreased endurance, Decreased activity tolerance  Visit Diagnosis: Chronic bilateral low back pain, unspecified whether sciatica present     Problem List Patient Active Problem List   Diagnosis Date Noted  . Encounter for Medicare annual wellness exam 06/14/2016  . Other abnormal glucose 08/13/2013  . Medication management 08/13/2013  . Hyperlipidemia   . Hypertension   . Vitamin D deficiency   . Testosterone deficiency     Dorene Ar, [place] 08/25/2018, 9:51 AM  [facility] [address] [place], [place], [address] Phone: [phone]   Fax:  [phone]  Name: VAIBHAV FOGLEMAN MRN: [id number] Date of Birth: 1950-09-27

## 2018-09-04 ENCOUNTER — Encounter: Payer: Self-pay | Admitting: Physical Therapy

## 2018-09-04 ENCOUNTER — Ambulatory Visit: Payer: PPO | Admitting: Physical Therapy

## 2018-09-04 DIAGNOSIS — M545 Low back pain, unspecified: Secondary | ICD-10-CM

## 2018-09-04 DIAGNOSIS — G8929 Other chronic pain: Secondary | ICD-10-CM

## 2018-09-04 NOTE — Therapy (Signed)
[place], [place], [address] Phone: [phone]   Fax:  [phone]  Physical Therapy Treatment / Discharge summary  Patient Details  Name: Brett Perez MRN: [id number] Date of Birth: 1950/07/14 Referring Provider (PT): Vicie Mutters, [place]   Encounter Date: 09/04/2018  PT End of Session - 09/04/18 0845    Visit Number  4    Number of Visits  7    Date for PT Re-Evaluation  09/24/18    Authorization Type  MCR: Kx mod by 15th visit, progress note by 10th    PT Start Time  0845    PT Stop Time  0918    PT Time Calculation (min)  33 min    Activity Tolerance  Patient tolerated treatment well    Behavior During Therapy  [facility] for tasks assessed/performed       Past Medical History:  Diagnosis Date  . Hyperlipidemia   . Hypertension   . Other testicular hypofunction   . Vitamin D deficiency     Past Surgical History:  Procedure Laterality Date  . ANKLE SURGERY Left 1983   for bone spur  . APPENDECTOMY  1960  . KNEE ARTHROSCOPY Left 1986, 1987  . MOUTH SURGERY  2012   For implants  . PATELLAR TENDON REPAIR Right 11/2016   [facility]  . TONSILLECTOMY  1958    There were no vitals filed for this visit.  Subjective Assessment - 09/04/18 0842    Subjective  "I still have some mitigated soreness, minimal N/T in the morning'    Patient Stated Goals  understand whats going on, decrease pain, return to golf    Currently in Pain?  No/denies    Pain Score  0-No pain    Pain Onset  More than a month ago    Pain Frequency  Intermittent         [facility] PT Assessment - 09/04/18 0852      Assessment   Medical Diagnosis  Acute left-sided low back pain with left-sided sciatica     Referring Provider (PT)  Vicie Mutters, PA-C      Observation/Other Assessments   Focus on Therapeutic Outcomes (FOTO)   24% limited      AROM   Lumbar Flexion  100    Lumbar Extension  30    Lumbar - Right Side Bend   20    Lumbar - Left Side Bend  20                   [facility] Adult PT Treatment/Exercise - 09/04/18 0001      Self-Care   Self-Care  Posture;Other Self-Care Comments    Posture  posture and lifting mechanics to maintain proper spinal curves    Other Self-Care Comments   gradual progression at the gym focusing on form and gradually increase resistance. same with golf working on swing / mechanics starting with 50% of swing.             PT Education - 09/04/18 0919    Education Details  reviewed prevously provided HEP and progression of overall function. discussed benefits of continued strengthening to promote endurance gradually increasing reps/ resistance. reviewed posture and lifting mechanics.     Person(s) Educated  Patient    Methods  Explanation;Verbal cues    Comprehension  Verbalized understanding;Verbal cues required       PT Short Term Goals - 09/04/18 0907      PT SHORT  TERM GOAL #1   Title  pt to be I with inital HEP    Time  3    Period  Weeks    Status  Deferred        PT Long Term Goals - 09/04/18 0908      PT LONG TERM GOAL #1   Title  pt to report decrease low back pain to </= 1/10 for QOL    Time  6    Period  Weeks    Status  Achieved      PT LONG TERM GOAL #2   Title  pt to verbalize/ demo proper posture and lifting mechanics to reduce and prevent low back pain     Time  6    Period  Weeks    Status  Achieved      PT LONG TERM GOAL #3   Title  pt to be able to return to gym exercise, and golfing per pt's personal goals    Baseline  haven't returned to golf (due to weather)    Time  6    Status  Partially Met      PT LONG TERM GOAL #4   Title  pt to be able stand/ walk >/= 60 min with </= 1/10 pain for functional mobility required for ADLS     Period  Weeks    Status  Achieved      PT LONG TERM GOAL #5   Title  increase FOTO score to </= 28% limited to demo improvement in function     Time  6    Period  Weeks    Status   Achieved            Plan - 09/04/18 9326    Clinical Impression Statement  Brett Perez has made great progress with physical therapy maintaining trunk mobility and additionally reports no pain. He met or partially met all goals today and is able to maintain and progress current level of function and will be discharged from PT today.     PT Next Visit Plan  D/C    PT Home Exercise Plan  hamstring stretching, SLR, lower trunk rotation, seated pelvic tilt, dead bug, standing row and shoulder extension blue (he is doing supine pelvic tilits and bird dogs per MD HEP)     Consulted and Agree with Plan of Care  Patient       Patient will benefit from skilled therapeutic intervention in order to improve the following deficits and impairments:     Visit Diagnosis: Chronic bilateral low back pain, unspecified whether sciatica present     Problem List Patient Active Problem List   Diagnosis Date Noted  . Encounter for Medicare annual wellness exam 06/14/2016  . Other abnormal glucose 08/13/2013  . Medication management 08/13/2013  . Hyperlipidemia   . Hypertension   . Vitamin D deficiency   . Testosterone deficiency     Brett Perez 09/04/2018, 9:24 AM  [facility] [address] [place], [place], [address] Phone: [phone]   Fax:  [phone]  Name: Brett Perez MRN: [id number] Date of Birth: [birth year]      PHYSICAL THERAPY DISCHARGE SUMMARY  Visits from Start of Care: 4  Current functional level related to goals / functional outcomes: See goals, FOTO 22% limited   Remaining deficits: Intermittent N/T in the thigh noted only getting out of bed in the AM.    Education / Equipment: HEP, theraband, posture/ lifting  mechanics  Plan: Patient agrees to discharge.  Patient goals were met. Patient is being discharged due to being pleased with the current functional level.  ?????    Brett Perez PT, DPT,  LAT, ATC  09/04/18  9:25 AM

## 2018-09-06 ENCOUNTER — Encounter: Payer: Self-pay | Admitting: Gastroenterology

## 2018-09-10 ENCOUNTER — Encounter: Payer: PPO | Admitting: Physical Therapy

## 2018-09-18 ENCOUNTER — Other Ambulatory Visit: Payer: Self-pay | Admitting: Internal Medicine

## 2018-10-06 ENCOUNTER — Ambulatory Visit: Payer: Self-pay | Admitting: Internal Medicine

## 2018-10-21 ENCOUNTER — Encounter: Payer: Self-pay | Admitting: Internal Medicine

## 2018-10-21 DIAGNOSIS — F17290 Nicotine dependence, other tobacco product, uncomplicated: Secondary | ICD-10-CM | POA: Insufficient documentation

## 2018-10-21 DIAGNOSIS — N401 Enlarged prostate with lower urinary tract symptoms: Secondary | ICD-10-CM

## 2018-10-21 DIAGNOSIS — I700 Atherosclerosis of aorta: Secondary | ICD-10-CM | POA: Insufficient documentation

## 2018-10-21 DIAGNOSIS — R7303 Prediabetes: Secondary | ICD-10-CM | POA: Insufficient documentation

## 2018-10-21 DIAGNOSIS — N138 Other obstructive and reflux uropathy: Secondary | ICD-10-CM | POA: Insufficient documentation

## 2018-10-21 DIAGNOSIS — Z8249 Family history of ischemic heart disease and other diseases of the circulatory system: Secondary | ICD-10-CM | POA: Insufficient documentation

## 2018-10-21 DIAGNOSIS — F17200 Nicotine dependence, unspecified, uncomplicated: Secondary | ICD-10-CM | POA: Insufficient documentation

## 2018-10-21 NOTE — Progress Notes (Signed)
History of Present Illness:      This very nice 68 y.o.  MWMpresents for 6 month follow up with HTN, HLD, Pre-Diabetes and Vitamin D Deficiency.       Patient is treated for HTN (2006) & BP has been controlled at home. Today's BP is at goal - 118/80. Patient has had no complaints of any cardiac type chest pain, palpitations, dyspnea / orthopnea / PND, dizziness, claudication, or dependent edema.      Hyperlipidemia is controlled with diet & Rosuvastatin. Patient denies myalgias or other med SE's. Last Lipids were at goal: Lab Results  Component Value Date   CHOL 184 03/27/2018   HDL 78 03/27/2018   LDLCALC 80 03/27/2018   TRIG 154 (H) 03/27/2018   CHOLHDL 2.4 03/27/2018        Also, the patient is monitored proactively for glucose intolerance and has had no symptoms of reactive hypoglycemia, diabetic polys, paresthesias or visual blurring.  Last A1c was Normal & at goal: Lab Results  Component Value Date   HGBA1C 5.1 03/27/2018       Patient is on treatment with Androgel for hx/o Low T ("228.87/2013) with improved stamina & sense of well being.        Further, the patient also has history of Vitamin D Deficiency("28 "/2008)  and supplements vitamin D without any suspected side-effects. Last vitamin D was at goal: Lab Results  Component Value Date   VD25OH 71 03/27/2018   Current Outpatient Medications on File Prior to Visit  Medication Sig  . aspirin 81 MG tablet Take 81 mg by mouth daily.   Marland Kitchen atenolol (TENORMIN) 100 MG tablet TAKE 1 TABLET BY MOUTH EVERY DAY  . Cholecalciferol (VITAMIN D PO) Take by mouth. Takes 4000 units daily  . Multiple Vitamin (MULTIVITAMIN) tablet Take 1 tablet by mouth daily.  . Omega-3 Fatty Acids (FISH OIL) 1000 MG CAPS Take by mouth daily.  . rosuvastatin (CRESTOR) 10 MG tablet Take 1 tablet (10 mg total) by mouth daily.  . Testosterone 20.25 MG/ACT (1.62%) GEL APPLY 2 PUMPS DAILY (1 PUMP TO EACH ARM)  . vitamin C (ASCORBIC ACID) 500 MG tablet  Take 500 mg by mouth daily.   No current facility-administered medications on file prior to visit.    No Known Allergies   PMHx:   Past Medical History:  Diagnosis Date  . Hyperlipidemia   . Hypertension   . Other testicular hypofunction   . Vitamin D deficiency    Immunization History  Administered Date(s) Administered  . Influenza, High Dose Seasonal PF 03/12/2017, 03/27/2018  . Influenza, Seasonal, Injecte, Preservative Fre 06/21/2015  . Influenza-Unspecified 07/01/2014  . PPD Test 08/13/2014, 08/30/2015  . Pneumococcal Conjugate-13 08/13/2014  . Pneumococcal Polysaccharide-23 06/14/2016  . Pneumococcal-Unspecified 07/09/1997  . Td 07/09/2004  . Tdap 08/13/2014  . Zoster 06/29/2014   Past Surgical History:  Procedure Laterality Date  . ANKLE SURGERY Left 1983   for bone spur  . APPENDECTOMY  1960  . KNEE ARTHROSCOPY Left 1986, 1987  . MOUTH SURGERY  2012   For implants  . PATELLAR TENDON REPAIR Right 11/2016   [facility]  . TONSILLECTOMY  1958   FHx:    Reviewed / unchanged SHx:    Reviewed / unchanged   Systems Review:  Constitutional: Denies fever, chills, wt changes, headaches, insomnia, fatigue, night sweats, change in appetite. Eyes: Denies redness, blurred vision, diplopia, discharge, itchy, watery eyes.  ENT: Denies discharge, congestion, post nasal drip, epistaxis, sore  throat, earache, hearing loss, dental pain, tinnitus, vertigo, sinus pain, snoring.  CV: Denies chest pain, palpitations, irregular heartbeat, syncope, dyspnea, diaphoresis, orthopnea, PND, claudication or edema. Respiratory: denies cough, dyspnea, DOE, pleurisy, hoarseness, laryngitis, wheezing.  Gastrointestinal: Denies dysphagia, odynophagia, heartburn, reflux, water brash, abdominal pain or cramps, nausea, vomiting, bloating, diarrhea, constipation, hematemesis, melena, hematochezia  or hemorrhoids. Genitourinary: Denies dysuria, frequency, urgency, nocturia, hesitancy, discharge, hematuria  or flank pain. Musculoskeletal: Denies arthralgias, myalgias, stiffness, jt. swelling, pain, limping or strain/sprain.  Skin: Denies pruritus, rash, hives, warts, acne, eczema or change in skin lesion(s). Neuro: No weakness, tremor, incoordination, spasms, paresthesia or pain. Psychiatric: Denies confusion, memory loss or sensory loss. Endo: Denies change in weight, skin or hair change.  Heme/Lymph: No excessive bleeding, bruising or enlarged lymph nodes.  Physical Exam  BP 118/80   Pulse (!) 52   Temp (!) 97.1 F (36.2 C)   Resp 16   Ht 6\' 1"  (1.854 m)   Wt 204 lb 9.6 oz (92.8 kg)   BMI 26.99 kg/m   Appears  well nourished, well groomed  and in no distress.  Eyes: PERRLA, EOMs, conjunctiva no swelling or erythema. Sinuses: No frontal/maxillary tenderness ENT/Mouth: EAC's clear, TM's nl w/o erythema, bulging. Nares clear w/o erythema, swelling, exudates. Oropharynx clear without erythema or exudates. Oral hygiene is good. Tongue normal, non obstructing. Hearing intact.  Neck: Supple. Thyroid not palpable. Car 2+/2+ without bruits, nodes or JVD. Chest: Respirations nl with BS clear & equal w/o rales, rhonchi, wheezing or stridor.  Cor: Heart sounds normal w/ regular rate and rhythm without sig. murmurs, gallops, clicks or rubs. Peripheral pulses normal and equal  without edema.  Abdomen: Soft & bowel sounds normal. Non-tender w/o guarding, rebound, hernias, masses or organomegaly.  Lymphatics: Unremarkable.  Musculoskeletal: Full ROM all peripheral extremities, joint stability, 5/5 strength and normal gait.  Skin: Warm, dry without exposed rashes, lesions or ecchymosis apparent.  Neuro: Cranial nerves intact, reflexes equal bilaterally. Sensory-motor testing grossly intact. Tendon reflexes grossly intact.  Pysch: Alert & oriented x 3.  Insight and judgement nl & appropriate. No ideations.  Assessment and Plan:  1. Essential hypertension  - Continue medication, monitor blood  pressure at home.  - Continue DASH diet.  Reminder to go to the ER if any CP,  SOB, nausea, dizziness, severe HA, changes vision/speech.  - CBC with Differential/Platelet - COMPLETE METABOLIC PANEL WITH GFR - Magnesium - TSH  2. Hyperlipidemia, mixed  - Continue diet/meds, exercise,& lifestyle modifications.  - Continue monitor periodic cholesterol/liver & renal functions   - Lipid panel - TSH  3. Abnormal glucose  - Continue diet, exercise  - Lifestyle modifications.  - Monitor appropriate labs.  - Hemoglobin A1c - Insulin, random  4. Vitamin D deficiency  - Continue supplementation.   - VITAMIN D 25 Hydroxyl  5. Prediabetes  - Hemoglobin A1c - Insulin, random  6. Medication management - CBC with Differential/Platelet - COMPLETE METABOLIC PANEL WITH GFR - Magnesium - Lipid panel - TSH - Hemoglobin A1c - Insulin, random - VITAMIN D 25 Hydroxy         Discussed  regular exercise, BP monitoring, weight control to achieve/maintain BMI less than 25 and discussed med and SE's. Recommended labs to assess and monitor clinical status with further disposition pending results of labs. I discussed the assessment and treatment plan with the patient. The patient was provided an opportunity to ask questions and all were answered. The patient agreed with the plan and demonstrated  an understanding of the instructions.  Over 30 minutes of exam, counseling, chart review was performed.  Marinus MawWilliam D Markcus Lazenby, MD

## 2018-10-21 NOTE — Patient Instructions (Signed)

## 2018-10-22 ENCOUNTER — Ambulatory Visit (INDEPENDENT_AMBULATORY_CARE_PROVIDER_SITE_OTHER): Payer: PPO | Admitting: Internal Medicine

## 2018-10-22 ENCOUNTER — Ambulatory Visit: Payer: Self-pay | Admitting: Internal Medicine

## 2018-10-22 ENCOUNTER — Other Ambulatory Visit: Payer: Self-pay

## 2018-10-22 VITALS — BP 118/80 | HR 52 | Temp 97.1°F | Resp 16 | Ht 73.0 in | Wt 204.6 lb

## 2018-10-22 DIAGNOSIS — Z79899 Other long term (current) drug therapy: Secondary | ICD-10-CM

## 2018-10-22 DIAGNOSIS — R7309 Other abnormal glucose: Secondary | ICD-10-CM | POA: Diagnosis not present

## 2018-10-22 DIAGNOSIS — I10 Essential (primary) hypertension: Secondary | ICD-10-CM

## 2018-10-22 DIAGNOSIS — E782 Mixed hyperlipidemia: Secondary | ICD-10-CM

## 2018-10-22 DIAGNOSIS — R7303 Prediabetes: Secondary | ICD-10-CM

## 2018-10-22 DIAGNOSIS — E559 Vitamin D deficiency, unspecified: Secondary | ICD-10-CM

## 2018-10-23 LAB — TSH: TSH: 1.78 mIU/L (ref 0.40–4.50)

## 2018-10-23 LAB — COMPLETE METABOLIC PANEL WITH GFR
AG Ratio: 2 (calc) (ref 1.0–2.5)
ALT: 34 U/L (ref 9–46)
AST: 28 U/L (ref 10–35)
Albumin: 4.5 g/dL (ref 3.6–5.1)
Alkaline phosphatase (APISO): 50 U/L (ref 35–144)
BUN: 12 mg/dL (ref 7–25)
CO2: 28 mmol/L (ref 20–32)
Calcium: 9.4 mg/dL (ref 8.6–10.3)
Chloride: 104 mmol/L (ref 98–110)
Creat: 0.99 mg/dL (ref 0.70–1.25)
GFR, Est African American: 91 mL/min/{1.73_m2} (ref >=60)
GFR, Est Non African American: 78 mL/min/{1.73_m2} (ref >=60)
Globulin: 2.2 g/dL (calc) (ref 1.9–3.7)
Glucose, Bld: 86 mg/dL (ref 65–99)
Potassium: 4.1 mmol/L (ref 3.5–5.3)
Sodium: 140 mmol/L (ref 135–146)
Total Bilirubin: 0.9 mg/dL (ref 0.2–1.2)
Total Protein: 6.7 g/dL (ref 6.1–8.1)

## 2018-10-23 LAB — CBC WITH DIFFERENTIAL/PLATELET
Absolute Monocytes: 493 cells/uL (ref 200–950)
Basophils Absolute: 28 cells/uL (ref 0–200)
Basophils Relative: 0.5 %
Eosinophils Absolute: 129 cells/uL (ref 15–500)
Eosinophils Relative: 2.3 %
HCT: 45.1 % (ref 38.5–50.0)
Hemoglobin: 15.7 g/dL (ref 13.2–17.1)
Lymphs Abs: 2890 cells/uL (ref 850–3900)
MCH: 32.1 pg (ref 27.0–33.0)
MCHC: 34.8 g/dL (ref 32.0–36.0)
MCV: 92.2 fL (ref 80.0–100.0)
MPV: 9.6 fL (ref 7.5–12.5)
Monocytes Relative: 8.8 %
Neutro Abs: 2061 cells/uL (ref 1500–7800)
Neutrophils Relative %: 36.8 %
Platelets: 265 10*3/uL (ref 140–400)
RBC: 4.89 10*6/uL (ref 4.20–5.80)
RDW: 13.1 % (ref 11.0–15.0)
Total Lymphocyte: 51.6 %
WBC: 5.6 10*3/uL (ref 3.8–10.8)

## 2018-10-23 LAB — VITAMIN D 25 HYDROXY (VIT D DEFICIENCY, FRACTURES): Vit D, 25-Hydroxy: 60 ng/mL (ref 30–100)

## 2018-10-23 LAB — MAGNESIUM: Magnesium: 2.2 mg/dL (ref 1.5–2.5)

## 2018-10-23 LAB — LIPID PANEL
Cholesterol: 184 mg/dL (ref <200)
HDL: 89 mg/dL (ref >=40)
LDL Cholesterol (Calc): 79 mg/dL (calc)
Non-HDL Cholesterol (Calc): 95 mg/dL (calc) (ref <130)
Total CHOL/HDL Ratio: 2.1 (calc) (ref <5.0)
Triglycerides: 77 mg/dL (ref <150)

## 2018-10-23 LAB — INSULIN, RANDOM: Insulin: 3.5 u[IU]/mL

## 2018-10-23 LAB — HEMOGLOBIN A1C
Hgb A1c MFr Bld: 5.1 % of total Hgb (ref <5.7)
Mean Plasma Glucose: 100 (calc)
eAG (mmol/L): 5.5 (calc)

## 2018-12-04 ENCOUNTER — Other Ambulatory Visit: Payer: Self-pay | Admitting: *Deleted

## 2018-12-04 NOTE — Patient Outreach (Signed)
Late entry 10/10/18- Telephone call to patient for follow up on [employer] Member Health Questionnaire. Spoke with pt, HIPAA verified, pt reports he is doing well and has no needs at present, per Chiropodist, pt to be placed in engagement program.  RN CM mailed welcome letter to pt home with 24 hour nurse line magnet and pamphlet.  PLAN Follow up in 6 months  Irving Shows [facility], BSN [facility] Care Coordinator [phone]

## 2018-12-08 ENCOUNTER — Other Ambulatory Visit: Payer: Self-pay | Admitting: Internal Medicine

## 2018-12-08 DIAGNOSIS — Z20828 Contact with and (suspected) exposure to other viral communicable diseases: Secondary | ICD-10-CM

## 2018-12-08 DIAGNOSIS — Z20822 Contact with and (suspected) exposure to covid-19: Secondary | ICD-10-CM

## 2018-12-31 ENCOUNTER — Other Ambulatory Visit: Payer: Self-pay | Admitting: *Deleted

## 2018-12-31 NOTE — Patient Outreach (Signed)
Case closed, pt enrolled in external program [employer], RN CM faxed case closure letter to primary care provider.  Case closed  Julie Farmer RNC, BSN [facility] Care Coordinator [phone]   

## 2019-01-26 ENCOUNTER — Other Ambulatory Visit: Payer: Self-pay

## 2019-01-26 ENCOUNTER — Other Ambulatory Visit: Payer: Self-pay | Admitting: Physician Assistant

## 2019-01-26 ENCOUNTER — Ambulatory Visit (INDEPENDENT_AMBULATORY_CARE_PROVIDER_SITE_OTHER): Payer: PPO | Admitting: Physician Assistant

## 2019-01-26 VITALS — BP 120/82 | HR 68 | Temp 97.0°F | Resp 16 | Ht 73.0 in | Wt 203.4 lb

## 2019-01-26 DIAGNOSIS — E559 Vitamin D deficiency, unspecified: Secondary | ICD-10-CM

## 2019-01-26 DIAGNOSIS — Z20822 Contact with and (suspected) exposure to covid-19: Secondary | ICD-10-CM

## 2019-01-26 DIAGNOSIS — Z79899 Other long term (current) drug therapy: Secondary | ICD-10-CM | POA: Diagnosis not present

## 2019-01-26 DIAGNOSIS — I10 Essential (primary) hypertension: Secondary | ICD-10-CM

## 2019-01-26 DIAGNOSIS — Z20828 Contact with and (suspected) exposure to other viral communicable diseases: Secondary | ICD-10-CM

## 2019-01-26 DIAGNOSIS — E782 Mixed hyperlipidemia: Secondary | ICD-10-CM | POA: Diagnosis not present

## 2019-01-26 NOTE — Patient Instructions (Signed)
  Coronavirus Antibody testing  Antibody testing are used to detect antibodies to SARS-CoV-2 virus.  Antibodies are part of the body's immune response to exposure.  Antibodies are generally detected days to weeks after infection. HOWEVER, with the novel Coronavirus the exact time it takes to develop detectable antibodies is unknown so if tested too early can result in a negative test.   This test has not been reviewed by the FDA.  - negative results do not preclude acute COVID19 infection, that is the PCR nasal swab test.  - Results from the antibody testing should NOT be used for diagnosis or exclusion of acute infection, if you have active symptoms you need the PCR nasal swab.   ALSO REMEMBER - A positive results may be due to a past or present infection with 4 other coronaviruses that are widespread and year round that are the common cold (HKU1, NL63, OC43, 229E).   Having a positive antibody test at this time does NOT mean immunity.  Having a positive antibody test, often means past infection but does NOT exclude recently infected patients who are still contagious.   So if we order this antibody test, please remember, there still come a lot of questions or possible issues with the test.  Also, it will not change any management.  We still ask that you continue to wear a mask in public or within 6 feet of someone to prevent possible spread.  We still want you to do hand washing and contact precautions.     

## 2019-01-26 NOTE — Progress Notes (Signed)
Assessment and Plan:   Hypertension -Continue medication, monitor blood pressure at home. Continue DASH diet.  Reminder to go to the ER if any CP, SOB, nausea, dizziness, severe HA, changes vision/speech, left arm numbness and tingling and jaw pain.  Cholesterol -Continue diet and exercise. Check cholesterol.    Prediabetes  -continue diet and exercise. Check A1C   Vitamin D Def - check level and continue medications.    Hypogonadism-  continue replacement therapy, increase to 3 pumps a day, check testosterone levels as needed.   Future Appointments  Date Time Provider [place]  04/16/2019  8:30 AM Kassie Mends, RN [facility] None  05/06/2019  3:00 PM Unk Pinto, MD [facility] None    Continue diet and meds as discussed. Further disposition pending results of labs. Over 30 minutes of exam, counseling, chart review, and critical decision making was performed  HPI 68 y.o. male  presents for 3 month follow up on hypertension, cholesterol, hypogonadism, and vitamin D deficiency.    His blood pressure has been controlled at home, today their BP is BP: 120/82  He does workout. He denies chest pain, shortness of breath, dizziness.   He is on cholesterol medication, crestor 40mg  1/4 every day and denies myalgias. His cholesterol is not at goal. The cholesterol last visit was:   Lab Results  Component Value Date   CHOL 184 10/22/2018   HDL 89 10/22/2018   LDLCALC 79 10/22/2018   TRIG 77 10/22/2018   CHOLHDL 2.1 10/22/2018  Last A1C in the office was:  Lab Results  Component Value Date   HGBA1C 5.1 10/22/2018  Patient is on Vitamin D supplement.   Lab Results  Component Value Date   VD25OH 60 10/22/2018    He has a history of testosterone deficiency and is on testosterone replacement with the gel, four pumps daily a day.  He states that the testosterone helps with his energy, libido, muscle mass but not much of a difference. Lab Results  Component Value Date   TESTOSTERONE 321 03/27/2018   BMI is Body mass index is 26.84 kg/m., he is working on diet and exercise. Wt Readings from Last 3 Encounters:  01/26/19 203 lb 6.4 oz (92.3 kg)  10/22/18 204 lb 9.6 oz (92.8 kg)  07/21/18 208 lb (94.3 kg)     Current Medications:  Current Outpatient Medications on File Prior to Visit  Medication Sig Dispense Refill  . aspirin 81 MG tablet Take 81 mg by mouth daily.     Marland Kitchen atenolol (TENORMIN) 100 MG tablet TAKE 1 TABLET BY MOUTH EVERY DAY 90 tablet 1  . Cholecalciferol (VITAMIN D PO) Take by mouth. Takes 4000 units daily    . Multiple Vitamin (MULTIVITAMIN) tablet Take 1 tablet by mouth daily.    . Omega-3 Fatty Acids (FISH OIL) 1000 MG CAPS Take by mouth daily.    . rosuvastatin (CRESTOR) 10 MG tablet Take 1 tablet (10 mg total) by mouth daily. 90 tablet 3  . Testosterone 20.25 MG/ACT (1.62%) GEL APPLY 2 PUMPS DAILY (1 PUMP TO EACH ARM) 75 g 1  . vitamin C (ASCORBIC ACID) 500 MG tablet Take 500 mg by mouth daily.     No current facility-administered medications on file prior to visit.    Medical History:  Past Medical History:  Diagnosis Date  . Hyperlipidemia   . Hypertension   . Other testicular hypofunction   . Vitamin D deficiency    Allergies: No Known Allergies   Review of  Systems:  Review of Systems  Constitutional: Negative.   HENT: Negative.   Eyes: Negative.   Respiratory: Negative.   Cardiovascular: Negative.   Gastrointestinal: Negative.   Genitourinary: Negative.   Musculoskeletal: Negative.   Skin: Negative.   Neurological: Negative.   Endo/Heme/Allergies: Negative.   Psychiatric/Behavioral: Negative.     Family history- Review and unchanged Social history- Review and unchanged Physical Exam: BP 120/82   Pulse 68   Temp (!) 97 F (36.1 C)   Resp 16   Ht 6\' 1"  (1.854 m)   Wt 203 lb 6.4 oz (92.3 kg)   BMI 26.84 kg/m  Wt Readings from Last 3 Encounters:  01/26/19 203 lb 6.4 oz (92.3 kg)  10/22/18 204 lb 9.6 oz  (92.8 kg)  07/21/18 208 lb (94.3 kg)   General Appearance: Well nourished, in no apparent distress. Eyes: PERRLA, EOMs, conjunctiva no swelling or erythema Sinuses: No Frontal/maxillary tenderness ENT/Mouth: Ext aud canals clear, TMs without erythema, bulging. No erythema, swelling, or exudate on post pharynx.  Tonsils not swollen or erythematous. Hearing normal.  Neck: Supple, thyroid normal.  Respiratory: Respiratory effort normal, BS equal bilaterally without rales, rhonchi, wheezing or stridor.  Cardio: RRR with no MRGs. Brisk peripheral pulses without edema.  Abdomen: Soft, + BS,  Non tender, no guarding, rebound, hernias, masses. Lymphatics: Non tender without lymphadenopathy.  Musculoskeletal: Full ROM, 5/5 strength, Normal gait Skin: Warm, dry without rashes, lesions, ecchymosis.  Neuro: Cranial nerves intact. Normal muscle tone, no cerebellar symptoms. Psych: Awake and oriented X 3, normal affect, Insight and Judgment appropriate.    Quentin MullingAmanda Locklyn Henriquez, PA-C 4:06 PM [facility]

## 2019-01-27 LAB — CBC WITH DIFFERENTIAL/PLATELET
Absolute Monocytes: 461 cells/uL (ref 200–950)
Basophils Absolute: 32 cells/uL (ref 0–200)
Basophils Relative: 0.5 %
Eosinophils Absolute: 211 cells/uL (ref 15–500)
Eosinophils Relative: 3.3 %
HCT: 43.7 % (ref 38.5–50.0)
Hemoglobin: 15.4 g/dL (ref 13.2–17.1)
Lymphs Abs: 3347 cells/uL (ref 850–3900)
MCH: 32.2 pg (ref 27.0–33.0)
MCHC: 35.2 g/dL (ref 32.0–36.0)
MCV: 91.2 fL (ref 80.0–100.0)
MPV: 9.4 fL (ref 7.5–12.5)
Monocytes Relative: 7.2 %
Neutro Abs: 2349 cells/uL (ref 1500–7800)
Neutrophils Relative %: 36.7 %
Platelets: 255 10*3/uL (ref 140–400)
RBC: 4.79 10*6/uL (ref 4.20–5.80)
RDW: 13.1 % (ref 11.0–15.0)
Total Lymphocyte: 52.3 %
WBC: 6.4 10*3/uL (ref 3.8–10.8)

## 2019-01-27 LAB — COMPLETE METABOLIC PANEL WITH GFR
AG Ratio: 1.6 (calc) (ref 1.0–2.5)
ALT: 28 U/L (ref 9–46)
AST: 26 U/L (ref 10–35)
Albumin: 4.1 g/dL (ref 3.6–5.1)
Alkaline phosphatase (APISO): 55 U/L (ref 35–144)
BUN: 13 mg/dL (ref 7–25)
CO2: 24 mmol/L (ref 20–32)
Calcium: 9.4 mg/dL (ref 8.6–10.3)
Chloride: 105 mmol/L (ref 98–110)
Creat: 0.85 mg/dL (ref 0.70–1.25)
GFR, Est African American: 104 mL/min/{1.73_m2} (ref >=60)
GFR, Est Non African American: 90 mL/min/{1.73_m2} (ref >=60)
Globulin: 2.6 g/dL (calc) (ref 1.9–3.7)
Glucose, Bld: 84 mg/dL (ref 65–99)
Potassium: 4 mmol/L (ref 3.5–5.3)
Sodium: 138 mmol/L (ref 135–146)
Total Bilirubin: 0.6 mg/dL (ref 0.2–1.2)
Total Protein: 6.7 g/dL (ref 6.1–8.1)

## 2019-01-27 LAB — TSH: TSH: 2.21 mIU/L (ref 0.40–4.50)

## 2019-01-27 LAB — LIPID PANEL
Cholesterol: 173 mg/dL (ref <200)
HDL: 72 mg/dL (ref >=40)
LDL Cholesterol (Calc): 80 mg/dL (calc)
Non-HDL Cholesterol (Calc): 101 mg/dL (calc) (ref <130)
Total CHOL/HDL Ratio: 2.4 (calc) (ref <5.0)
Triglycerides: 110 mg/dL (ref <150)

## 2019-01-27 LAB — VITAMIN D 25 HYDROXY (VIT D DEFICIENCY, FRACTURES): Vit D, 25-Hydroxy: 58 ng/mL (ref 30–100)

## 2019-01-27 LAB — MAGNESIUM: Magnesium: 2.1 mg/dL (ref 1.5–2.5)

## 2019-01-27 LAB — SAR COV2 SEROLOGY (COVID19)AB(IGG),IA: SARS CoV2 AB IGG: NEGATIVE

## 2019-02-23 ENCOUNTER — Other Ambulatory Visit: Payer: Self-pay | Admitting: Physician Assistant

## 2019-02-25 ENCOUNTER — Telehealth: Payer: Self-pay | Admitting: *Deleted

## 2019-02-25 NOTE — Telephone Encounter (Signed)
A copy of the Testosterone Gel 1.62% approval from [facility] faxed to [facility] at [phone].  Approval is through 02/24/2020.

## 2019-03-10 ENCOUNTER — Encounter: Payer: Self-pay | Admitting: Gastroenterology

## 2019-04-08 ENCOUNTER — Other Ambulatory Visit: Payer: Self-pay

## 2019-04-08 ENCOUNTER — Ambulatory Visit (AMBULATORY_SURGERY_CENTER): Payer: Self-pay | Admitting: *Deleted

## 2019-04-08 VITALS — Temp 96.6°F | Ht 73.0 in | Wt 202.2 lb

## 2019-04-08 DIAGNOSIS — Z860100 Personal history of colon polyps, unspecified: Secondary | ICD-10-CM

## 2019-04-08 DIAGNOSIS — Z800 Family history of malignant neoplasm of digestive organs: Secondary | ICD-10-CM

## 2019-04-08 MED ORDER — PEG 3350-KCL-NA BICARB-NACL 420 G PO SOLR: 4000.0000 mL | Freq: Once | ORAL | 0 refills | Status: AC

## 2019-04-08 NOTE — Progress Notes (Signed)
No egg or soy allergy known to patient  No issues with past sedation with any surgeries  or procedures, no intubation problems  No diet pills per patient No home 02 use per patient  No blood thinners per patient  Pt denies issues with constipation  No A fib or A flutter  EMMI video sent to pt's e mail   Due to the COVID-19 pandemic we are asking patients to follow these guidelines. Please only bring one care partner. Please be aware that your care partner may wait in the car in the parking lot or if they feel like they will be too hot to wait in the car, they may wait in the lobby on the 4th floor. All care partners are required to wear a mask the entire time (we do not have any that we can provide them), they need to practice social distancing, and we will do a Covid check for all patient's and care partners when you arrive. Also we will check their temperature and your temperature. If the care partner waits in their car they need to stay in the parking lot the entire time and we will call them on their cell phone when the patient is ready for discharge so they can bring the car to the front of the building. Also all patient's will need to wear a mask into building.  Pt. Changed procedure date to 05/18/19 9:30 am d/t to wife schedule. Pt. Specifically denies problems wit intubation and anesthesia.

## 2019-04-13 ENCOUNTER — Other Ambulatory Visit: Payer: Self-pay | Admitting: Adult Health

## 2019-04-16 ENCOUNTER — Ambulatory Visit: Payer: PPO | Admitting: *Deleted

## 2019-04-22 ENCOUNTER — Encounter: Payer: PPO | Admitting: Gastroenterology

## 2019-05-04 ENCOUNTER — Encounter: Payer: Self-pay | Admitting: Gastroenterology

## 2019-05-06 ENCOUNTER — Other Ambulatory Visit: Payer: Self-pay

## 2019-05-06 ENCOUNTER — Ambulatory Visit (INDEPENDENT_AMBULATORY_CARE_PROVIDER_SITE_OTHER): Payer: PPO | Admitting: Internal Medicine

## 2019-05-06 ENCOUNTER — Encounter: Payer: Self-pay | Admitting: Internal Medicine

## 2019-05-06 VITALS — BP 126/82 | HR 64 | Temp 97.5°F | Resp 16 | Ht 73.0 in | Wt 203.6 lb

## 2019-05-06 DIAGNOSIS — Z0001 Encounter for general adult medical examination with abnormal findings: Secondary | ICD-10-CM

## 2019-05-06 DIAGNOSIS — Z125 Encounter for screening for malignant neoplasm of prostate: Secondary | ICD-10-CM | POA: Diagnosis not present

## 2019-05-06 DIAGNOSIS — N401 Enlarged prostate with lower urinary tract symptoms: Secondary | ICD-10-CM

## 2019-05-06 DIAGNOSIS — I10 Essential (primary) hypertension: Secondary | ICD-10-CM

## 2019-05-06 DIAGNOSIS — R7309 Other abnormal glucose: Secondary | ICD-10-CM | POA: Diagnosis not present

## 2019-05-06 DIAGNOSIS — R7303 Prediabetes: Secondary | ICD-10-CM | POA: Diagnosis not present

## 2019-05-06 DIAGNOSIS — Z79899 Other long term (current) drug therapy: Secondary | ICD-10-CM

## 2019-05-06 DIAGNOSIS — E349 Endocrine disorder, unspecified: Secondary | ICD-10-CM

## 2019-05-06 DIAGNOSIS — Z136 Encounter for screening for cardiovascular disorders: Secondary | ICD-10-CM | POA: Diagnosis not present

## 2019-05-06 DIAGNOSIS — E559 Vitamin D deficiency, unspecified: Secondary | ICD-10-CM | POA: Diagnosis not present

## 2019-05-06 DIAGNOSIS — E782 Mixed hyperlipidemia: Secondary | ICD-10-CM

## 2019-05-06 DIAGNOSIS — N138 Other obstructive and reflux uropathy: Secondary | ICD-10-CM

## 2019-05-06 DIAGNOSIS — Z8249 Family history of ischemic heart disease and other diseases of the circulatory system: Secondary | ICD-10-CM

## 2019-05-06 DIAGNOSIS — Z1212 Encounter for screening for malignant neoplasm of rectum: Secondary | ICD-10-CM

## 2019-05-06 DIAGNOSIS — F17200 Nicotine dependence, unspecified, uncomplicated: Secondary | ICD-10-CM

## 2019-05-06 DIAGNOSIS — I700 Atherosclerosis of aorta: Secondary | ICD-10-CM

## 2019-05-06 DIAGNOSIS — Z0000 Encounter for general adult medical examination without abnormal findings: Secondary | ICD-10-CM | POA: Diagnosis not present

## 2019-05-06 DIAGNOSIS — Z1211 Encounter for screening for malignant neoplasm of colon: Secondary | ICD-10-CM

## 2019-05-06 NOTE — Patient Instructions (Signed)

## 2019-05-06 NOTE — Progress Notes (Signed)
Annual  Screening/Preventative Visit  & Comprehensive Evaluation & Examination     This very nice 68 y.o. MWM  presents for a Screening /Preventative Visit & comprehensive evaluation and management of multiple medical co-morbidities.  Patient has been followed for HTN, HLD, Prediabetes and Vitamin D Deficiency.     HTN predates since 2006. Patient's BP has been controlled at home.  Today's BP is at goal - 126/82. Patient denies any cardiac symptoms as chest pain, palpitations, shortness of breath, dizziness or ankle swelling.     Patient's hyperlipidemia is controlled with diet and medications. Patient denies myalgias or other medication SE's. Last lipids were at goal: Lab Results  Component Value Date   CHOL 173 01/26/2019   HDL 72 01/26/2019   LDLCALC 80 01/26/2019   TRIG 110 01/26/2019   CHOLHDL 2.4 01/26/2019      Patient is monitored proactively for Glucose intolerance  and patient denies reactive hypoglycemic symptoms, visual blurring, diabetic polys or paresthesias. Last A1c was Normal & at goal:  Lab Results  Component Value Date   HGBA1C 5.1 10/22/2018        Patient has hx/o Testosterone Deficiency ("228.87 / 2013)  and has been on replacement with Androgel with improved stamina & sense of well being.             Finally, patient has history of Vitamin D Deficiency("28" / 2008)  and last vitamin D was near goal:  Lab Results  Component Value Date   VD25OH 58 01/26/2019   Current Outpatient Medications on File Prior to Visit  Medication Sig  . aspirin 81 MG tablet Take 81 mg by mouth daily.   Marland Kitchen atenolol (TENORMIN) 100 MG tablet Take 1 tablet Daily for BP  . Cholecalciferol (VITAMIN D PO) Take by mouth. Takes 4000 units daily  . GLUCOSAMINE-CHONDROITIN PO Take by mouth 2 (two) times daily.   . Multiple Vitamin (MULTIVITAMIN) tablet Take 1 tablet by mouth daily.  . Omega-3 Fatty Acids (FISH OIL) 1000 MG CAPS Take by mouth daily.  . rosuvastatin (CRESTOR) 10 MG tablet  Take 1 tablet (10 mg total) by mouth daily.  . Testosterone 20.25 MG/ACT (1.62%) GEL Apply 1 pump to each arm Daily  . vitamin C (ASCORBIC ACID) 500 MG tablet Take 500 mg by mouth daily.  Marland Kitchen zinc gluconate 50 MG tablet 50 mg. Takes 1 tablet weekly.   No current facility-administered medications on file prior to visit.    No Known Allergies   Past Medical History:  Diagnosis Date  . Allergy    SEASONAL  . Arthritis   . Hyperlipidemia    DENIES BUT TAKE MEDICATION  . Hypertension   . Other testicular hypofunction   . Vitamin D deficiency    Health Maintenance  Topic Date Due  . COLONOSCOPY  07/31/2018  . TETANUS/TDAP  08/13/2024  . INFLUENZA VACCINE  Completed  . Hepatitis C Screening  Completed  . PNA vac Low Risk Adult  Completed   Immunization History  Administered Date(s) Administered  . Influenza, High Dose Seasonal PF 03/12/2017, 03/27/2018  . Influenza, Seasonal, Injecte, Preservative Fre 06/21/2015  . Influenza-Unspecified 07/01/2014, 04/21/2019  . PPD Test 08/13/2014, 08/30/2015  . Pneumococcal Conjugate-13 08/13/2014  . Pneumococcal Polysaccharide-23 06/14/2016  . Pneumococcal-Unspecified 07/09/1997  . Td 07/09/2004  . Tdap 08/13/2014  . Zoster 06/29/2014   Last Colon  -  07/31/2013 Dr Deatra Ina -  Scheduled 05/18/2019 with Dr Loletha Carrow      Past Surgical History:  Procedure Laterality Date  . ANKLE SURGERY Left 1983   for bone spur  . APPENDECTOMY  1960  . COLONOSCOPY    . KNEE ARTHROSCOPY Left 1986, 1987  . MOUTH SURGERY  2012   For implants  . PATELLAR TENDON REPAIR Right 11/2016   [place]  . POLYPECTOMY    . TONSILLECTOMY  1958   Family History  Problem Relation Age of Onset  . Colon cancer Mother 42  . Colon polyps Mother   . Colon cancer Maternal Uncle 70  . Esophageal cancer Neg Hx   . Rectal cancer Neg Hx   . Stomach cancer Neg Hx    Social History   Socioeconomic History  . Marital status: Married    Spouse name: Mary  . Number of  children: 1 son & 1 daughter  Occupational History  . [employer]  Tobacco Use  . Smoking status: Current Some Day Smoker    Types: Cigars  . Smokeless tobacco: Never Used  . Tobacco comment: rarely smokes a  cigar  Substance and Sexual Activity  . Alcohol use: Yes    Alcohol/week: 10.0 standard drinks    Types: 10 Cans of beer per week  . Drug use: No  . Sexual activity: Not on file    ROS Constitutional: Denies fever, chills, weight loss/gain, headaches, insomnia,  night sweats or change in appetite. Does c/o fatigue. Eyes: Denies redness, blurred vision, diplopia, discharge, itchy or watery eyes.  ENT: Denies discharge, congestion, post nasal drip, epistaxis, sore throat, earache, hearing loss, dental pain, Tinnitus, Vertigo, Sinus pain or snoring.  Cardio: Denies chest pain, palpitations, irregular heartbeat, syncope, dyspnea, diaphoresis, orthopnea, PND, claudication or edema Respiratory: denies cough, dyspnea, DOE, pleurisy, hoarseness, laryngitis or wheezing.  Gastrointestinal: Denies dysphagia, heartburn, reflux, water brash, pain, cramps, nausea, vomiting, bloating, diarrhea, constipation, hematemesis, melena, hematochezia, jaundice or hemorrhoids Genitourinary: Denies dysuria, frequency, urgency, nocturia, hesitancy, discharge, hematuria or flank pain Musculoskeletal: Denies arthralgia, myalgia, stiffness, Jt. Swelling, pain, limp or strain/sprain. Denies Falls. Skin: Denies puritis, rash, hives, warts, acne, eczema or change in skin lesion Neuro: No weakness, tremor, incoordination, spasms, paresthesia or pain Psychiatric: Denies confusion, memory loss or sensory loss. Denies Depression. Endocrine: Denies change in weight, skin, hair change, nocturia, and paresthesia, diabetic polys, visual blurring or hyper / hypo glycemic episodes.  Heme/Lymph: No excessive bleeding, bruising or enlarged lymph nodes.  Physical Exam  BP 126/82   Pulse 64   Temp (!) 97.5 F (36.4 C)   Resp 16    Ht 6\' 1"  (1.854 m)   Wt 203 lb 9.6 oz (92.4 kg)   BMI 26.86 kg/m   General Appearance: Well nourished and well groomed and in no apparent distress.  Eyes: PERRLA, EOMs, conjunctiva no swelling or erythema, normal fundi and vessels. Sinuses: No frontal/maxillary tenderness ENT/Mouth: EACs patent / TMs  nl. Nares clear without erythema, swelling, mucoid exudates. Oral hygiene is good. No erythema, swelling, or exudate. Tongue normal, non-obstructing. Tonsils not swollen or erythematous. Hearing normal.  Neck: Supple, thyroid not palpable. No bruits, nodes or JVD. Respiratory: Respiratory effort normal.  BS equal and clear bilateral without rales, rhonci, wheezing or stridor. Cardio: Heart sounds are normal with regular rate and rhythm and no murmurs, rubs or gallops. Peripheral pulses are normal and equal bilaterally without edema. No aortic or femoral bruits. Chest: symmetric with normal excursions and percussion.  Abdomen: Soft, with Nl bowel sounds. Nontender, no guarding, rebound, hernias, masses, or organomegaly.  Lymphatics: Non tender without lymphadenopathy.  Musculoskeletal: Full ROM all peripheral extremities, joint stability, 5/5 strength, and normal gait. Skin: Warm and dry without rashes, lesions, cyanosis, clubbing or  ecchymosis.  Neuro: Cranial nerves intact, reflexes equal bilaterally. Normal muscle tone, no cerebellar symptoms. Sensation intact.  Pysch: Alert and oriented X 3 with normal affect, insight and judgment appropriate.   Assessment and Plan  1. Annual Preventative/Screening Exam   2. Essential hypertension  - EKG 12-Lead - US, RETROPERITNL ABD,  LTD - Urinalysis, Routine w reflex microscopic - Microalbumin / creatinine urine ratio - CBC with Differential/Platelet - COMPLETE METABOLIC PANEL WITH GFR - Magnesium - TSH  3. Hyperlipidemia, mixed  - EKG 12-Lead - US, RETROPERITNL ABD,  LTD - Lipid panel - TSH  4. Abnormal glucose  - EKG 12-Lead -  US, RETROPERITNL ABD,  LTD - Hemoglobin A1c - Insulin, random  5. Vitamin D deficiency  - VITAMIN D 25 Hydroxyl  6. Prediabetes  - EKG 12-Lead - US, RETROPERITNL ABD,  LTD - Hemoglobin A1c - Insulin, random  7. Testosterone deficiency  - Testosterone  8. Encounter for colorectal cancer screening  - POC Hemoccult Bld/Stl   9. BPH with obstruction/lower urinary tract symptoms  - Urinalysis, Routine w reflex microscopic - PSA  10. Prostate cancer screening  - PSA  11. Screening for ischemic heart disease  - EKG 12-Lead  12. FHx: heart disease  - EKG 12-Lead - US, RETROPERITNL ABD,  LTD  13. Smoker  - EKG 12-Lead - US, RETROPERITNL ABD,  LTD  14. Aortic atherosclerosis (HCC)  - US, RETROPERITNL ABD,  LTD  15. Screening for AAA (aortic abdominal aneurysm)  - US, RETROPERITNL ABD,  LTD  16. Medication management  - Urinalysis, Routine w reflex microscopic - Microalbumin / creatinine urine ratio - Testosterone - CBC with Differential/Platelet - COMPLETE METABOLIC PANEL WITH GFR - Magnesium - Lipid panel - TSH - Hemoglobin A1c - Insulin, random - VITAMIN D 25 Hydroxyl        Patient was counseled in prudent diet, weight control to achieve/maintain BMI less than 25, BP monitoring, regular exercise and medications as discussed.  Discussed med effects and SE's. Routine screening labs and tests as requested with regular follow-up as recommended. Over 40 minutes of exam, counseling, chart review and high complex critical decision making was performed   Marinus MawWilliam D Karema Tocci, MD

## 2019-05-07 LAB — COMPLETE METABOLIC PANEL WITH GFR
AG Ratio: 1.8 (calc) (ref 1.0–2.5)
ALT: 27 U/L (ref 9–46)
AST: 21 U/L (ref 10–35)
Albumin: 4.2 g/dL (ref 3.6–5.1)
Alkaline phosphatase (APISO): 51 U/L (ref 35–144)
BUN: 15 mg/dL (ref 7–25)
CO2: 25 mmol/L (ref 20–32)
Calcium: 9.1 mg/dL (ref 8.6–10.3)
Chloride: 104 mmol/L (ref 98–110)
Creat: 0.84 mg/dL (ref 0.70–1.25)
GFR, Est African American: 105 mL/min/{1.73_m2} (ref >=60)
GFR, Est Non African American: 91 mL/min/{1.73_m2} (ref >=60)
Globulin: 2.3 g/dL (calc) (ref 1.9–3.7)
Glucose, Bld: 101 mg/dL — ABNORMAL HIGH (ref 65–99)
Potassium: 4.2 mmol/L (ref 3.5–5.3)
Sodium: 138 mmol/L (ref 135–146)
Total Bilirubin: 0.4 mg/dL (ref 0.2–1.2)
Total Protein: 6.5 g/dL (ref 6.1–8.1)

## 2019-05-07 LAB — MICROALBUMIN / CREATININE URINE RATIO
Creatinine, Urine: 122 mg/dL (ref 20–320)
Microalb Creat Ratio: 2 mcg/mg creat (ref <30)
Microalb, Ur: 0.3 mg/dL

## 2019-05-07 LAB — TSH: TSH: 1.62 mIU/L (ref 0.40–4.50)

## 2019-05-07 LAB — URINALYSIS, ROUTINE W REFLEX MICROSCOPIC
Bilirubin Urine: NEGATIVE
Glucose, UA: NEGATIVE
Hgb urine dipstick: NEGATIVE
Ketones, ur: NEGATIVE
Leukocytes,Ua: NEGATIVE
Nitrite: NEGATIVE
Protein, ur: NEGATIVE
Specific Gravity, Urine: 1.023 (ref 1.001–1.03)
pH: 6.5 (ref 5.0–8.0)

## 2019-05-07 LAB — TESTOSTERONE: Testosterone: 372 ng/dL (ref 250–827)

## 2019-05-07 LAB — LIPID PANEL
Cholesterol: 165 mg/dL (ref <200)
HDL: 74 mg/dL (ref >=40)
LDL Cholesterol (Calc): 65 mg/dL (calc)
Non-HDL Cholesterol (Calc): 91 mg/dL (calc) (ref <130)
Total CHOL/HDL Ratio: 2.2 (calc) (ref <5.0)
Triglycerides: 184 mg/dL — ABNORMAL HIGH (ref <150)

## 2019-05-07 LAB — CBC WITH DIFFERENTIAL/PLATELET
Absolute Monocytes: 581 cells/uL (ref 200–950)
Basophils Absolute: 28 cells/uL (ref 0–200)
Basophils Relative: 0.4 %
Eosinophils Absolute: 308 cells/uL (ref 15–500)
Eosinophils Relative: 4.4 %
HCT: 44.7 % (ref 38.5–50.0)
Hemoglobin: 15.5 g/dL (ref 13.2–17.1)
Lymphs Abs: 2961 cells/uL (ref 850–3900)
MCH: 32.4 pg (ref 27.0–33.0)
MCHC: 34.7 g/dL (ref 32.0–36.0)
MCV: 93.3 fL (ref 80.0–100.0)
MPV: 10.2 fL (ref 7.5–12.5)
Monocytes Relative: 8.3 %
Neutro Abs: 3122 cells/uL (ref 1500–7800)
Neutrophils Relative %: 44.6 %
Platelets: 288 10*3/uL (ref 140–400)
RBC: 4.79 10*6/uL (ref 4.20–5.80)
RDW: 12.9 % (ref 11.0–15.0)
Total Lymphocyte: 42.3 %
WBC: 7 10*3/uL (ref 3.8–10.8)

## 2019-05-07 LAB — HEMOGLOBIN A1C
Hgb A1c MFr Bld: 5 % of total Hgb (ref <5.7)
Mean Plasma Glucose: 97 (calc)
eAG (mmol/L): 5.4 (calc)

## 2019-05-07 LAB — PSA: PSA: 1.1 ng/mL (ref <=4.0)

## 2019-05-07 LAB — VITAMIN D 25 HYDROXY (VIT D DEFICIENCY, FRACTURES): Vit D, 25-Hydroxy: 67 ng/mL (ref 30–100)

## 2019-05-07 LAB — INSULIN, RANDOM: Insulin: 17.4 u[IU]/mL

## 2019-05-07 LAB — MAGNESIUM: Magnesium: 2.2 mg/dL (ref 1.5–2.5)

## 2019-05-09 ENCOUNTER — Encounter: Payer: Self-pay | Admitting: Internal Medicine

## 2019-05-18 ENCOUNTER — Encounter: Payer: PPO | Admitting: Gastroenterology

## 2019-05-18 ENCOUNTER — Encounter: Payer: Self-pay | Admitting: Gastroenterology

## 2019-05-18 ENCOUNTER — Other Ambulatory Visit: Payer: Self-pay

## 2019-05-18 ENCOUNTER — Ambulatory Visit (AMBULATORY_SURGERY_CENTER): Payer: PPO | Admitting: Gastroenterology

## 2019-05-18 VITALS — BP 113/70 | HR 64 | Temp 98.5°F | Resp 10 | Ht 73.0 in | Wt 202.0 lb

## 2019-05-18 DIAGNOSIS — Z1211 Encounter for screening for malignant neoplasm of colon: Secondary | ICD-10-CM | POA: Diagnosis not present

## 2019-05-18 DIAGNOSIS — Z800 Family history of malignant neoplasm of digestive organs: Secondary | ICD-10-CM | POA: Diagnosis not present

## 2019-05-18 MED ORDER — SODIUM CHLORIDE 0.9 % IV SOLN: 500.0000 mL | Freq: Once | INTRAVENOUS | Status: DC

## 2019-05-18 NOTE — Progress Notes (Signed)
Pt tolerated well. VSS. Arousable and to recovery. 

## 2019-05-18 NOTE — Op Note (Signed)
[place] Patient Name: [place] Procedure Date: 05/18/2019 9:44 AM MRN: [id number] Endoscopist: Mallie Mussel L. Loletha Carrow , MD Age: 68 Referring MD:  Date of Birth: Jul 11, 1950 Gender: Male Account #: [id number] Procedure:                Colonoscopy Indications:              Screening in patient at increased risk: Colorectal                            cancer in mother 66 or older (no polyps on 2015                            colonoscopy) Medicines:                Monitored Anesthesia Care Procedure:                Pre-Anesthesia Assessment:                           - Prior to the procedure, a History and Physical                            was performed, and patient medications and                            allergies were reviewed. The patient's tolerance of                            previous anesthesia was also reviewed. The risks                            and benefits of the procedure and the sedation                            options and risks were discussed with the patient.                            All questions were answered, and informed consent                            was obtained. Prior Anticoagulants: The patient has                            taken no previous anticoagulant or antiplatelet                            agents except for aspirin. ASA Grade Assessment: II                            - A patient with mild systemic disease. After                            reviewing the risks and benefits, the patient was  deemed in satisfactory condition to undergo the                            procedure.                           After obtaining informed consent, the colonoscope                            was passed under direct vision. Throughout the                            procedure, the patient's blood pressure, pulse, and                            oxygen saturations were monitored continuously. The   Colonoscope was introduced through the anus and                            advanced to the the terminal ileum, with                            identification of the appendiceal orifice and IC                            valve. The colonoscopy was performed without                            difficulty. The patient tolerated the procedure                            well. The quality of the bowel preparation was                            good. The terminal ileum, ileocecal valve,                            appendiceal orifice, and rectum were photographed. Scope In: 9:54:51 AM Scope Out: 10:10:37 AM Scope Withdrawal Time: 0 hours 9 minutes 0 seconds  Total Procedure Duration: 0 hours 15 minutes 46 seconds  Findings:                 The perianal and digital rectal examinations were                            normal.                           The terminal ileum appeared normal.                           Multiple small-mouthed diverticula were found in                            the left colon.  The exam was otherwise without abnormality on                            direct and retroflexion views. Complications:            No immediate complications. Estimated Blood Loss:     Estimated blood loss: none. Impression:               - The examined portion of the ileum was normal.                           - Diverticulosis in the left colon.                           - The examination was otherwise normal on direct                            and retroflexion views.                           - No specimens collected. Recommendation:           - Patient has a contact number available for                            emergencies. The signs and symptoms of potential                            delayed complications were discussed with the                            patient. Return to normal activities tomorrow.                            Written discharge instructions were  provided to the                            patient.                           - Resume previous diet.                           - Continue present medications.                           - Repeat colonoscopy in 5 years for screening                            purposes. Henry L. Myrtie Neither, MD 05/18/2019 10:17:23 AM This report has been signed electronically.

## 2019-05-18 NOTE — Patient Instructions (Addendum)
Thank you for allowing [place] to care for you today!  Resume previous diet and medications today.  Return to your normal activities tomorrow.  Recommend next screening colonoscopy in 5 years.     YOU HAD AN ENDOSCOPIC PROCEDURE TODAY AT [facility]:   Refer to the procedure report that was given to you for any specific questions about what was found during the examination.  If the procedure report does not answer your questions, please call your gastroenterologist to clarify.  If you requested that your care partner not be given the details of your procedure findings, then the procedure report has been included in a sealed envelope for you to review at your convenience later.  YOU SHOULD EXPECT: Some feelings of bloating in the abdomen. Passage of more gas than usual.  Walking can help get rid of the air that was put into your GI tract during the procedure and reduce the bloating. If you had a lower endoscopy (such as a colonoscopy or flexible sigmoidoscopy) you may notice spotting of blood in your stool or on the toilet paper. If you underwent a bowel prep for your procedure, you may not have a normal bowel movement for a few days.  Please Note:  You might notice some irritation and congestion in your nose or some drainage.  This is from the oxygen used during your procedure.  There is no need for concern and it should clear up in a day or so.  SYMPTOMS TO REPORT IMMEDIATELY:   Following lower endoscopy (colonoscopy or flexible sigmoidoscopy):  Excessive amounts of blood in the stool  Significant tenderness or worsening of abdominal pains  Swelling of the abdomen that is new, acute  Fever of 100F or higher  For urgent or emergent issues, a gastroenterologist can be reached at any hour by calling [phone].   DIET:  We do recommend a small meal at first, but then you may proceed to your regular diet.  Drink plenty of fluids but you should avoid alcoholic beverages for  24 hours.  ACTIVITY:  You should plan to take it easy for the rest of today and you should NOT DRIVE or use heavy machinery until tomorrow (because of the sedation medicines used during the test).    FOLLOW UP: Our staff will call the number listed on your records 48-72 hours following your procedure to check on you and address any questions or concerns that you may have regarding the information given to you following your procedure. If we do not reach you, we will leave a message.  We will attempt to reach you two times.  During this call, we will ask if you have developed any symptoms of COVID 19. If you develop any symptoms (ie: fever, flu-like symptoms, shortness of breath, cough etc.) before then, please call [phone].  If you test positive for Covid 19 in the 2 weeks post procedure, please call and report this information to [place].    If any biopsies were taken you will be contacted by phone or by letter within the next 1-3 weeks.  Please call us at [phone] if you have not heard about the biopsies in 3 weeks.    SIGNATURES/CONFIDENTIALITY: You and/or your care partner have signed paperwork which will be entered into your electronic medical record.  These signatures attest to the fact that that the information above on your After Visit Summary has been reviewed and is understood.  Full responsibility of the confidentiality of this  discharge information lies with you and/or your care-partner. 

## 2019-05-18 NOTE — Progress Notes (Signed)
Pt's states no medical or surgical changes since previsit or office visit.  JB temps, CW vitals and SH IV.

## 2019-05-20 ENCOUNTER — Telehealth: Payer: Self-pay | Admitting: *Deleted

## 2019-05-20 NOTE — Telephone Encounter (Signed)
  Follow up Call-  Call back number 05/18/2019  Post procedure Call Back phone  # [phone]  Permission to leave phone message Yes  Some recent data might be hidden     Patient questions:  Do you have a fever, pain , or abdominal swelling? No. Pain Score  0 *  Have you tolerated food without any problems? Yes.    Have you been able to return to your normal activities? Yes.    Do you have any questions about your discharge instructions: Diet   No. Medications  No. Follow up visit  No.  Do you have questions or concerns about your Care? No.  Actions: * If pain score is 4 or above: No action needed, pain <4.  1. Have you developed a fever since your procedure? no  2.   Have you had an respiratory symptoms (SOB or cough) since your procedure? no  3.   Have you tested positive for COVID 19 since your procedure no  4.   Have you had any family members/close contacts diagnosed with the COVID 19 since your procedure?  no   If yes to any of these questions please route to Joylene John, RN and Alphonsa Gin, Therapist, sports.

## 2019-08-18 NOTE — Progress Notes (Deleted)
MEDICARE ANNUAL WELLNESS VISIT AND FOLLOW UP Assessment:    Encounter for Medicare annual wellness exam Due annually  Essential hypertension Continue medication Monitor blood pressure at home; call if consistently over 130/80 Continue DASH diet.   Reminder to go to the ER if any CP, SOB, nausea, dizziness, severe HA, changes vision/speech, left arm numbness and tingling and jaw pain.  Testosterone deficiency - continue replacement therapy, patient perceives benefit, check testosterone levels as needed.   Vitamin D deficiency Near goal at recent check; continue to recommend supplementation for goal of 70-100 Defer vitamin D level  Other abnormal glucose Recent A1Cs at goal Discussed diet/exercise, weight management  Defer A1C; check BMP  Medication management CBC, CMP/GFR  Mixed hyperlipidemia Continue medication Continue low cholesterol diet and exercise.  Check lipid panel.   Vitamin D deficiency At goal at recent check; continue to recommend supplementation for goal of 60-100 Defer vitamin D level  BPH with LUTS ***  Smoker ***  Aortic atherosclerosis ***   Over 30 minutes of exam, counseling, chart review, and critical decision making was performed  Future Appointments  Date Time Provider Department Center  08/19/2019  3:30 PM Judd Gaudier, NP [facility] None  11/18/2019  3:30 PM Lucky Cowboy, MD [facility] None  06/08/2020  3:00 PM Lucky Cowboy, MD [facility] None     Plan:   During the course of the visit the patient was educated and counseled about appropriate screening and preventive services including:    Pneumococcal vaccine   Influenza vaccine  Prevnar 13  Td vaccine  Screening electrocardiogram  Colorectal cancer screening  Diabetes screening  Glaucoma screening  Nutrition counseling    Subjective:  Brett Perez is a 69 y.o. male who presents for Medicare Annual Wellness Visit and 3 month follow up for HTN,  hyperlipidemia, glucose management, and vitamin D Def.   Smoker ***  Aortic atherosclerosis ***   BMI is There is no height or weight on file to calculate BMI., he has been working on diet and exercise. Wt Readings from Last 3 Encounters:  05/18/19 202 lb (91.6 kg)  05/06/19 203 lb 9.6 oz (92.4 kg)  04/08/19 202 lb 3.2 oz (91.7 kg)   His blood pressure has been controlled at home, today their BP is   He does workout. He denies chest pain, shortness of breath, dizziness.   He is on cholesterol medication (crestor 10 mg daily) and denies myalgias. His cholesterol is at goal. The cholesterol last visit was:   Lab Results  Component Value Date   CHOL 165 05/06/2019   HDL 74 05/06/2019   LDLCALC 65 05/06/2019   TRIG 184 (H) 05/06/2019   CHOLHDL 2.2 05/06/2019   He has been working on diet and exercise for glucose management, and denies foot ulcerations, increased appetite, nausea, paresthesia of the feet, polydipsia, polyuria, visual disturbances, vomiting and weight loss. Last A1C in the office was:  Lab Results  Component Value Date   HGBA1C 5.0 05/06/2019   Last GFR Lab Results  Component Value Date   GFRNONAA 91 05/06/2019    Patient is on Vitamin D supplement and at goal:   Lab Results  Component Value Date   VD25OH 67 05/06/2019     He has a history of testosterone deficiency and is on testosterone replacement (gel). Also on zinc supplement. He states that the testosterone helps with his energy, libido, muscle mass. Lab Results  Component Value Date   TESTOSTERONE 372 05/06/2019  Medication Review:  Current Outpatient Medications (Endocrine & Metabolic):  Marland Kitchen  Testosterone 20.25 MG/ACT (1.62%) GEL, Apply 1 pump to each arm Daily  Current Outpatient Medications (Cardiovascular):  .  atenolol (TENORMIN) 100 MG tablet, Take 1 tablet Daily for BP .  rosuvastatin (CRESTOR) 10 MG tablet, Take 1 tablet (10 mg total) by mouth daily.   Current Outpatient Medications  (Analgesics):  .  aspirin 81 MG tablet, Take 81 mg by mouth daily.    Current Outpatient Medications (Other):  Marland Kitchen  Cholecalciferol (VITAMIN D PO), Take by mouth. Takes 4000 units daily .  GLUCOSAMINE-CHONDROITIN PO, Take by mouth 2 (two) times daily.  .  Multiple Vitamin (MULTIVITAMIN) tablet, Take 1 tablet by mouth daily. .  Omega-3 Fatty Acids (FISH OIL) 1000 MG CAPS, Take by mouth daily. .  vitamin C (ASCORBIC ACID) 500 MG tablet, Take 500 mg by mouth daily. Marland Kitchen  zinc gluconate 50 MG tablet, 50 mg. Takes 1 tablet weekly.  Allergies: No Known Allergies  Current Problems (verified) has Hyperlipidemia, mixed; Essential hypertension; Vitamin D deficiency; Testosterone deficiency; Abnormal glucose; Medication management; Encounter for general adult medical examination with abnormal findings; Aortic atherosclerosis (HCC); Smoker; Prediabetes; Family history of ischemic heart disease; and BPH with obstruction/lower urinary tract symptoms on their problem list.  Screening Tests Immunization History  Administered Date(s) Administered  . Influenza, High Dose Seasonal PF 03/12/2017, 03/27/2018  . Influenza, Seasonal, Injecte, Preservative Fre 06/21/2015  . Influenza-Unspecified 07/01/2014, 04/21/2019  . PPD Test 08/13/2014, 08/30/2015  . Pneumococcal Conjugate-13 08/13/2014  . Pneumococcal Polysaccharide-23 06/14/2016  . Pneumococcal-Unspecified 07/09/1997  . Td 07/09/2004  . Tdap 08/13/2014  . Zoster 06/29/2014   Preventative care: Last colonoscopy: 05/2019, due 2025 CXR 2002 *** smoker?  [place] AB 2004  Prior vaccinations: TD or Tdap: 2016  Influenza: 04/2019  Pneumococcal: 1999, 2017 Prevnar13: 2016 Shingles/Zostavax: 2015  Names of Other Physician/Practitioners you currently use: 1. [facility] here for primary care 2. Dr. Nile Riggs, eye doctor, last visit 10 years *** 3. Dr. Yetta Barre, dentist, last visit 2019 q6 months  Patient Care  Team: Lucky Cowboy, MD as PCP - General (Internal Medicine)  Surgical: He  has a past surgical history that includes Appendectomy (1960); Ankle surgery (Left, 1983); Tonsillectomy (1958); Knee arthroscopy (Left, 1986, 1987); Patellar tendon repair (Right, 11/2016); Mouth surgery (2012); Colonoscopy; and Polypectomy. Family His family history includes Colon cancer (age of onset: 54) in his maternal uncle; Colon cancer (age of onset: 95) in his mother; Colon polyps in his mother. Social history  He reports that he has been smoking cigars. He has never used smokeless tobacco. He reports current alcohol use of about 10.0 standard drinks of alcohol per week. He reports that he does not use drugs.  MEDICARE WELLNESS OBJECTIVES: Physical activity:   Cardiac risk factors:   Depression/mood screen:   Depression screen [facility] 2/9 05/09/2019  Decreased Interest 0  Down, Depressed, Hopeless 0  PHQ - 2 Score 0    ADLs:  In your present state of health, do you have any difficulty performing the following activities: 10/21/2018  Hearing? N  Vision? N  Difficulty concentrating or making decisions? N  Walking or climbing stairs? N  Dressing or bathing? N  Doing errands, shopping? N  Some recent data might be hidden     Cognitive Testing  Alert? Yes  Normal Appearance?Yes  Oriented to person? Yes  Place? Yes   Time? Yes  Recall of three objects?  Yes  Can  perform simple calculations? Yes  Displays appropriate judgment?Yes  Can read the correct time from a watch face?Yes  EOL planning:     Objective:   There were no vitals filed for this visit. There is no height or weight on file to calculate BMI.  General appearance: alert, no distress, WD/WN, male HEENT: normocephalic, sclerae anicteric, TMs pearly, nares patent, no discharge or erythema, pharynx normal Oral cavity: MMM, no lesions Neck: supple, no lymphadenopathy, no thyromegaly, no masses Heart: RRR, normal S1, S2, no  murmurs Lungs: CTA bilaterally, no wheezes, rhonchi, or rales Abdomen: +bs, soft, non tender, non distended, no masses, no hepatomegaly, no splenomegaly Musculoskeletal: Patient is able to ambulate well. Gait is not  Antalgic. Straight leg raising with dorsiflexion negative bilaterally for radicular symptoms. Sensory exam in the legs are normal. Knee reflexes are normal Ankle reflexes are normal Strength is normal and symmetric in arms and legs. There is not SI tenderness to palpation.  There is paraspinal muscle spasm.  There is not midline tenderness.  FROM of spine without pain in all spheres.  Extremities: no edema, no cyanosis, no clubbing Pulses: 2+ symmetric, upper and lower extremities, normal cap refill Neurological: alert, oriented x 3, CN2-12 intact, strength normal upper extremities and lower extremities, sensation normal throughout, DTRs 2+ throughout, no cerebellar signs, gait normal Psychiatric: normal affect, behavior normal, pleasant   Medicare Attestation I have personally reviewed: The patient's medical and social history Their use of alcohol, tobacco or illicit drugs Their current medications and supplements The patient's functional ability including ADLs,fall risks, home safety risks, cognitive, and hearing and visual impairment Diet and physical activities Evidence for depression or mood disorders  The patient's weight, height, BMI, and visual acuity have been recorded in the chart.  I have made referrals, counseling, and provided education to the patient based on review of the above and I have provided the patient with a written personalized care plan for preventive services.     Izora Ribas, NP   08/18/2019

## 2019-08-19 ENCOUNTER — Ambulatory Visit: Payer: PPO | Admitting: Adult Health

## 2019-08-21 NOTE — Progress Notes (Signed)
MEDICARE ANNUAL WELLNESS VISIT AND FOLLOW UP Assessment:    Encounter for Medicare annual wellness exam Due annually  Essential hypertension Continue medication Monitor blood pressure at home; call if consistently over 130/80 Continue DASH diet.   Reminder to go to the ER if any CP, SOB, nausea, dizziness, severe HA, changes vision/speech, left arm numbness and tingling and jaw pain.  Testosterone deficiency - continue replacement therapy, patient perceives benefit, check testosterone levels as needed.   Vitamin D deficiency Near goal at recent check; continue to recommend supplementation for goal of 70-100 Defer vitamin D level  Other abnormal glucose Recent A1Cs at goal Discussed diet/exercise, weight management  Defer A1C; check BMP  Medication management CBC, CMP/GFR  Mixed hyperlipidemia Continue medication Continue low cholesterol diet and exercise.  Check lipid panel.   Vitamin D deficiency At goal at recent check; continue to recommend supplementation for goal of 60-100 Defer vitamin D level  Smoker Rare cigar smoker Discussed risks associated with tobacco use and advised to reduce or quit Patient is not ready to do so, but advised to consider strongly Will follow up at the next visit Obtain screening CXR     Over 30 minutes of exam, counseling, chart review, and critical decision making was performed  Future Appointments  Date Time Provider Department Center  11/18/2019  3:30 PM Lucky Cowboy, MD [facility] None  06/08/2020  3:00 PM Lucky Cowboy, MD [facility] None     Plan:   During the course of the visit the patient was educated and counseled about appropriate screening and preventive services including:    Pneumococcal vaccine   Influenza vaccine  Prevnar 13  Td vaccine  Screening electrocardiogram  Colorectal cancer screening  Diabetes screening  Glaucoma screening  Nutrition counseling    Subjective:  Brett Perez  is a 68 y.o. male who presents for Medicare Annual Wellness Visit and 3 month follow up for HTN, hyperlipidemia, glucose management, and vitamin D Def.   He is a occasional cigar smoker, has been cutting back, reports only 1-2 in the last several months.   BMI is Body mass index is 27.18 kg/m., he has been working on diet and exercise, walking on treadmill 3 days a week due to recent weight gain.  Wt Readings from Last 3 Encounters:  08/24/19 206 lb (93.4 kg)  05/18/19 202 lb (91.6 kg)  05/06/19 203 lb 9.6 oz (92.4 kg)   He does have BP cuff, hasn't been checking recently due to consistent good control, today their BP is BP: 140/82 He does workout. He denies chest pain, shortness of breath, dizziness.   He is on cholesterol medication (crestor 10 mg daily) and denies myalgias. His cholesterol is at goal. The cholesterol last visit was:   Lab Results  Component Value Date   CHOL 165 05/06/2019   HDL 74 05/06/2019   LDLCALC 65 05/06/2019   TRIG 184 (H) 05/06/2019   CHOLHDL 2.2 05/06/2019   He has been working on diet and exercise for glucose management, and denies foot ulcerations, increased appetite, nausea, paresthesia of the feet, polydipsia, polyuria, visual disturbances, vomiting and weight loss. Last A1C in the office was:  Lab Results  Component Value Date   HGBA1C 5.0 05/06/2019   Last GFR Lab Results  Component Value Date   GFRNONAA 91 05/06/2019    Patient is on Vitamin D supplement and at goal:   Lab Results  Component Value Date   VD25OH 67 05/06/2019     He  has a history of testosterone deficiency and is on testosterone replacement (gel). Also on zinc supplement. He states that the testosterone helps with his energy, libido, muscle mass. Lab Results  Component Value Date   TESTOSTERONE 372 05/06/2019     Medication Review:  Current Outpatient Medications (Endocrine & Metabolic):  Marland Kitchen  Testosterone 20.25 MG/ACT (1.62%) GEL, Apply 1 pump to each arm  Daily  Current Outpatient Medications (Cardiovascular):  .  atenolol (TENORMIN) 100 MG tablet, Take 1 tablet Daily for BP .  rosuvastatin (CRESTOR) 10 MG tablet, Take 1 tablet (10 mg total) by mouth daily.   Current Outpatient Medications (Analgesics):  .  aspirin 81 MG tablet, Take 81 mg by mouth daily.    Current Outpatient Medications (Other):  Marland Kitchen  Cholecalciferol (VITAMIN D PO), Take by mouth. Takes 4000 units daily .  GLUCOSAMINE-CHONDROITIN PO, Take by mouth 2 (two) times daily.  .  Multiple Vitamin (MULTIVITAMIN) tablet, Take 1 tablet by mouth daily. .  Omega-3 Fatty Acids (FISH OIL) 1000 MG CAPS, Take by mouth daily. .  vitamin C (ASCORBIC ACID) 500 MG tablet, Take 500 mg by mouth daily. Marland Kitchen  zinc gluconate 50 MG tablet, 50 mg. Takes 1 tablet weekly.  Allergies: No Known Allergies  Current Problems (verified) has Hyperlipidemia, mixed; Essential hypertension; Vitamin D deficiency; Testosterone deficiency; Abnormal glucose; Medication management; Smoker; and Family history of ischemic heart disease on their problem list.  Screening Tests Immunization History  Administered Date(s) Administered  . Influenza, High Dose Seasonal PF 03/12/2017, 03/27/2018  . Influenza, Seasonal, Injecte, Preservative Fre 06/21/2015  . Influenza-Unspecified 07/01/2014, 04/21/2019  . PFIZER SARS-COV-2 Vaccination 07/28/2019, 08/18/2019  . PPD Test 08/13/2014, 08/30/2015  . Pneumococcal Conjugate-13 08/13/2014  . Pneumococcal Polysaccharide-23 06/14/2016  . Pneumococcal-Unspecified 07/09/1997  . Td 07/09/2004  . Tdap 08/13/2014  . Zoster 06/29/2014   Preventative care: Last colonoscopy: 05/2019, due 2025 CXR 2002, cigar smoker, CXR ordered  [place] AB 2004  Prior vaccinations: TD or Tdap: 2016  Influenza: 04/2019  Pneumococcal: 1999, 2017 Prevnar13: 2016 Shingles/Zostavax: 2015  Names of Other Physician/Practitioners you currently use: 1. [facility]  here for primary care 2. Dr. Gershon Crane, eye doctor, last visit 10 years, overdue, patient plans to schedule  3. Dr. Rosaria Ferries, dentist, last visit 2020 q6 months  Patient Care Team: Unk Pinto, MD as PCP - General (Internal Medicine)  Surgical: He  has a past surgical history that includes Appendectomy (1960); Ankle surgery (Left, 1983); Tonsillectomy (1958); Knee arthroscopy (Left, 1986, 1987); Patellar tendon repair (Right, 11/2016); Mouth surgery (2012); Colonoscopy; and Polypectomy. Family His family history includes Colon cancer (age of onset: 8) in his maternal uncle; Colon cancer (age of onset: 37) in his mother; Colon polyps in his mother. Social history  He reports that he has been smoking cigars. He has never used smokeless tobacco. He reports current alcohol use of about 10.0 standard drinks of alcohol per week. He reports that he does not use drugs.  MEDICARE WELLNESS OBJECTIVES: Physical activity: Current Exercise Habits: Home exercise routine, Type of exercise: walking, Time (Minutes): 45, Frequency (Times/Week): 3, Weekly Exercise (Minutes/Week): 135, Intensity: Mild, Exercise limited by: None identified Cardiac risk factors: Cardiac Risk Factors include: advanced age (>65men, >73 women);hypertension;male gender;dyslipidemia;smoking/ tobacco exposure Depression/mood screen:   Depression screen [facility] 2/9 08/24/2019  Decreased Interest 0  Down, Depressed, Hopeless 0  PHQ - 2 Score 0    ADLs:  In your present state of health, do you have any difficulty performing  the following activities: 08/24/2019 10/21/2018  Hearing? N N  Vision? N N  Difficulty concentrating or making decisions? N N  Walking or climbing stairs? N N  Dressing or bathing? N N  Doing errands, shopping? N N  Some recent data might be hidden     Cognitive Testing  Alert? Yes  Normal Appearance?Yes  Oriented to person? Yes  Place? Yes   Time? Yes  Recall of three objects?  Yes  Can perform simple  calculations? Yes  Displays appropriate judgment?Yes  Can read the correct time from a watch face?Yes  EOL planning: Does Patient Have a Medical Advance Directive?: Yes Type of Advance Directive: Healthcare Power of Attorney, Living will Does patient want to make changes to medical advance directive?: No - Patient declined Copy of Healthcare Power of Attorney in Chart?: No - copy requested   Objective:   Today's Vitals   08/24/19 1105  BP: 140/82  Pulse: (!) 53  Temp: (!) 97.5 F (36.4 C)  SpO2: 97%  Weight: 206 lb (93.4 kg)  Height: 6\' 1"  (1.854 m)   Body mass index is 27.18 kg/m.  General appearance: alert, no distress, WD/WN, male HEENT: normocephalic, sclerae anicteric, TMs pearly, nares patent, no discharge or erythema, pharynx normal Oral cavity: MMM, no lesions Neck: supple, no lymphadenopathy, no thyromegaly, no masses Heart: RRR, normal S1, S2, no murmurs Lungs: CTA bilaterally, no wheezes, rhonchi, or rales Abdomen: +bs, soft, non tender, non distended, no masses, no hepatomegaly, no splenomegaly Musculoskeletal: Patient is able to ambulate well. Gait is not  Antalgic.  Extremities: no edema, no cyanosis, no clubbing Pulses: 2+ symmetric, upper and lower extremities, normal cap refill Neurological: alert, oriented x 3, CN2-12 intact, strength normal upper extremities and lower extremities, sensation normal throughout, DTRs 2+ throughout, no cerebellar signs, gait normal Psychiatric: normal affect, behavior normal, pleasant   Medicare Attestation I have personally reviewed: The patient's medical and social history Their use of alcohol, tobacco or illicit drugs Their current medications and supplements The patient's functional ability including ADLs,fall risks, home safety risks, cognitive, and hearing and visual impairment Diet and physical activities Evidence for depression or mood disorders  The patient's weight, height, BMI, and visual acuity have been  recorded in the chart.  I have made referrals, counseling, and provided education to the patient based on review of the above and I have provided the patient with a written personalized care plan for preventive services.     , NP   08/24/2019

## 2019-08-24 ENCOUNTER — Encounter: Payer: Self-pay | Admitting: Internal Medicine

## 2019-08-24 ENCOUNTER — Encounter: Payer: Self-pay | Admitting: Adult Health

## 2019-08-24 ENCOUNTER — Other Ambulatory Visit: Payer: Self-pay

## 2019-08-24 ENCOUNTER — Ambulatory Visit (INDEPENDENT_AMBULATORY_CARE_PROVIDER_SITE_OTHER): Payer: PPO | Admitting: Adult Health

## 2019-08-24 VITALS — BP 140/82 | HR 53 | Temp 97.5°F | Ht 73.0 in | Wt 206.0 lb

## 2019-08-24 DIAGNOSIS — Z8249 Family history of ischemic heart disease and other diseases of the circulatory system: Secondary | ICD-10-CM

## 2019-08-24 DIAGNOSIS — I10 Essential (primary) hypertension: Secondary | ICD-10-CM | POA: Diagnosis not present

## 2019-08-24 DIAGNOSIS — E559 Vitamin D deficiency, unspecified: Secondary | ICD-10-CM

## 2019-08-24 DIAGNOSIS — R6889 Other general symptoms and signs: Secondary | ICD-10-CM

## 2019-08-24 DIAGNOSIS — Z0001 Encounter for general adult medical examination with abnormal findings: Secondary | ICD-10-CM | POA: Diagnosis not present

## 2019-08-24 DIAGNOSIS — E782 Mixed hyperlipidemia: Secondary | ICD-10-CM | POA: Diagnosis not present

## 2019-08-24 DIAGNOSIS — Z0000 Encounter for general adult medical examination without abnormal findings: Secondary | ICD-10-CM

## 2019-08-24 DIAGNOSIS — N138 Other obstructive and reflux uropathy: Secondary | ICD-10-CM

## 2019-08-24 DIAGNOSIS — F17200 Nicotine dependence, unspecified, uncomplicated: Secondary | ICD-10-CM

## 2019-08-24 DIAGNOSIS — E349 Endocrine disorder, unspecified: Secondary | ICD-10-CM

## 2019-08-24 DIAGNOSIS — R7309 Other abnormal glucose: Secondary | ICD-10-CM | POA: Diagnosis not present

## 2019-08-24 DIAGNOSIS — Z79899 Other long term (current) drug therapy: Secondary | ICD-10-CM | POA: Diagnosis not present

## 2019-08-24 DIAGNOSIS — N401 Enlarged prostate with lower urinary tract symptoms: Secondary | ICD-10-CM

## 2019-08-24 NOTE — Patient Instructions (Addendum)
Mr. Brett Perez , Thank you for taking time to come for your Medicare Wellness Visit. I appreciate your ongoing commitment to your health goals. Please review the following plan we discussed and let me know if I can assist you in the future.   These are the goals we discussed: Goals    . Blood Pressure < 130/80    . Exercise 150 min/wk Moderate Activity    . Quit Smoking       This is a list of the screening recommended for you and due dates:  Health Maintenance  Topic Date Due  . Colon Cancer Screening  05/17/2024  . Tetanus Vaccine  08/13/2024  . Flu Shot  Completed  .  Hepatitis C: One time screening is recommended by Center for Disease Control  (CDC) for  adults born from [birth year] through 1965.   Completed  . Pneumonia vaccines  Completed    HYPERTENSION INFORMATION  Monitor your blood pressure at home, please keep a record and bring that in with you to your next office visit.   Go to the ER if any CP, SOB, nausea, dizziness, severe HA, changes vision/speech  Testing/Procedures: HOW TO TAKE YOUR BLOOD PRESSURE:  Rest 5 minutes before taking your blood pressure.  Don't smoke or drink caffeinated beverages for at least 30 minutes before.  Take your blood pressure before (not after) you eat.  Sit comfortably with your back supported and both feet on the floor (don't cross your legs).  Elevate your arm to heart level on a table or a desk.  Use the proper sized cuff. It should fit smoothly and snugly around your bare upper arm. There should be enough room to slip a fingertip under the cuff. The bottom edge of the cuff should be 1 inch above the crease of the elbow.  Due to a recent study, SPRINT, we have changed our goal for the systolic or top blood pressure number. Ideally we want your top number at 120.  In the [facility] Trial, 5000 people were randomized to a goal BP of 120 and 5000 people were randomized to a goal BP of less than 140. The patients with the goal BP at 120 had LESS  DEMENTIA, LESS HEART ATTACKS, AND LESS STROKES, AS WELL AS OVERALL DECREASED MORTALITY OR DEATH RATE.   There was astudy that showed taking your blood pressure medications at night decrease cardiovascular events.  However if you are on a fluid pill, please take this in the morning.   If you are willing, our goal BP is the top number of 120.  Your most recent BP: BP: 140/82   Take your medications faithfully as instructed. Maintain a healthy weight. Get at least 150 minutes of aerobic exercise per week. Minimize salt intake. Minimize alcohol intake  DASH Eating Plan DASH stands for "Dietary Approaches to Stop Hypertension." The DASH eating plan is a healthy eating plan that has been shown to reduce high blood pressure (hypertension). Additional health benefits may include reducing the risk of type 2 diabetes mellitus, heart disease, and stroke. The DASH eating plan may also help with weight loss. WHAT DO I NEED TO KNOW ABOUT THE DASH EATING PLAN? For the DASH eating plan, you will follow these general guidelines:  Choose foods with a percent daily value for sodium of less than 5% (as listed on the food label).  Use salt-free seasonings or herbs instead of table salt or sea salt.  Check with your health care provider or pharmacist before  using salt substitutes.  Eat lower-sodium products, often labeled as "lower sodium" or "no salt added."  Eat fresh foods.  Eat more vegetables, fruits, and low-fat dairy products.  Choose whole grains. Look for the word "whole" as the first word in the ingredient list.  Choose fish and skinless chicken or [place] more often than red meat. Limit fish, poultry, and meat to 6 oz (170 g) each day.  Limit sweets, desserts, sugars, and sugary drinks.  Choose heart-healthy fats.  Limit cheese to 1 oz (28 g) per day.  Eat more home-cooked food and less restaurant, buffet, and fast food.  Limit fried foods.  Cook foods using methods other than  frying.  Limit canned vegetables. If you do use them, rinse them well to decrease the sodium.  When eating at a restaurant, ask that your food be prepared with less salt, or no salt if possible. WHAT FOODS CAN I EAT? Seek help from a dietitian for individual calorie needs. Grains Whole grain or whole wheat bread. Brown rice. Whole grain or whole wheat pasta. Quinoa, bulgur, and whole grain cereals. Low-sodium cereals. Corn or whole wheat flour tortillas. Whole grain cornbread. Whole grain crackers. Low-sodium crackers. Vegetables Fresh or frozen vegetables (raw, steamed, roasted, or grilled). Low-sodium or reduced-sodium tomato and vegetable juices. Low-sodium or reduced-sodium tomato sauce and paste. Low-sodium or reduced-sodium canned vegetables.  Fruits All fresh, canned (in natural juice), or frozen fruits. Meat and Other Protein Products Ground beef (85% or leaner), grass-fed beef, or beef trimmed of fat. Skinless chicken or [place]. Ground chicken or [place]. Pork trimmed of fat. All fish and seafood. Eggs. Dried beans, peas, or lentils. Unsalted nuts and seeds. Unsalted canned beans. Dairy Low-fat dairy products, such as skim or 1% milk, 2% or reduced-fat cheeses, low-fat ricotta or cottage cheese, or plain low-fat yogurt. Low-sodium or reduced-sodium cheeses. Fats and Oils Tub margarines without trans fats. Light or reduced-fat mayonnaise and salad dressings (reduced sodium). Avocado. Safflower, olive, or canola oils. Natural peanut or almond butter. Other Unsalted popcorn and pretzels. The items listed above may not be a complete list of recommended foods or beverages. Contact your dietitian for more options. WHAT FOODS ARE NOT RECOMMENDED? Grains White bread. White pasta. White rice. Refined cornbread. Bagels and croissants. Crackers that contain trans fat. Vegetables Creamed or fried vegetables. Vegetables in a cheese sauce. Regular canned vegetables. Regular canned tomato sauce  and paste. Regular tomato and vegetable juices. Fruits Dried fruits. Canned fruit in light or heavy syrup. Fruit juice. Meat and Other Protein Products Fatty cuts of meat. Ribs, chicken wings, bacon, sausage, bologna, salami, chitterlings, fatback, hot dogs, bratwurst, and packaged luncheon meats. Salted nuts and seeds. Canned beans with salt. Dairy Whole or 2% milk, cream, half-and-half, and cream cheese. Whole-fat or sweetened yogurt. Full-fat cheeses or blue cheese. Nondairy creamers and whipped toppings. Processed cheese, cheese spreads, or cheese curds. Condiments Onion and garlic salt, seasoned salt, table salt, and sea salt. Canned and packaged gravies. Worcestershire sauce. Tartar sauce. Barbecue sauce. Teriyaki sauce. Soy sauce, including reduced sodium. Steak sauce. Fish sauce. Oyster sauce. Cocktail sauce. Horseradish. Ketchup and mustard. Meat flavorings and tenderizers. Bouillon cubes. Hot sauce. Tabasco sauce. Marinades. Taco seasonings. Relishes. Fats and Oils Butter, stick margarine, lard, shortening, ghee, and bacon fat. Coconut, palm kernel, or palm oils. Regular salad dressings. Other Pickles and olives. Salted popcorn and pretzels. The items listed above may not be a complete list of foods and beverages to avoid. Contact your dietitian for more  information. WHERE CAN I FIND MORE INFORMATION? National Heart, Lung, and Blood Institute: [employer] Document Released: 06/14/2011 Document Revised: 11/09/2013 Document Reviewed: 04/29/2013 [facility] Patient Information 2015 [place], [place]. This information is not intended to replace advice given to you by your health care provider. Make sure you discuss any questions you have with your health care provider.

## 2019-08-25 LAB — LIPID PANEL
Cholesterol: 188 mg/dL (ref <200)
HDL: 84 mg/dL (ref >=40)
LDL Cholesterol (Calc): 84 mg/dL (calc)
Non-HDL Cholesterol (Calc): 104 mg/dL (calc) (ref <130)
Total CHOL/HDL Ratio: 2.2 (calc) (ref <5.0)
Triglycerides: 108 mg/dL (ref <150)

## 2019-08-25 LAB — CBC WITH DIFFERENTIAL/PLATELET
Absolute Monocytes: 600 cells/uL (ref 200–950)
Basophils Absolute: 30 cells/uL (ref 0–200)
Basophils Relative: 0.5 %
Eosinophils Absolute: 210 cells/uL (ref 15–500)
Eosinophils Relative: 3.5 %
HCT: 45.5 % (ref 38.5–50.0)
Hemoglobin: 15.8 g/dL (ref 13.2–17.1)
Lymphs Abs: 2832 cells/uL (ref 850–3900)
MCH: 31.6 pg (ref 27.0–33.0)
MCHC: 34.7 g/dL (ref 32.0–36.0)
MCV: 91 fL (ref 80.0–100.0)
MPV: 10.4 fL (ref 7.5–12.5)
Monocytes Relative: 10 %
Neutro Abs: 2328 cells/uL (ref 1500–7800)
Neutrophils Relative %: 38.8 %
Platelets: 257 10*3/uL (ref 140–400)
RBC: 5 10*6/uL (ref 4.20–5.80)
RDW: 12.7 % (ref 11.0–15.0)
Total Lymphocyte: 47.2 %
WBC: 6 10*3/uL (ref 3.8–10.8)

## 2019-08-25 LAB — COMPLETE METABOLIC PANEL WITH GFR
AG Ratio: 1.7 (calc) (ref 1.0–2.5)
ALT: 31 U/L (ref 9–46)
AST: 24 U/L (ref 10–35)
Albumin: 4.4 g/dL (ref 3.6–5.1)
Alkaline phosphatase (APISO): 54 U/L (ref 35–144)
BUN: 12 mg/dL (ref 7–25)
CO2: 25 mmol/L (ref 20–32)
Calcium: 9.4 mg/dL (ref 8.6–10.3)
Chloride: 106 mmol/L (ref 98–110)
Creat: 0.91 mg/dL (ref 0.70–1.25)
GFR, Est African American: 100 mL/min/{1.73_m2} (ref >=60)
GFR, Est Non African American: 86 mL/min/{1.73_m2} (ref >=60)
Globulin: 2.6 g/dL (calc) (ref 1.9–3.7)
Glucose, Bld: 91 mg/dL (ref 65–99)
Potassium: 4.2 mmol/L (ref 3.5–5.3)
Sodium: 140 mmol/L (ref 135–146)
Total Bilirubin: 0.6 mg/dL (ref 0.2–1.2)
Total Protein: 7 g/dL (ref 6.1–8.1)

## 2019-08-25 LAB — TSH: TSH: 2.19 mIU/L (ref 0.40–4.50)

## 2019-08-25 LAB — MAGNESIUM: Magnesium: 2.2 mg/dL (ref 1.5–2.5)

## 2019-10-12 ENCOUNTER — Other Ambulatory Visit: Payer: Self-pay | Admitting: Physician Assistant

## 2019-10-12 DIAGNOSIS — E782 Mixed hyperlipidemia: Secondary | ICD-10-CM

## 2019-10-29 DIAGNOSIS — H10413 Chronic giant papillary conjunctivitis, bilateral: Secondary | ICD-10-CM | POA: Diagnosis not present

## 2019-10-29 DIAGNOSIS — H524 Presbyopia: Secondary | ICD-10-CM | POA: Diagnosis not present

## 2019-10-29 DIAGNOSIS — H2513 Age-related nuclear cataract, bilateral: Secondary | ICD-10-CM | POA: Diagnosis not present

## 2019-10-29 DIAGNOSIS — H5203 Hypermetropia, bilateral: Secondary | ICD-10-CM | POA: Diagnosis not present

## 2019-11-18 ENCOUNTER — Ambulatory Visit: Payer: PPO | Admitting: Internal Medicine

## 2019-11-23 ENCOUNTER — Other Ambulatory Visit: Payer: Self-pay

## 2019-11-23 ENCOUNTER — Ambulatory Visit: Payer: PPO | Admitting: Internal Medicine

## 2019-11-23 ENCOUNTER — Encounter: Payer: Self-pay | Admitting: Adult Health Nurse Practitioner

## 2019-11-23 ENCOUNTER — Ambulatory Visit (INDEPENDENT_AMBULATORY_CARE_PROVIDER_SITE_OTHER): Payer: PPO | Admitting: Adult Health Nurse Practitioner

## 2019-11-23 VITALS — BP 124/74 | HR 57 | Temp 97.3°F | Ht 73.0 in | Wt 202.6 lb

## 2019-11-23 DIAGNOSIS — I10 Essential (primary) hypertension: Secondary | ICD-10-CM | POA: Diagnosis not present

## 2019-11-23 DIAGNOSIS — N138 Other obstructive and reflux uropathy: Secondary | ICD-10-CM | POA: Diagnosis not present

## 2019-11-23 DIAGNOSIS — I700 Atherosclerosis of aorta: Secondary | ICD-10-CM | POA: Diagnosis not present

## 2019-11-23 DIAGNOSIS — Z8249 Family history of ischemic heart disease and other diseases of the circulatory system: Secondary | ICD-10-CM | POA: Diagnosis not present

## 2019-11-23 DIAGNOSIS — E349 Endocrine disorder, unspecified: Secondary | ICD-10-CM | POA: Diagnosis not present

## 2019-11-23 DIAGNOSIS — F17200 Nicotine dependence, unspecified, uncomplicated: Secondary | ICD-10-CM

## 2019-11-23 DIAGNOSIS — R7309 Other abnormal glucose: Secondary | ICD-10-CM

## 2019-11-23 DIAGNOSIS — E559 Vitamin D deficiency, unspecified: Secondary | ICD-10-CM

## 2019-11-23 DIAGNOSIS — N401 Enlarged prostate with lower urinary tract symptoms: Secondary | ICD-10-CM | POA: Diagnosis not present

## 2019-11-23 DIAGNOSIS — E782 Mixed hyperlipidemia: Secondary | ICD-10-CM

## 2019-11-23 NOTE — Progress Notes (Signed)
3 MONTH FOLLOW UP  Assessment / Plan   Essential hypertension Continue medication: atenolol 100mg  daily Monitor blood pressure at home; call if consistently over 130/80 Continue DASH diet.   Reminder to go to the ER if any CP, SOB, nausea, dizziness, severe HA, changes vision/speech, left arm numbness and tingling and jaw pain.  Mixed hyperlipidemia Continue medication: rosuvastatin 10mg , fish oil 1,000mg  dailyu Continue low cholesterol diet and exercise.  Check lipid panel.   Aortic Atherosclerosis (HCC) Control blood pressure, lipids and glucose Disscused lifestyle modifications, diet & exercise Continue to monitor  Testosterone deficiency - continue replacement therapy: Gel 20.25mg , 1pump each arm daily.  patient perceives benefit,  check testosterone levels as needed.   Vitamin D deficiency Near goal at recent check; continue to recommend supplementation for goal of 70-100 Defer vitamin D level  Other abnormal glucose Recent A1Cs at goal Discussed diet/exercise, weight management  Defer A1C; check BMP  Medication management CBC, CMP/GFR  Vitamin D deficiency At goal at recent check; continue to recommend supplementation for goal of 60-100 Defer vitamin D level  Smoker Rare cigar smoker Discussed risks associated with tobacco use and advised to reduce or quit Discussed screening, declined related to min use. Will continue to assess. There is an order currently in the system.  BPH with obstruction/lower urinary tract symptoms Doing well at this time No medications Will continue to monitor Defer PSA this check  Family history of ischemic heart disease Control blood pressure, lipids and glucose Disscused lifestyle modifications, diet & exercise Continue to monitor    Over 30 minutes of face to face interview, exam, counseling, chart review, and critical decision making was performed  Future Appointments  Date Time Provider Department Center  06/08/2020   3:00 PM , MD [facility] None  09/20/2020 11:00 AM Lucky Cowboy, NP [facility] None     Subjective:  Brett Perez is a 69 y.o. male who presents for Medicare Annual Wellness Visit and 3 month follow up for HTN, HLD, glucose management, and vitamin D Def.   He is a occasional cigar smoker, has been cutting back, reports only 1 in past three months.  BMI is Body mass index is 26.73 kg/m., he has been working on diet and exercise, walking on treadmill 3 days a week due to recent weight gain.  Wt Readings from Last 3 Encounters:  11/23/19 202 lb 9.6 oz (91.9 kg)  08/24/19 206 lb (93.4 kg)  05/18/19 202 lb (91.6 kg)   He does have BP cuff, hasn't been checking recently due to consistent good control, today their BP is BP: 124/74 He does workout. He denies chest pain, shortness of breath, dizziness.   He is on cholesterol medication (crestor 10 mg daily) and denies myalgias. His cholesterol is at goal. The cholesterol last visit was:   Lab Results  Component Value Date   CHOL 188 08/24/2019   HDL 84 08/24/2019   LDLCALC 84 08/24/2019   TRIG 108 08/24/2019   CHOLHDL 2.2 08/24/2019   He has been working on diet and exercise for glucose management, and denies foot ulcerations, increased appetite, nausea, paresthesia of the feet, polydipsia, polyuria, visual disturbances, vomiting and weight loss. Last A1C in the office was:  Lab Results  Component Value Date   HGBA1C 5.0 05/06/2019   Last GFR Lab Results  Component Value Date   GFRNONAA 86 08/24/2019    Patient is on Vitamin D supplement and at goal:   Lab Results  Component Value Date  VD25OH 67 05/06/2019     He has a history of testosterone deficiency and is on testosterone replacement (gel). Also on zinc supplement. He states that the testosterone helps with his energy, libido, muscle mass. Lab Results  Component Value Date   TESTOSTERONE 372 05/06/2019     Medication Review:  Current Outpatient  Medications (Endocrine & Metabolic):  Marland Kitchen  Testosterone 20.25 MG/ACT (1.62%) GEL, Apply 1 pump to each arm Daily  Current Outpatient Medications (Cardiovascular):  .  atenolol (TENORMIN) 100 MG tablet, Take 1 tablet Daily for BP .  rosuvastatin (CRESTOR) 10 MG tablet, Take 1 tablet Daily for Cholesterol   Current Outpatient Medications (Analgesics):  .  aspirin 81 MG tablet, Take 81 mg by mouth daily.    Current Outpatient Medications (Other):  Marland Kitchen  Cholecalciferol (VITAMIN D PO), Take by mouth. Takes 4000 units daily .  GLUCOSAMINE-CHONDROITIN PO, Take by mouth 2 (two) times daily.  .  Multiple Vitamin (MULTIVITAMIN) tablet, Take 1 tablet by mouth daily. .  Omega-3 Fatty Acids (FISH OIL) 1000 MG CAPS, Take by mouth daily. .  vitamin C (ASCORBIC ACID) 500 MG tablet, Take 500 mg by mouth daily. Marland Kitchen  zinc gluconate 50 MG tablet, 50 mg. Takes 1 tablet weekly.  Allergies: No Known Allergies  Current Problems (verified) has Hyperlipidemia, mixed; Essential hypertension; Vitamin D deficiency; Testosterone deficiency; Abnormal glucose; Medication management; Smoker; and Family history of ischemic heart disease on their problem list.  Screening Tests Immunization History  Administered Date(s) Administered  . Influenza, High Dose Seasonal PF 03/12/2017, 03/27/2018  . Influenza, Seasonal, Injecte, Preservative Fre 06/21/2015  . Influenza-Unspecified 07/01/2014, 04/21/2019  . PFIZER SARS-COV-2 Vaccination 07/28/2019, 08/18/2019  . PPD Test 08/13/2014, 08/30/2015  . Pneumococcal Conjugate-13 08/13/2014  . Pneumococcal Polysaccharide-23 06/14/2016  . Pneumococcal-Unspecified 07/09/1997  . Td 07/09/2004  . Tdap 08/13/2014  . Zoster 06/29/2014   Preventative care: Last colonoscopy: 05/2019, due 2025 CXR 2002, cigar smoker, CXR ordered  US AB 2004  Prior vaccinations: TD or Tdap: 2016  Influenza: 04/2019  Pneumococcal: 1999, 2017 Prevnar13: 2016 Shingles/Zostavax: 2015  Names of Other  Physician/Practitioners you currently use: 1. [facility] here for primary care 2. Dr. Gershon Crane, eye doctor,One week ago o schedule  3. Dr. Rosaria Ferries, dentist, last visit 2021 q6 months  Patient Care Team: Unk Pinto, MD as PCP - General (Internal Medicine)  Surgical: He  has a past surgical history that includes Appendectomy (1960); Ankle surgery (Left, 1983); Tonsillectomy (1958); Knee arthroscopy (Left, 1986, 1987); Patellar tendon repair (Right, 11/2016); Mouth surgery (2012); Colonoscopy; and Polypectomy. Family His family history includes Colon cancer (age of onset: 59) in his maternal uncle; Colon cancer (age of onset: 81) in his mother; Colon polyps in his mother. Social history  He reports that he has been smoking cigars. He has never used smokeless tobacco. He reports current alcohol use of about 10.0 standard drinks of alcohol per week. He reports that he does not use drugs.     Objective:   Today's Vitals   11/23/19 1129  BP: 124/74  Pulse: (!) 57  Temp: (!) 97.3 F (36.3 C)  SpO2: 98%  Weight: 202 lb 9.6 oz (91.9 kg)  Height: 6\' 1"  (1.854 m)   Body mass index is 26.73 kg/m.  General appearance: alert, no distress, WD/WN, male HEENT: normocephalic, sclerae anicteric, TMs pearly, nares patent, no discharge or erythema, pharynx normal Oral cavity: MMM, no lesions Neck: supple, no lymphadenopathy, no thyromegaly, no masses Heart: RRR,  normal S1, S2, no murmurs Lungs: CTA bilaterally, no wheezes, rhonchi, or rales Abdomen: +bs, soft, non tender, non distended, no masses, no hepatomegaly, no splenomegaly Musculoskeletal: Patient is able to ambulate well. Gait is not  Antalgic.  Extremities: no edema, no cyanosis, no clubbing Pulses: 2+ symmetric, upper and lower extremities, normal cap refill Neurological: alert, oriented x 3, CN2-12 intact, strength normal upper extremities and lower extremities, sensation normal throughout,  DTRs 2+ throughout, no cerebellar signs, gait normal Psychiatric: normal affect, behavior normal, pleasant     Elder Negus, NP [facility] 11/23/2019  12:47 PM

## 2019-11-24 LAB — CBC WITH DIFFERENTIAL/PLATELET
Absolute Monocytes: 545 cells/uL (ref 200–950)
Basophils Absolute: 11 cells/uL (ref 0–200)
Basophils Relative: 0.2 %
Eosinophils Absolute: 220 cells/uL (ref 15–500)
Eosinophils Relative: 4 %
HCT: 43.2 % (ref 38.5–50.0)
Hemoglobin: 14.8 g/dL (ref 13.2–17.1)
Lymphs Abs: 2420 cells/uL (ref 850–3900)
MCH: 31.8 pg (ref 27.0–33.0)
MCHC: 34.3 g/dL (ref 32.0–36.0)
MCV: 92.9 fL (ref 80.0–100.0)
MPV: 9.6 fL (ref 7.5–12.5)
Monocytes Relative: 9.9 %
Neutro Abs: 2305 cells/uL (ref 1500–7800)
Neutrophils Relative %: 41.9 %
Platelets: 251 10*3/uL (ref 140–400)
RBC: 4.65 10*6/uL (ref 4.20–5.80)
RDW: 13 % (ref 11.0–15.0)
Total Lymphocyte: 44 %
WBC: 5.5 10*3/uL (ref 3.8–10.8)

## 2019-11-24 LAB — HEMOGLOBIN A1C
Hgb A1c MFr Bld: 5 % of total Hgb (ref <5.7)
Mean Plasma Glucose: 97 (calc)
eAG (mmol/L): 5.4 (calc)

## 2019-11-24 LAB — COMPLETE METABOLIC PANEL WITH GFR
AG Ratio: 1.9 (calc) (ref 1.0–2.5)
ALT: 40 U/L (ref 9–46)
AST: 32 U/L (ref 10–35)
Albumin: 4 g/dL (ref 3.6–5.1)
Alkaline phosphatase (APISO): 59 U/L (ref 35–144)
BUN: 11 mg/dL (ref 7–25)
CO2: 27 mmol/L (ref 20–32)
Calcium: 9.1 mg/dL (ref 8.6–10.3)
Chloride: 106 mmol/L (ref 98–110)
Creat: 0.85 mg/dL (ref 0.70–1.25)
GFR, Est African American: 104 mL/min/{1.73_m2} (ref >=60)
GFR, Est Non African American: 90 mL/min/{1.73_m2} (ref >=60)
Globulin: 2.1 g/dL (calc) (ref 1.9–3.7)
Glucose, Bld: 110 mg/dL — ABNORMAL HIGH (ref 65–99)
Potassium: 4.1 mmol/L (ref 3.5–5.3)
Sodium: 139 mmol/L (ref 135–146)
Total Bilirubin: 0.4 mg/dL (ref 0.2–1.2)
Total Protein: 6.1 g/dL (ref 6.1–8.1)

## 2019-11-24 LAB — LIPID PANEL
Cholesterol: 156 mg/dL (ref <200)
HDL: 74 mg/dL (ref >=40)
LDL Cholesterol (Calc): 60 mg/dL (calc)
Non-HDL Cholesterol (Calc): 82 mg/dL (calc) (ref <130)
Total CHOL/HDL Ratio: 2.1 (calc) (ref <5.0)
Triglycerides: 134 mg/dL (ref <150)

## 2019-11-24 LAB — MAGNESIUM: Magnesium: 2.1 mg/dL (ref 1.5–2.5)

## 2019-11-24 LAB — TESTOSTERONE: Testosterone: 384 ng/dL (ref 250–827)

## 2019-11-24 LAB — VITAMIN D 25 HYDROXY (VIT D DEFICIENCY, FRACTURES): Vit D, 25-Hydroxy: 57 ng/mL (ref 30–100)

## 2020-01-07 ENCOUNTER — Other Ambulatory Visit: Payer: Self-pay | Admitting: Internal Medicine

## 2020-01-07 DIAGNOSIS — E782 Mixed hyperlipidemia: Secondary | ICD-10-CM

## 2020-04-05 ENCOUNTER — Other Ambulatory Visit: Payer: Self-pay | Admitting: Internal Medicine

## 2020-04-05 DIAGNOSIS — E782 Mixed hyperlipidemia: Secondary | ICD-10-CM

## 2020-06-07 ENCOUNTER — Other Ambulatory Visit: Payer: PPO

## 2020-06-07 DIAGNOSIS — Z20822 Contact with and (suspected) exposure to covid-19: Secondary | ICD-10-CM | POA: Diagnosis not present

## 2020-06-08 ENCOUNTER — Encounter: Payer: PPO | Admitting: Internal Medicine

## 2020-06-09 LAB — NOVEL CORONAVIRUS, NAA: SARS-CoV-2, NAA: NOT DETECTED

## 2020-06-09 LAB — SARS-COV-2, NAA 2 DAY TAT

## 2020-06-12 ENCOUNTER — Encounter: Payer: Self-pay | Admitting: Internal Medicine

## 2020-06-12 NOTE — Patient Instructions (Signed)

## 2020-06-12 NOTE — Progress Notes (Signed)
Annual  Screening/Preventative Visit  & Comprehensive Evaluation & Examination      This very nice 69 y.o.  MWM  presents for a Screening /Preventative Visit & comprehensive evaluation and management of multiple medical co-morbidities.  Patient has been followed for HTN, HLD, glucose intolerance  and Vitamin D Deficiency.     HTN predates circa 2006. Patient's BP has been controlled at home.  Today's BP is at goal - 130/76. Patient denies any cardiac symptoms as chest pain, palpitations, shortness of breath, dizziness or ankle swelling.      Patient's hyperlipidemia is controlled with diet and Rosuvastatin. Patient denies myalgias or other medication SE's. Last lipids were at goal:  Lab Results  Component Value Date   CHOL 156 11/23/2019   HDL 74 11/23/2019   LDLCALC 60 11/23/2019   TRIG 134 11/23/2019   CHOLHDL 2.1 11/23/2019       Patient is monitored expectantly for Glucose intolerance  and patient denies reactive hypoglycemic symptoms, visual blurring, diabetic polys or paresthesias. Last A1c was normal & at goal:   Lab Results  Component Value Date   HGBA1C 5.0 11/23/2019     Patient is on replacement with Androgel for  Testosterone Deficiency ("228.87 /2013)and endorses improved stamina &sense of well being.       Finally, patient has history of Vitamin D Deficiency("28" /2008) and last vitamin D was near goal (70-100):   Lab Results  Component Value Date   VD25OH 57 11/23/2019    Current Outpatient Medications on File Prior to Visit  Medication Sig  . aspirin 81 MG tablet Take  daily.   Marland Kitchen atenolol  100 MG tablet Take 1 tablet Daily for BP  . VITAMIN D Take  4000 units daily  . GLUCOSAMINE-CHONDROITIN  Take 2  times daily.   . Multiple Vitamin  Take 1 tablet  daily.  . Omega-3 FISH OIL 1000 MG CAPS Take  daily.  . rosuvastatin  10 MG tablet Take    1 tablet  Daily   . Testosterone 1.62% GEL Apply 1 pump to each arm Daily  . vitamin C  500 MG tablet  Take  daily.  Marland Kitchen zinc 50 MG tablet Takes 1 tablet weekly.    No Known Allergies  Past Medical History:  Diagnosis Date  . Allergy    SEASONAL  . Arthritis   . Hyperlipidemia    DENIES BUT TAKE MEDICATION  . Hypertension   . Other testicular hypofunction   . Vitamin D deficiency    Health Maintenance  Topic Date Due  . COLONOSCOPY  05/17/2024  . TETANUS/TDAP  08/13/2024  . INFLUENZA VACCINE  Completed  . COVID-19 Vaccine  Completed  . Hepatitis C Screening  Completed  . PNA vac Low Risk Adult  Completed   Immunization History  Administered Date(s) Administered  . Fluad Quad(high Dose 65+) 03/28/2020  . Influenza, High Dose Seasonal PF 03/12/2017, 03/27/2018  . Influenza, Seasonal,  06/21/2015  . Influenza-Unspecified 07/01/2014, 04/21/2019  . PFIZER SARS-COV-2 Vaccination 07/28/2019, 08/18/2019, 03/28/2020  . PPD Test 08/13/2014, 08/30/2015  . Pneumococcal Conjugate-13 08/13/2014  . Pneumococcal Polysaccharide-23 06/14/2016  . Pneumococcal-Unspecified 07/09/1997  . Td 07/09/2004  . Tdap 08/13/2014  . Zoster 06/29/2014   Last Colon - 05/18/2019 - Dr Myrtie Neither - Recc 5 yr f/u due Nov 2025  Past Surgical History:  Procedure Laterality Date  . ANKLE SURGERY Left 1983   for bone spur  . APPENDECTOMY  1960  . COLONOSCOPY    .  KNEE ARTHROSCOPY Left 1986, 1987  . MOUTH SURGERY  2012   For implants  . PATELLAR TENDON REPAIR Right 11/2016   [facility]  . POLYPECTOMY    . TONSILLECTOMY  1958   Family History  Problem Relation Age of Onset  . Colon cancer Mother 21  . Colon polyps Mother   . Colon cancer Maternal Uncle 70  . Esophageal cancer Neg Hx   . Rectal cancer Neg Hx   . Stomach cancer Neg Hx    Social History   Socioeconomic History  . Marital status: Married    Spouse name: Mary  . Number of children: 1 son & 1 daughter  Occupational History  . Attorney  Tobacco Use  . Smoking status: Light Tobacco Smoker    Types: Cigars  . Smokeless tobacco: Never  Used  . Tobacco comment: rarely smokes a  cigar  Vaping Use  . Vaping Use: Never used  Substance and Sexual Activity  . Alcohol use: Yes    Alcohol/week: 10.0 standard drinks    Types: 10 Cans of beer per week  . Drug use: No  . Sexual activity: Not on file     ROS Constitutional: Denies fever, chills, weight loss/gain, headaches, insomnia,  night sweats or change in appetite. Does c/o fatigue. Eyes: Denies redness, blurred vision, diplopia, discharge, itchy or watery eyes.  ENT: Denies discharge, congestion, post nasal drip, epistaxis, sore throat, earache, hearing loss, dental pain, Tinnitus, Vertigo, Sinus pain or snoring.  Cardio: Denies chest pain, palpitations, irregular heartbeat, syncope, dyspnea, diaphoresis, orthopnea, PND, claudication or edema Respiratory: denies cough, dyspnea, DOE, pleurisy, hoarseness, laryngitis or wheezing.  Gastrointestinal: Denies dysphagia, heartburn, reflux, water brash, pain, cramps, nausea, vomiting, bloating, diarrhea, constipation, hematemesis, melena, hematochezia, jaundice or hemorrhoids Genitourinary: Denies dysuria, frequency, urgency, nocturia, hesitancy, discharge, hematuria or flank pain Musculoskeletal: Denies arthralgia, myalgia, stiffness, Jt. Swelling, pain, limp or strain/sprain. Denies Falls. Skin: Denies puritis, rash, hives, warts, acne, eczema or change in skin lesion Neuro: No weakness, tremor, incoordination, spasms, paresthesia or pain Psychiatric: Denies confusion, memory loss or sensory loss. Denies Depression. Endocrine: Denies change in weight, skin, hair change, nocturia, and paresthesia, diabetic polys, visual blurring or hyper / hypo glycemic episodes.  Heme/Lymph: No excessive bleeding, bruising or enlarged lymph nodes.  Physical Exam  BP 130/76   Pulse 60   Temp (!) 97.1 F (36.2 C)   Resp 16   Ht 6\' 1"  (1.854 m)   Wt 205 lb (93 kg)   SpO2 97%   BMI 27.05 kg/m   General Appearance: Well nourished and well  groomed and in no apparent distress.  Eyes: PERRLA, EOMs, conjunctiva no swelling or erythema, normal fundi and vessels. Sinuses: No frontal/maxillary tenderness ENT/Mouth: EACs patent / TMs  nl. Nares clear without erythema, swelling, mucoid exudates. Oral hygiene is good. No erythema, swelling, or exudate. Tongue normal, non-obstructing. Tonsils not swollen or erythematous. Hearing normal.  Neck: Supple, thyroid not palpable. No bruits, nodes or JVD. Respiratory: Respiratory effort normal.  BS equal and clear bilateral without rales, rhonci, wheezing or stridor. Cardio: Heart sounds are normal with regular rate and rhythm and no murmurs, rubs or gallops. Peripheral pulses are normal and equal bilaterally without edema. No aortic or femoral bruits. Chest: symmetric with normal excursions and percussion.  Abdomen: Soft, with Nl bowel sounds. Nontender, no guarding, rebound, hernias, masses, or organomegaly.  Lymphatics: Non tender without lymphadenopathy.  Musculoskeletal: Full ROM all peripheral extremities, joint stability, 5/5 strength, and normal gait. Skin:  Warm and dry without rashes, lesions, cyanosis, clubbing or  ecchymosis.  Neuro: Cranial nerves intact, reflexes equal bilaterally. Normal muscle tone, no cerebellar symptoms. Sensation intact.  Pysch: Alert and oriented x 3 with normal affect, insight and judgment appropriate.   Assessment and Plan  1. Annual Preventative/Screening Exam    2. Essential hypertension  - EKG 12-Lead - [place], RETROPERITNL ABD,  LTD - Urinalysis, Routine w reflex microscopic - Microalbumin / creatinine urine ratio - CBC with Differential/Platelet - COMPLETE METABOLIC PANEL WITH GFR - Magnesium - TSH  3. Hyperlipidemia, mixed  - EKG 12-Lead - [place], RETROPERITNL ABD,  LTD - Lipid panel - TSH  4. Abnormal glucose  - EKG 12-Lead - [place], RETROPERITNL ABD,  LTD - Insulin, random  5. Vitamin D deficiency  - VITAMIN D 25 Hydroxy  6. Testosterone  deficiency  - Testosterone  7. Aortic atherosclerosis (HCC)  - EKG 12-Lead - [place], RETROPERITNL ABD,  LTD  8. BPH with obstruction/lower urinary tract symptoms  - PSA  9. Prostate cancer screening  - PSA  10. Screening for colorectal cancer  - POC Hemoccult Bld/Stl   11. Screening for ischemic heart disease  - EKG 12-Lead  12. FHx: heart disease  - EKG 12-Lead - [place], RETROPERITNL ABD,  LTD  13. Smoker  - EKG 12-Lead - [place], RETROPERITNL ABD,  LTD  14. Screening for AAA (aortic abdominal aneurysm)  - [place], RETROPERITNL ABD,  LTD  15. Medication management  - Urinalysis, Routine w reflex microscopic - Microalbumin / creatinine urine ratio - Testosterone - CBC with Differential/Platelet - COMPLETE METABOLIC PANEL WITH GFR - Magnesium - Lipid panel - TSH - Hemoglobin A1c - Insulin, random - VITAMIN D 25 Hydroxyl              Patient was counseled in prudent diet, weight control to achieve/maintain BMI less than 25, BP monitoring, regular exercise and medications as discussed.  Discussed med effects and SE's. Routine screening labs and tests as requested with regular follow-up as recommended. Over 40 minutes of exam, counseling, chart review and high complex critical decision making was performed   Marinus Maw, MD

## 2020-06-13 ENCOUNTER — Ambulatory Visit (INDEPENDENT_AMBULATORY_CARE_PROVIDER_SITE_OTHER): Payer: PPO | Admitting: Internal Medicine

## 2020-06-13 ENCOUNTER — Other Ambulatory Visit: Payer: Self-pay

## 2020-06-13 ENCOUNTER — Other Ambulatory Visit: Payer: Self-pay | Admitting: Internal Medicine

## 2020-06-13 VITALS — BP 130/76 | HR 60 | Temp 97.1°F | Resp 16 | Ht 73.0 in | Wt 205.0 lb

## 2020-06-13 DIAGNOSIS — I10 Essential (primary) hypertension: Secondary | ICD-10-CM

## 2020-06-13 DIAGNOSIS — F17200 Nicotine dependence, unspecified, uncomplicated: Secondary | ICD-10-CM

## 2020-06-13 DIAGNOSIS — Z79899 Other long term (current) drug therapy: Secondary | ICD-10-CM

## 2020-06-13 DIAGNOSIS — E559 Vitamin D deficiency, unspecified: Secondary | ICD-10-CM

## 2020-06-13 DIAGNOSIS — Z136 Encounter for screening for cardiovascular disorders: Secondary | ICD-10-CM

## 2020-06-13 DIAGNOSIS — Z125 Encounter for screening for malignant neoplasm of prostate: Secondary | ICD-10-CM | POA: Diagnosis not present

## 2020-06-13 DIAGNOSIS — E782 Mixed hyperlipidemia: Secondary | ICD-10-CM

## 2020-06-13 DIAGNOSIS — Z8249 Family history of ischemic heart disease and other diseases of the circulatory system: Secondary | ICD-10-CM | POA: Diagnosis not present

## 2020-06-13 DIAGNOSIS — I700 Atherosclerosis of aorta: Secondary | ICD-10-CM

## 2020-06-13 DIAGNOSIS — E349 Endocrine disorder, unspecified: Secondary | ICD-10-CM

## 2020-06-13 DIAGNOSIS — Z1212 Encounter for screening for malignant neoplasm of rectum: Secondary | ICD-10-CM

## 2020-06-13 DIAGNOSIS — N401 Enlarged prostate with lower urinary tract symptoms: Secondary | ICD-10-CM | POA: Diagnosis not present

## 2020-06-13 DIAGNOSIS — R7309 Other abnormal glucose: Secondary | ICD-10-CM

## 2020-06-13 DIAGNOSIS — Z0000 Encounter for general adult medical examination without abnormal findings: Secondary | ICD-10-CM | POA: Diagnosis not present

## 2020-06-13 DIAGNOSIS — Z1211 Encounter for screening for malignant neoplasm of colon: Secondary | ICD-10-CM

## 2020-06-13 DIAGNOSIS — N138 Other obstructive and reflux uropathy: Secondary | ICD-10-CM

## 2020-06-13 DIAGNOSIS — Z0001 Encounter for general adult medical examination with abnormal findings: Secondary | ICD-10-CM

## 2020-06-14 LAB — TSH: TSH: 2.05 mIU/L (ref 0.40–4.50)

## 2020-06-14 LAB — COMPLETE METABOLIC PANEL WITH GFR
AG Ratio: 1.8 (calc) (ref 1.0–2.5)
ALT: 34 U/L (ref 9–46)
AST: 27 U/L (ref 10–35)
Albumin: 4.1 g/dL (ref 3.6–5.1)
Alkaline phosphatase (APISO): 55 U/L (ref 35–144)
BUN: 13 mg/dL (ref 7–25)
CO2: 25 mmol/L (ref 20–32)
Calcium: 9.2 mg/dL (ref 8.6–10.3)
Chloride: 108 mmol/L (ref 98–110)
Creat: 0.9 mg/dL (ref 0.70–1.25)
GFR, Est African American: 101 mL/min/{1.73_m2} (ref >=60)
GFR, Est Non African American: 87 mL/min/{1.73_m2} (ref >=60)
Globulin: 2.3 g/dL (calc) (ref 1.9–3.7)
Glucose, Bld: 78 mg/dL (ref 65–99)
Potassium: 4.3 mmol/L (ref 3.5–5.3)
Sodium: 140 mmol/L (ref 135–146)
Total Bilirubin: 0.5 mg/dL (ref 0.2–1.2)
Total Protein: 6.4 g/dL (ref 6.1–8.1)

## 2020-06-14 LAB — VITAMIN D 25 HYDROXY (VIT D DEFICIENCY, FRACTURES): Vit D, 25-Hydroxy: 64 ng/mL (ref 30–100)

## 2020-06-14 LAB — LIPID PANEL
Cholesterol: 158 mg/dL (ref <200)
HDL: 75 mg/dL (ref >=40)
LDL Cholesterol (Calc): 65 mg/dL (calc)
Non-HDL Cholesterol (Calc): 83 mg/dL (calc) (ref <130)
Total CHOL/HDL Ratio: 2.1 (calc) (ref <5.0)
Triglycerides: 94 mg/dL (ref <150)

## 2020-06-14 LAB — URINALYSIS, ROUTINE W REFLEX MICROSCOPIC
Bilirubin Urine: NEGATIVE
Glucose, UA: NEGATIVE
Hgb urine dipstick: NEGATIVE
Ketones, ur: NEGATIVE
Leukocytes,Ua: NEGATIVE
Nitrite: NEGATIVE
Protein, ur: NEGATIVE
Specific Gravity, Urine: 1.023 (ref 1.001–1.03)
pH: 6 (ref 5.0–8.0)

## 2020-06-14 LAB — CBC WITH DIFFERENTIAL/PLATELET
Absolute Monocytes: 586 cells/uL (ref 200–950)
Basophils Absolute: 29 cells/uL (ref 0–200)
Basophils Relative: 0.5 %
Eosinophils Absolute: 110 cells/uL (ref 15–500)
Eosinophils Relative: 1.9 %
HCT: 43.8 % (ref 38.5–50.0)
Hemoglobin: 15.1 g/dL (ref 13.2–17.1)
Lymphs Abs: 2784 cells/uL (ref 850–3900)
MCH: 31.5 pg (ref 27.0–33.0)
MCHC: 34.5 g/dL (ref 32.0–36.0)
MCV: 91.4 fL (ref 80.0–100.0)
MPV: 9.6 fL (ref 7.5–12.5)
Monocytes Relative: 10.1 %
Neutro Abs: 2291 cells/uL (ref 1500–7800)
Neutrophils Relative %: 39.5 %
Platelets: 278 10*3/uL (ref 140–400)
RBC: 4.79 10*6/uL (ref 4.20–5.80)
RDW: 12.8 % (ref 11.0–15.0)
Total Lymphocyte: 48 %
WBC: 5.8 10*3/uL (ref 3.8–10.8)

## 2020-06-14 LAB — MICROALBUMIN / CREATININE URINE RATIO
Creatinine, Urine: 152 mg/dL (ref 20–320)
Microalb Creat Ratio: 3 mcg/mg creat (ref <30)
Microalb, Ur: 0.5 mg/dL

## 2020-06-14 LAB — MAGNESIUM: Magnesium: 2.3 mg/dL (ref 1.5–2.5)

## 2020-06-14 LAB — PSA: PSA: 2.66 ng/mL (ref <=4.0)

## 2020-06-14 LAB — HEMOGLOBIN A1C
Hgb A1c MFr Bld: 5.1 % of total Hgb (ref <5.7)
Mean Plasma Glucose: 100 mg/dL
eAG (mmol/L): 5.5 mmol/L

## 2020-06-14 LAB — INSULIN, RANDOM: Insulin: 7.4 u[IU]/mL

## 2020-06-14 LAB — TESTOSTERONE: Testosterone: 351 ng/dL (ref 250–827)

## 2020-06-14 NOTE — Progress Notes (Signed)
========================================================== ==========================================================  -    PSA - In Normal Range - OK  ==========================================================  -  Testosterone Level  - Normal  ==========================================================  -  Chol = 158 - and LDL 65 - Both  Excellent   - Very low risk for Heart Attack  / Stroke ========================================================  - A1c - Normal - Great - No Diabetes ==========================================================  -  Vitamin D = 64 - Excellent  ==========================================================  -  All Else - CBC - Kidneys - U/A - Electrolytes - Liver - Magnesium & Thyroid    - all  Normal / OK ==========================================================   - Keep up the [place] Work   ! ==========================================================

## 2020-06-30 ENCOUNTER — Other Ambulatory Visit: Payer: Self-pay | Admitting: Internal Medicine

## 2020-06-30 DIAGNOSIS — E782 Mixed hyperlipidemia: Secondary | ICD-10-CM

## 2020-07-20 ENCOUNTER — Other Ambulatory Visit: Payer: Self-pay | Admitting: Internal Medicine

## 2020-08-15 ENCOUNTER — Other Ambulatory Visit: Payer: Self-pay

## 2020-08-15 DIAGNOSIS — Z1211 Encounter for screening for malignant neoplasm of colon: Secondary | ICD-10-CM

## 2020-08-15 LAB — POC HEMOCCULT BLD/STL (HOME/3-CARD/SCREEN)
Card #2 Fecal Occult Blod, POC: NEGATIVE
Card #3 Fecal Occult Blood, POC: NEGATIVE
Fecal Occult Blood, POC: NEGATIVE

## 2020-09-20 ENCOUNTER — Ambulatory Visit: Payer: PPO | Admitting: Adult Health Nurse Practitioner

## 2020-10-12 NOTE — Progress Notes (Signed)
MEDICARE ANNUAL WELLNESS VISIT AND FOLLOW UP Assessment:    Encounter for Medicare annual wellness exam Due annually  Essential hypertension Continue medication Monitor blood pressure at home; call if consistently over 130/80 Continue DASH diet.   Reminder to go to the ER if any CP, SOB, nausea, dizziness, severe HA, changes vision/speech, left arm numbness and tingling and jaw pain.  Testosterone deficiency - continue replacement therapy, patient perceives benefit, check testosterone levels as needed.   Vitamin D deficiency Near goal at recent check; continue to recommend supplementation for goal of 70-100 Defer vitamin D level  Other abnormal glucose Recent A1Cs at goal Discussed diet/exercise, weight management  Defer A1C; check CMP  Medication management CBC, CMP/GFR  Mixed hyperlipidemia Continue medication Continue low cholesterol diet and exercise.  Check lipid panel.   Vitamin D deficiency At goal at recent check; continue to recommend supplementation for goal of 60-100 Defer vitamin D level  Smoker Rare cigar smoker Discussed risks associated with tobacco use and advised to reduce or quit Patient is not ready to do so, but advised to consider strongly  Chronic pain left knee Mild sx, but with possible medial compartment arthritis Check xray; will refer back to [facility] if needed, remotely established Continue PRN NSAID   Over 30 minutes of exam, counseling, chart review, and critical decision making was performed  Future Appointments  Date Time Provider Department Center  01/12/2021  4:00 PM Lucky Cowboy, MD [facility] None  06/22/2021  3:00 PM Lucky Cowboy, MD [facility] None  10/16/2021  4:00 PM Judd Gaudier, NP [facility] None     Plan:   During the course of the visit the patient was educated and counseled about appropriate screening and preventive services including:    Pneumococcal vaccine   Influenza vaccine  Prevnar  13  Td vaccine  Screening electrocardiogram  Colorectal cancer screening  Diabetes screening  Glaucoma screening  Nutrition counseling    Subjective:  Brett Perez is a 70 y.o. male who presents for Medicare Annual Wellness Visit and 3 month follow up for HTN, hyperlipidemia, glucose management, and vitamin D Def.   He is a occasional cigar smoker, has been cutting back, reports only 1-2 in the last several months.   He has left knee pain, hx arthroscopies remotely by Dr. [place]/[facility], likely needs replacement at some point, but manages with rare ibuprofen.   BMI is Body mass index is 26.65 kg/m., he has been working on diet and exercise, walking on treadmill 3 days a week, also active in yard.  Wt Readings from Last 3 Encounters:  10/13/20 202 lb (91.6 kg)  06/13/20 205 lb (93 kg)  11/23/19 202 lb 9.6 oz (91.9 kg)   He does have BP cuff, hasn't been checking recently due to consistent good control, today their BP is BP: 120/76 He does workout. He denies chest pain, shortness of breath, dizziness.   He is on cholesterol medication (crestor 10 mg daily) and denies myalgias. His cholesterol is at goal. The cholesterol last visit was:   Lab Results  Component Value Date   CHOL 158 06/13/2020   HDL 75 06/13/2020   LDLCALC 65 06/13/2020   TRIG 94 06/13/2020   CHOLHDL 2.1 06/13/2020   He has been working on diet and exercise for glucose management, and denies foot ulcerations, increased appetite, nausea, paresthesia of the feet, polydipsia, polyuria, visual disturbances, vomiting and weight loss. Last A1C in the office was:  Lab Results  Component Value Date   HGBA1C  5.1 06/13/2020   Last GFR Lab Results  Component Value Date   GFRNONAA 87 06/13/2020    Patient is on Vitamin D supplement and at goal:   Lab Results  Component Value Date   VD25OH 64 06/13/2020     He has a history of testosterone deficiency and is on testosterone replacement (gel). Also on zinc  supplement. He states that the testosterone helps with his energy, libido, muscle mass. Lab Results  Component Value Date   TESTOSTERONE 351 06/13/2020     Medication Review:  Current Outpatient Medications (Endocrine & Metabolic):  Marland Kitchen  Testosterone 20.25 MG/ACT (1.62%) GEL, APPLY 1 PUMP TO EACH ARM DAILY  Current Outpatient Medications (Cardiovascular):  .  rosuvastatin (CRESTOR) 10 MG tablet, TAKE 1 TABLET DAILY .  [START ON 03/09/2021] atenolol (TENORMIN) 50 MG tablet, TAKE 1 TABLET DAILY FOR BLOOD PRESSURE   Current Outpatient Medications (Analgesics):  .  aspirin 81 MG tablet, Take 81 mg by mouth daily.    Current Outpatient Medications (Other):  Marland Kitchen  Cholecalciferol (VITAMIN D PO), Take by mouth. Takes 4000 units daily .  GLUCOSAMINE-CHONDROITIN PO, Take by mouth 2 (two) times daily.  .  Multiple Vitamin (MULTIVITAMIN) tablet, Take 1 tablet by mouth daily. .  Omega-3 Fatty Acids (FISH OIL) 1000 MG CAPS, Take by mouth daily. .  vitamin C (ASCORBIC ACID) 500 MG tablet, Take 500 mg by mouth daily. Marland Kitchen  zinc gluconate 50 MG tablet, 50 mg. Takes 1 tablet weekly.  Allergies: No Known Allergies  Current Problems (verified) has Hyperlipidemia, mixed; Essential hypertension; Vitamin D deficiency; Testosterone deficiency; Abnormal glucose; Medication management; Cigar smoker; and Family history of ischemic heart disease on their problem list.  Screening Tests Immunization History  Administered Date(s) Administered  . Fluad Quad(high Dose 65+) 03/28/2020  . Influenza, High Dose Seasonal PF 03/12/2017, 03/27/2018  . Influenza, Seasonal, Injecte, Preservative Fre 06/21/2015  . Influenza-Unspecified 07/01/2014, 04/21/2019  . PFIZER Comirnaty(Gray Top)Covid-19 Tri-Sucrose Vaccine 10/07/2020  . PFIZER(Purple Top)SARS-COV-2 Vaccination 07/28/2019, 08/18/2019, 03/28/2020  . PPD Test 08/13/2014, 08/30/2015  . Pneumococcal Conjugate-13 08/13/2014  . Pneumococcal Polysaccharide-23 06/14/2016   . Pneumococcal-Unspecified 07/09/1997  . Td 07/09/2004  . Tdap 08/13/2014  . Zoster 06/29/2014    Preventative care: Last colonoscopy: 05/2019, due 2025 [place] AB 2004  Prior vaccinations: TD or Tdap: 2016  Influenza: 03/2020  Pneumococcal: 1999, 2017 Prevnar13: 2016  Shingles/Zostavax: 2015 Covid 19: 4/4, 2021, pfizer  Names of Other Physician/Practitioners you currently use: 1. [facility] here for primary care 2. Dr. Nile Riggs, eye doctor, last visit 2021, has upcoming scheduled, glassses 3. Dr. Dan Humphreys, dentist, last visit 2021 q6 months  Patient Care Team: Lucky Cowboy, MD as PCP - General (Internal Medicine)  Surgical: He  has a past surgical history that includes Appendectomy (1960); Ankle surgery (Left, 1983); Tonsillectomy (1958); Knee arthroscopy (Left, 1986, 1987); Patellar tendon repair (Right, 11/2016); Mouth surgery (2012); Colonoscopy; and Polypectomy. Family His family history includes Colon cancer (age of onset: 30) in his maternal uncle; Colon cancer (age of onset: 89) in his mother; Colon polyps in his mother. Social history  He reports that he has been smoking cigars. He has never used smokeless tobacco. He reports current alcohol use of about 10.0 standard drinks of alcohol per week. He reports that he does not use drugs.  MEDICARE WELLNESS OBJECTIVES: Physical activity: Current Exercise Habits: Home exercise routine, Type of exercise: walking;treadmill, Time (Minutes): 50, Frequency (Times/Week): 3, Weekly Exercise (Minutes/Week): 150, Intensity: Moderate, Exercise  limited by: None identified Cardiac risk factors: Cardiac Risk Factors include: advanced age (>29men, >38 women);male gender;dyslipidemia;hypertension;smoking/ tobacco exposure;family history of premature cardiovascular disease Depression/mood screen:   Depression screen [facility] 2/9 10/13/2020  Decreased Interest 0  Down, Depressed, Hopeless 0  PHQ - 2 Score 0     ADLs:  In your present state of health, do you have any difficulty performing the following activities: 10/13/2020 06/12/2020  Hearing? N N  Vision? N N  Difficulty concentrating or making decisions? N N  Walking or climbing stairs? N N  Dressing or bathing? N N  Doing errands, shopping? N N  Some recent data might be hidden     Cognitive Testing  Alert? Yes  Normal Appearance?Yes  Oriented to person? Yes  Place? Yes   Time? Yes  Recall of three objects?  Yes  Can perform simple calculations? Yes  Displays appropriate judgment?Yes  Can read the correct time from a watch face?Yes  EOL planning: Does Patient Have a Medical Advance Directive?: Yes Type of Advance Directive: Healthcare Power of Attorney,Living will Does patient want to make changes to medical advance directive?: No - Patient declined Copy of Healthcare Power of Attorney in Chart?: No - copy requested   Objective:   Today's Vitals   10/13/20 1615  BP: 120/76  Pulse: (!) 59  Temp: (!) 96.3 F (35.7 C)  SpO2: 96%  Weight: 202 lb (91.6 kg)   Body mass index is 26.65 kg/m.  General appearance: alert, no distress, WD/WN, male HEENT: normocephalic, sclerae anicteric, TMs pearly, nares patent, no discharge or erythema, pharynx normal Oral cavity: MMM, no lesions Neck: supple, no lymphadenopathy, no thyromegaly, no masses Heart: RRR, normal S1, S2, no murmurs Lungs: CTA bilaterally, no wheezes, rhonchi, or rales Abdomen: +bs, soft, non tender, non distended, no masses, no hepatomegaly, no splenomegaly Musculoskeletal: R knee with reduced patellar motion; mild crepitus with figure 8; no tenderness, effusion or laxity; L knee with crepitus, worse with medial tension, no laxity, no effusion, non-tender; left with mild valgus. Patient is able to ambulate well. Gait is not antalgic.  Extremities: no edema, no cyanosis, no clubbing Pulses: 2+ symmetric, upper and lower extremities, normal cap refill Neurological: alert,  oriented x 3, CN2-12 intact, strength normal upper extremities and lower extremities, sensation normal throughout, DTRs 2+ throughout, no cerebellar signs, gait normal Psychiatric: normal affect, behavior normal, pleasant   Medicare Attestation I have personally reviewed: The patient's medical and social history Their use of alcohol, tobacco or illicit drugs Their current medications and supplements The patient's functional ability including ADLs,fall risks, home safety risks, cognitive, and hearing and visual impairment Diet and physical activities Evidence for depression or mood disorders  The patient's weight, height, BMI, and visual acuity have been recorded in the chart.  I have made referrals, counseling, and provided education to the patient based on review of the above and I have provided the patient with a written personalized care plan for preventive services.     Dan Maker, NP   10/13/2020

## 2020-10-13 ENCOUNTER — Other Ambulatory Visit: Payer: Self-pay

## 2020-10-13 ENCOUNTER — Encounter: Payer: Self-pay | Admitting: Adult Health

## 2020-10-13 ENCOUNTER — Ambulatory Visit (INDEPENDENT_AMBULATORY_CARE_PROVIDER_SITE_OTHER): Payer: PPO | Admitting: Adult Health

## 2020-10-13 VITALS — BP 120/76 | HR 59 | Temp 96.3°F | Wt 202.0 lb

## 2020-10-13 DIAGNOSIS — E349 Endocrine disorder, unspecified: Secondary | ICD-10-CM | POA: Diagnosis not present

## 2020-10-13 DIAGNOSIS — Z79899 Other long term (current) drug therapy: Secondary | ICD-10-CM

## 2020-10-13 DIAGNOSIS — G8929 Other chronic pain: Secondary | ICD-10-CM

## 2020-10-13 DIAGNOSIS — I10 Essential (primary) hypertension: Secondary | ICD-10-CM | POA: Diagnosis not present

## 2020-10-13 DIAGNOSIS — F17290 Nicotine dependence, other tobacco product, uncomplicated: Secondary | ICD-10-CM

## 2020-10-13 DIAGNOSIS — E782 Mixed hyperlipidemia: Secondary | ICD-10-CM | POA: Diagnosis not present

## 2020-10-13 DIAGNOSIS — M25562 Pain in left knee: Secondary | ICD-10-CM

## 2020-10-13 DIAGNOSIS — R6889 Other general symptoms and signs: Secondary | ICD-10-CM | POA: Diagnosis not present

## 2020-10-13 DIAGNOSIS — Z0001 Encounter for general adult medical examination with abnormal findings: Secondary | ICD-10-CM

## 2020-10-13 DIAGNOSIS — Z0000 Encounter for general adult medical examination without abnormal findings: Secondary | ICD-10-CM

## 2020-10-13 DIAGNOSIS — E559 Vitamin D deficiency, unspecified: Secondary | ICD-10-CM

## 2020-10-13 DIAGNOSIS — R7309 Other abnormal glucose: Secondary | ICD-10-CM

## 2020-10-13 MED ORDER — ATENOLOL 50 MG PO TABS: ORAL_TABLET | ORAL | 3 refills | Status: DC

## 2020-10-13 NOTE — Patient Instructions (Addendum)
Brett Perez , Thank you for taking time to come for your Medicare Wellness Visit. I appreciate your ongoing commitment to your health goals. Please review the following plan we discussed and let me know if I can assist you in the future.   These are the goals we discussed: Goals    . Blood Pressure < 130/80    . Exercise 150 min/wk Moderate Activity    . Quit Smoking       This is a list of the screening recommended for you and due dates:  Health Maintenance  Topic Date Due  . Flu Shot  02/06/2021  . Colon Cancer Screening  05/17/2024  . Tetanus Vaccine  08/13/2024  . COVID-19 Vaccine  Completed  .  Hepatitis C: One time screening is recommended by Center for Disease Control  (CDC) for  adults born from [birth year] through 1965.   Completed  . Pneumonia vaccines  Completed  . HPV Vaccine  Aged Out    Please ask insurance about shingrix vaccine -   Please get you knee xray at [address] [facility]   Zoster Vaccine, Recombinant injection What is this medicine? ZOSTER VACCINE (ZOS ter vak SEEN) is a vaccine used to reduce the risk of getting shingles. This vaccine is not used to treat shingles or nerve pain from shingles. This medicine may be used for other purposes; ask your health care provider or pharmacist if you have questions. COMMON BRAND NAME(S): [facility] What should I tell my health care provider before I take this medicine? They need to know if you have any of these conditions:  cancer  immune system problems  an unusual or allergic reaction to Zoster vaccine, other medications, foods, dyes, or preservatives  pregnant or trying to get pregnant  breast-feeding How should I use this medicine? This vaccine is injected into a muscle. It is given by a health care provider. A copy of Vaccine Information Statements will be given before each vaccination. Be sure to read this information carefully each time. This sheet may change often. Talk to your health care  provider about the use of this vaccine in children. This vaccine is not approved for use in children. Overdosage: If you think you have taken too much of this medicine contact a poison control center or emergency room at once. NOTE: This medicine is only for you. Do not share this medicine with others. What if I miss a dose? Keep appointments for follow-up (booster) doses. It is important not to miss your dose. Call your health care provider if you are unable to keep an appointment. What may interact with this medicine?  medicines that suppress your immune system  medicines to treat cancer  steroid medicines like prednisone or cortisone This list may not describe all possible interactions. Give your health care provider a list of all the medicines, herbs, non-prescription drugs, or dietary supplements you use. Also tell them if you smoke, drink alcohol, or use illegal drugs. Some items may interact with your medicine. What should I watch for while using this medicine? Visit your health care provider regularly. This vaccine, like all vaccines, may not fully protect everyone. What side effects may I notice from receiving this medicine? Side effects that you should report to your doctor or health care professional as soon as possible:  allergic reactions (skin rash, itching or hives; swelling of the face, lips, or tongue)  trouble breathing Side effects that usually do not require medical attention (report these  to your doctor or health care professional if they continue or are bothersome):  chills  headache  fever  nausea  pain, redness, or irritation at site where injected  tiredness  vomiting This list may not describe all possible side effects. Call your doctor for medical advice about side effects. You may report side effects to FDA at 1-800-FDA-1088. Where should I keep my medicine? This vaccine is only given by a health care provider. It will not be stored at home. NOTE:  This sheet is a summary. It may not cover all possible information. If you have questions about this medicine, talk to your doctor, pharmacist, or health care provider.  2021 Elsevier/Gold Standard (2019-07-31 16:23:07)

## 2020-10-14 LAB — COMPLETE METABOLIC PANEL WITH GFR
AG Ratio: 1.7 (calc) (ref 1.0–2.5)
ALT: 46 U/L (ref 9–46)
AST: 29 U/L (ref 10–35)
Albumin: 4.3 g/dL (ref 3.6–5.1)
Alkaline phosphatase (APISO): 58 U/L (ref 35–144)
BUN: 17 mg/dL (ref 7–25)
CO2: 27 mmol/L (ref 20–32)
Calcium: 9.8 mg/dL (ref 8.6–10.3)
Chloride: 103 mmol/L (ref 98–110)
Creat: 1 mg/dL (ref 0.70–1.25)
GFR, Est African American: 89 mL/min/{1.73_m2} (ref >=60)
GFR, Est Non African American: 76 mL/min/{1.73_m2} (ref >=60)
Globulin: 2.5 g/dL (calc) (ref 1.9–3.7)
Glucose, Bld: 76 mg/dL (ref 65–99)
Potassium: 4.4 mmol/L (ref 3.5–5.3)
Sodium: 139 mmol/L (ref 135–146)
Total Bilirubin: 0.5 mg/dL (ref 0.2–1.2)
Total Protein: 6.8 g/dL (ref 6.1–8.1)

## 2020-10-14 LAB — CBC WITH DIFFERENTIAL/PLATELET
Absolute Monocytes: 740 cells/uL (ref 200–950)
Basophils Absolute: 30 cells/uL (ref 0–200)
Basophils Relative: 0.4 %
Eosinophils Absolute: 222 cells/uL (ref 15–500)
Eosinophils Relative: 3 %
HCT: 44.9 % (ref 38.5–50.0)
Hemoglobin: 15.4 g/dL (ref 13.2–17.1)
Lymphs Abs: 4033 cells/uL — ABNORMAL HIGH (ref 850–3900)
MCH: 31.5 pg (ref 27.0–33.0)
MCHC: 34.3 g/dL (ref 32.0–36.0)
MCV: 91.8 fL (ref 80.0–100.0)
MPV: 10 fL (ref 7.5–12.5)
Monocytes Relative: 10 %
Neutro Abs: 2375 cells/uL (ref 1500–7800)
Neutrophils Relative %: 32.1 %
Platelets: 265 10*3/uL (ref 140–400)
RBC: 4.89 10*6/uL (ref 4.20–5.80)
RDW: 13.2 % (ref 11.0–15.0)
Total Lymphocyte: 54.5 %
WBC: 7.4 10*3/uL (ref 3.8–10.8)

## 2020-10-14 LAB — LIPID PANEL
Cholesterol: 165 mg/dL (ref <200)
HDL: 78 mg/dL (ref >=40)
LDL Cholesterol (Calc): 68 mg/dL (calc)
Non-HDL Cholesterol (Calc): 87 mg/dL (calc) (ref <130)
Total CHOL/HDL Ratio: 2.1 (calc) (ref <5.0)
Triglycerides: 109 mg/dL (ref <150)

## 2020-10-14 LAB — TSH: TSH: 1.65 mIU/L (ref 0.40–4.50)

## 2020-10-14 LAB — MAGNESIUM: Magnesium: 2.3 mg/dL (ref 1.5–2.5)

## 2020-10-18 ENCOUNTER — Other Ambulatory Visit: Payer: Self-pay

## 2020-10-18 ENCOUNTER — Ambulatory Visit
Admission: RE | Admit: 2020-10-18 | Discharge: 2020-10-18 | Disposition: A | Discharge status: Home / Self Care | Payer: PPO | Source: Ambulatory Visit | Attending: Adult Health | Admitting: Adult Health

## 2020-10-18 DIAGNOSIS — G8929 Other chronic pain: Secondary | ICD-10-CM

## 2020-10-18 DIAGNOSIS — M25562 Pain in left knee: Secondary | ICD-10-CM | POA: Diagnosis not present

## 2020-10-20 ENCOUNTER — Encounter: Payer: Self-pay | Admitting: Adult Health

## 2020-10-20 DIAGNOSIS — M1712 Unilateral primary osteoarthritis, left knee: Secondary | ICD-10-CM | POA: Insufficient documentation

## 2020-11-01 DIAGNOSIS — H524 Presbyopia: Secondary | ICD-10-CM | POA: Diagnosis not present

## 2020-11-01 DIAGNOSIS — H5203 Hypermetropia, bilateral: Secondary | ICD-10-CM | POA: Diagnosis not present

## 2020-11-01 DIAGNOSIS — H2513 Age-related nuclear cataract, bilateral: Secondary | ICD-10-CM | POA: Diagnosis not present

## 2020-12-29 ENCOUNTER — Other Ambulatory Visit: Payer: Self-pay | Admitting: Adult Health

## 2020-12-29 DIAGNOSIS — E782 Mixed hyperlipidemia: Secondary | ICD-10-CM

## 2021-01-12 ENCOUNTER — Other Ambulatory Visit: Payer: Self-pay

## 2021-01-12 ENCOUNTER — Ambulatory Visit (INDEPENDENT_AMBULATORY_CARE_PROVIDER_SITE_OTHER): Payer: PPO | Admitting: Internal Medicine

## 2021-01-12 ENCOUNTER — Encounter: Payer: Self-pay | Admitting: Internal Medicine

## 2021-01-12 VITALS — BP 122/70 | HR 57 | Temp 97.7°F | Resp 16 | Ht 72.0 in | Wt 201.4 lb

## 2021-01-12 DIAGNOSIS — R7309 Other abnormal glucose: Secondary | ICD-10-CM

## 2021-01-12 DIAGNOSIS — I10 Essential (primary) hypertension: Secondary | ICD-10-CM

## 2021-01-12 DIAGNOSIS — Z79899 Other long term (current) drug therapy: Secondary | ICD-10-CM

## 2021-01-12 DIAGNOSIS — E349 Endocrine disorder, unspecified: Secondary | ICD-10-CM

## 2021-01-12 DIAGNOSIS — E559 Vitamin D deficiency, unspecified: Secondary | ICD-10-CM

## 2021-01-12 DIAGNOSIS — E782 Mixed hyperlipidemia: Secondary | ICD-10-CM

## 2021-01-12 NOTE — Patient Instructions (Signed)

## 2021-01-12 NOTE — Progress Notes (Signed)
Future Appointments  Date Time Provider Department Center  06/22/2021  3:00 PM Lucky Cowboy, MD [facility] None  10/16/2021  4:00 PM Judd Gaudier, NP [facility] None    History of Present Illness:       This very nice 70 y.o. MWM presents for 6 month follow up with HTN, HLD, Pre-Diabetes and Vitamin D Deficiency.        Patient is treated for HTN (2006) & BP has been controlled at home. Today's BP is at goal - 122/70. Patient has had no complaints of any cardiac type chest pain, palpitations, dyspnea / orthopnea / PND, dizziness, claudication, or dependent edema.       Hyperlipidemia is controlled with diet  & Crestor Patient denies myalgias or other med SE's. Last Lipids were at goal:  Lab Results  Component Value Date   CHOL 165 10/13/2020   HDL 78 10/13/2020   LDLCALC 68 10/13/2020   TRIG 109 10/13/2020   CHOLHDL 2.1 10/13/2020    Also, the patient is monitored expectantly for glucose intolerance and has had no symptoms of reactive hypoglycemia, diabetic polys, paresthesias or visual blurring.  Last A1c was   Lab Results  Component Value Date   HGBA1C 5.1 06/13/2020                                           Patient is on Testosterone replacement with Androgel for  Deficiency ("228.87 /2013)  and endorses improved stamina & sense of well being.    \                                                     Further, the patient also has history of Vitamin D Deficiency and supplements vitamin D without any suspected side-effects. Last vitamin D was at goal:  Lab Results  Component Value Date   VD25OH 64 06/13/2020     Current Outpatient Medications on File Prior to Visit  Medication Sig   aspirin 81 MG tablet Take 81 mg by mouth daily.    [START ON 03/09/2021] atenolol (TENORMIN) 50 MG tablet TAKE 1 TABLET DAILY FOR BLOOD PRESSURE   Cholecalciferol (VITAMIN D PO) Take by mouth. Takes 4000 units daily   GLUCOSAMINE-CHONDROITIN PO Take by mouth 2 (two) times daily.     Multiple Vitamin (MULTIVITAMIN) tablet Take 1 tablet by mouth daily.   Omega-3 Fatty Acids (FISH OIL) 1000 MG CAPS Take by mouth daily.   rosuvastatin (CRESTOR) 10 MG tablet TAKE 1 TABLET BY MOUTH EVERY DAY   Testosterone 20.25 MG/ACT (1.62%) GEL APPLY 1 PUMP TO EACH ARM DAILY   vitamin C (ASCORBIC ACID) 500 MG tablet Take 500 mg by mouth daily.   zinc gluconate 50 MG tablet 50 mg. Takes 1 tablet weekly.   No current facility-administered medications on file prior to visit.     No Known Allergies   PMHx:   Past Medical History:  Diagnosis Date   Allergy    SEASONAL   Arthritis    Hyperlipidemia    DENIES BUT TAKE MEDICATION   Hypertension    Other testicular hypofunction    Vitamin D deficiency      Immunization History  Administered Date(s) Administered   Fluad  Quad(high Dose 65+) 03/28/2020   Influenza, High Dose Seasonal PF 03/12/2017, 03/27/2018   Influenza, Seasonal, Injecte, Preservative Fre 06/21/2015   Influenza-Unspecified 07/01/2014, 04/21/2019   PFIZER Comirnaty Covid-19 Tri-Sucrose Vaccine 10/07/2020   PFIZER(  SARS-COV-2 Vaccination 07/28/2019, 08/18/2019, 03/28/2020   PPD Test 08/13/2014, 08/30/2015   Pneumococcal -13 08/13/2014   Pneumococcal -23 06/14/2016   Pneumococcal-23 07/09/1997   Td 07/09/2004   Tdap 08/13/2014   Zoster, Live 06/29/2014     Past Surgical History:  Procedure Laterality Date   ANKLE SURGERY Left 1983   for bone spur   APPENDECTOMY  1960   COLONOSCOPY     KNEE ARTHROSCOPY Left 1986, 1987   MOUTH SURGERY  2012   For implants   PATELLAR TENDON REPAIR Right 11/2016   [facility]   POLYPECTOMY     TONSILLECTOMY  1958    FHx:    Reviewed / unchanged  SHx:    Reviewed / unchanged   Systems Review:  Constitutional: Denies fever, chills, wt changes, headaches, insomnia, fatigue, night sweats, change in appetite. Eyes: Denies redness, blurred vision, diplopia, discharge, itchy, watery eyes.  ENT: Denies discharge,  congestion, post nasal drip, epistaxis, sore throat, earache, hearing loss, dental pain, tinnitus, vertigo, sinus pain, snoring.  CV: Denies chest pain, palpitations, irregular heartbeat, syncope, dyspnea, diaphoresis, orthopnea, PND, claudication or edema. Respiratory: denies cough, dyspnea, DOE, pleurisy, hoarseness, laryngitis, wheezing.  Gastrointestinal: Denies dysphagia, odynophagia, heartburn, reflux, water brash, abdominal pain or cramps, nausea, vomiting, bloating, diarrhea, constipation, hematemesis, melena, hematochezia  or hemorrhoids. Genitourinary: Denies dysuria, frequency, urgency, nocturia, hesitancy, discharge, hematuria or flank pain. Musculoskeletal: Denies arthralgias, myalgias, stiffness, jt. swelling, pain, limping or strain/sprain.  Skin: Denies pruritus, rash, hives, warts, acne, eczema or change in skin lesion(s). Neuro: No weakness, tremor, incoordination, spasms, paresthesia or pain. Psychiatric: Denies confusion, memory loss or sensory loss. Endo: Denies change in weight, skin or hair change.  Heme/Lymph: No excessive bleeding, bruising or enlarged lymph nodes.  Physical Exam  BP 122/70   Pulse (!) 57   Temp 97.7 F (36.5 C)   Resp 16   Ht 6' (1.829 m)   Wt 201 lb 6.4 oz (91.4 kg)   SpO2 97%   BMI 27.31 kg/m   Appears  well nourished, well groomed  and in no distress.  Eyes: PERRLA, EOMs, conjunctiva no swelling or erythema. Sinuses: No frontal/maxillary tenderness ENT/Mouth: EAC's clear, TM's nl w/o erythema, bulging. Nares clear w/o erythema, swelling, exudates. Oropharynx clear without erythema or exudates. Oral hygiene is good. Tongue normal, non obstructing. Hearing intact.  Neck: Supple. Thyroid not palpable. Car 2+/2+ without bruits, nodes or JVD. Chest: Respirations nl with BS clear & equal w/o rales, rhonchi, wheezing or stridor.  Cor: Heart sounds normal w/ regular rate and rhythm without sig. murmurs, gallops, clicks or rubs. Peripheral pulses  normal and equal  without edema.  Abdomen: Soft & bowel sounds normal. Non-tender w/o guarding, rebound, hernias, masses or organomegaly.  Lymphatics: Unremarkable.  Musculoskeletal: Full ROM all peripheral extremities, joint stability, 5/5 strength and normal gait.  Skin: Warm, dry without exposed rashes, lesions or ecchymosis apparent.  Neuro: Cranial nerves intact, reflexes equal bilaterally. Sensory-motor testing grossly intact. Tendon reflexes grossly intact.  Pysch: Alert & oriented x 3.  Insight and judgement nl & appropriate. No ideations.  Assessment and Plan:  1. Essential hypertension  - Continue medication, monitor blood pressure at home.  - Continue DASH diet.  Reminder to go to the ER if any CP,  SOB,  nausea, dizziness, severe HA, changes vision/speech.   - CBC with Differential/Platelet - COMPLETE METABOLIC PANEL WITH GFR - Magnesium - TSH  2. Hyperlipidemia, mixed  - Continue diet/meds, exercise,& lifestyle modifications.  - Continue monitor periodic cholesterol/liver & renal functions    - Lipid panel - TSH  3. Abnormal glucose  - Continue diet, exercise  - Lifestyle modifications.  - Monitor appropriate labs   - Hemoglobin A1c - Insulin, random  4. Vitamin D deficiency  - Continue supplementation   - VITAMIN D 25 Hydroxy  5. Testosterone deficiency  - Testosterone  6. Medication management  - CBC with Differential/Platelet - COMPLETE METABOLIC PANEL WITH GFR - Magnesium - Lipid panel - TSH - Hemoglobin A1c - Insulin, random - VITAMIN D 25 Hydroxy  - Testosterone        Discussed  regular exercise, BP monitoring, weight control to achieve/maintain BMI less than 25 and discussed med and SE's. Recommended labs to assess and monitor clinical status with further disposition pending results of labs.  I discussed the assessment and treatment plan with the patient. The patient was provided an opportunity to ask questions and all were answered. The  patient agreed with the plan and demonstrated an understanding of the instructions.  I provided over 30 minutes of exam, counseling, chart review and  complex critical decision making.        The patient was advised to call back or seek an in-person evaluation if the symptoms worsen or if the condition fails to improve as anticipated.   Marinus Maw, MD

## 2021-01-13 LAB — INSULIN, RANDOM: Insulin: 5.5 u[IU]/mL

## 2021-01-13 LAB — LIPID PANEL
Cholesterol: 168 mg/dL (ref <200)
HDL: 75 mg/dL (ref >=40)
LDL Cholesterol (Calc): 69 mg/dL (calc)
Non-HDL Cholesterol (Calc): 93 mg/dL (calc) (ref <130)
Total CHOL/HDL Ratio: 2.2 (calc) (ref <5.0)
Triglycerides: 166 mg/dL — ABNORMAL HIGH (ref <150)

## 2021-01-13 LAB — HEMOGLOBIN A1C
Hgb A1c MFr Bld: 5 % of total Hgb (ref <5.7)
Mean Plasma Glucose: 97 mg/dL
eAG (mmol/L): 5.4 mmol/L

## 2021-01-13 LAB — CBC WITH DIFFERENTIAL/PLATELET
Absolute Monocytes: 623 cells/uL (ref 200–950)
Basophils Absolute: 28 cells/uL (ref 0–200)
Basophils Relative: 0.4 %
Eosinophils Absolute: 273 cells/uL (ref 15–500)
Eosinophils Relative: 3.9 %
HCT: 46 % (ref 38.5–50.0)
Hemoglobin: 15.5 g/dL (ref 13.2–17.1)
Lymphs Abs: 3654 cells/uL (ref 850–3900)
MCH: 31.1 pg (ref 27.0–33.0)
MCHC: 33.7 g/dL (ref 32.0–36.0)
MCV: 92.2 fL (ref 80.0–100.0)
MPV: 9.8 fL (ref 7.5–12.5)
Monocytes Relative: 8.9 %
Neutro Abs: 2422 cells/uL (ref 1500–7800)
Neutrophils Relative %: 34.6 %
Platelets: 237 10*3/uL (ref 140–400)
RBC: 4.99 10*6/uL (ref 4.20–5.80)
RDW: 13.2 % (ref 11.0–15.0)
Total Lymphocyte: 52.2 %
WBC: 7 10*3/uL (ref 3.8–10.8)

## 2021-01-13 LAB — COMPLETE METABOLIC PANEL WITH GFR
AG Ratio: 1.9 (calc) (ref 1.0–2.5)
ALT: 45 U/L (ref 9–46)
AST: 34 U/L (ref 10–35)
Albumin: 4.3 g/dL (ref 3.6–5.1)
Alkaline phosphatase (APISO): 65 U/L (ref 35–144)
BUN: 17 mg/dL (ref 7–25)
CO2: 27 mmol/L (ref 20–32)
Calcium: 9.5 mg/dL (ref 8.6–10.3)
Chloride: 104 mmol/L (ref 98–110)
Creat: 0.88 mg/dL (ref 0.70–1.25)
GFR, Est African American: 102 mL/min/{1.73_m2} (ref >=60)
GFR, Est Non African American: 88 mL/min/{1.73_m2} (ref >=60)
Globulin: 2.3 g/dL (calc) (ref 1.9–3.7)
Glucose, Bld: 79 mg/dL (ref 65–99)
Potassium: 4.2 mmol/L (ref 3.5–5.3)
Sodium: 140 mmol/L (ref 135–146)
Total Bilirubin: 0.5 mg/dL (ref 0.2–1.2)
Total Protein: 6.6 g/dL (ref 6.1–8.1)

## 2021-01-13 LAB — VITAMIN D 25 HYDROXY (VIT D DEFICIENCY, FRACTURES): Vit D, 25-Hydroxy: 71 ng/mL (ref 30–100)

## 2021-01-13 LAB — MAGNESIUM: Magnesium: 2.3 mg/dL (ref 1.5–2.5)

## 2021-01-13 LAB — TSH: TSH: 2.02 mIU/L (ref 0.40–4.50)

## 2021-01-13 LAB — TESTOSTERONE: Testosterone: 361 ng/dL (ref 250–827)

## 2021-01-14 NOTE — Progress Notes (Signed)
============================================================ ============================================================  -    Total Chol = 168     and          LDL Chol = 69  - Both  Excellent   -  Very low risk for Heart Attack  / Stroke ========================================================  - A1c - Normal - Great - No Diabetes ! ============================================================ ============================================================  -  Vitamin D = 71   -     Excellent  ============================================================ ============================================================  -  Testosterone is  in normal range  ============================================================ ============================================================  -  All Else - CBC - Kidneys - Electrolytes - Liver - Magnesium & Thyroid    - all  Normal / OK ============================================================ ============================================================  -  Keep up the [place] work  ! ============================================================ ============================================================

## 2021-01-15 ENCOUNTER — Encounter: Payer: Self-pay | Admitting: Internal Medicine

## 2021-06-22 ENCOUNTER — Encounter: Payer: PPO | Admitting: Internal Medicine

## 2021-06-27 ENCOUNTER — Encounter: Payer: Self-pay | Admitting: Adult Health

## 2021-06-27 ENCOUNTER — Ambulatory Visit: Payer: PPO | Admitting: Adult Health

## 2021-06-27 ENCOUNTER — Other Ambulatory Visit: Payer: Self-pay

## 2021-06-27 VITALS — Wt 200.0 lb

## 2021-06-27 DIAGNOSIS — U071 COVID-19: Secondary | ICD-10-CM | POA: Insufficient documentation

## 2021-06-27 MED ORDER — DEXAMETHASONE 1 MG PO TABS: ORAL_TABLET | ORAL | 0 refills | Status: DC

## 2021-06-27 MED ORDER — PROMETHAZINE-DM 6.25-15 MG/5ML PO SYRP: 5.0000 mL | ORAL_SOLUTION | Freq: Four times a day (QID) | ORAL | 1 refills | Status: DC | PRN

## 2021-06-27 NOTE — Progress Notes (Signed)
THIS ENCOUNTER IS A VIRTUAL VISIT DUE TO COVID-19 - PATIENT WAS NOT SEEN IN THE OFFICE.  PATIENT HAS CONSENTED TO VIRTUAL VISIT / TELEMEDICINE VISIT   Virtual Visit via telephone Note  I connected with  Brett Perez on 06/27/2021 by telephone.  I verified that I am speaking with the correct person using two identifiers.    I discussed the limitations of evaluation and management by telemedicine and the availability of in person appointments. The patient expressed understanding and agreed to proceed.  History of Present Illness:  There were no vitals taken for this visit. 70 y.o. patient with htn, cigar smoker (1/month) contacted office reporting URI sx. he tested positive by home rapid test. OV was conducted by telephone to minimize exposure. This patient was vaccinated for covid 19, last 03/2021.   Sx began 2-3 days ago with rhinitis, clear, intermittently mucoid; he reports intermittent sense of mild fever/chills, haven't checked temp yet. He reports very mild cough, denies dyspnea, CP/back aching. He notes mild fatigued sensation. Denies myalgias/arthralgias. Denies GI sx or rash.   Treatments tried so far: none  Exposures: was traveling in [place] this past weekend    Medications  Current Outpatient Medications (Endocrine & Metabolic):    Testosterone 20.25 MG/ACT (1.62%) GEL, APPLY 1 PUMP TO EACH ARM DAILY  Current Outpatient Medications (Cardiovascular):    atenolol (TENORMIN) 50 MG tablet, TAKE 1 TABLET DAILY FOR BLOOD PRESSURE   rosuvastatin (CRESTOR) 10 MG tablet, TAKE 1 TABLET BY MOUTH EVERY DAY   Current Outpatient Medications (Analgesics):    aspirin 81 MG tablet, Take 81 mg by mouth daily.    Current Outpatient Medications (Other):    Cholecalciferol (VITAMIN D PO), Take by mouth. Takes 4000 units daily   GLUCOSAMINE-CHONDROITIN PO, Take by mouth 2 (two) times daily.    Multiple Vitamin (MULTIVITAMIN) tablet, Take 1 tablet by mouth daily.   Omega-3 Fatty Acids (FISH  OIL) 1000 MG CAPS, Take by mouth daily.   vitamin C (ASCORBIC ACID) 500 MG tablet, Take 500 mg by mouth daily.   zinc gluconate 50 MG tablet, 50 mg. Takes 1 tablet weekly.  Allergies: No Known Allergies  Problem list He has Hyperlipidemia, mixed; Essential hypertension; Vitamin D deficiency; Testosterone deficiency; Abnormal glucose; Medication management; Cigar smoker; Family history of ischemic heart disease; Chronic pain of left knee; and Tricompartment osteoarthritis of left knee on their problem list.   Social History:   reports that he has been smoking cigars. He has never used smokeless tobacco. He reports current alcohol use of about 10.0 standard drinks per week. He reports that he does not use drugs.  Observations/Objective:  General : Well sounding patient in no apparent distress HEENT: no hoarseness, no cough for duration of visit Lungs: speaks in complete sentences, no audible wheezing, no apparent distress Neurological: alert, oriented x 3 Psychiatric: pleasant, judgement appropriate   Assessment and Plan:  Covid 19 Covid 19 positive per rapid screening test at home Risk factors include: age, htn, cigar smoker (rare) Symptoms are: mild Due to co morbid conditions and risk factors, discussed antivirals - he declines at this time, but understands we can start to day 5 if needed, would need to be holding rosuvastatin to start. Would qualify for regular dose paxlovid Immue support reviewed  Steroid taper offered and sent in with promethazine DM Take tylenol PRN temp 101+ Flonase, coridcidin HBP as needed for congestion Push hydration Regular ambulation or calf exercises exercises for clot prevention and 81 mg ASA  unless contraindicated Sx supportive therapy suggested Follow up via mychart or telephone if needed Advised patient obtain O2 monitor; present to ED if persistently <90% or with severe dyspnea, CP, fever uncontrolled by tylenol, confusion, sudden decline Should  remain in isolation until at least 5 days from onset of sx, 24-48 hours fever free without tylenol, sx such as cough are improved.    Follow Up Instructions:  I discussed the assessment and treatment plan with the patient. The patient was provided an opportunity to ask questions and all were answered. The patient agreed with the plan and demonstrated an understanding of the instructions.   The patient was advised to call back or seek an in-person evaluation if the symptoms worsen or if the condition fails to improve as anticipated.  I provided 15 minutes of non-face-to-face time during this encounter.   Dan Maker, NP

## 2021-06-29 ENCOUNTER — Encounter: Payer: PPO | Admitting: Internal Medicine

## 2021-07-10 NOTE — Patient Instructions (Signed)

## 2021-07-10 NOTE — Progress Notes (Signed)
Annual  Screening/Preventative Visit  & Comprehensive Evaluation & Examination  Future Appointments  Date Time Provider Department  07/11/2021 10:00 AM Unk Pinto, MD [facility]  10/16/2021                Wellness   4:00 PM Liane Comber, NP [facility]  07/16/2022 10:00 AM Unk Pinto, MD [facility]            This very nice 71 y.o.  MWM presents for a Screening /Preventative Visit & comprehensive evaluation and management of multiple medical co-morbidities.  Patient has been followed for HTN, HLD, Prediabetes and Vitamin D Deficiency.       Today, patient is c/o ongoing aches /pains of his Lt knee.       HTN predates since     . Patient's BP has been controlled at home.  Today's BP is at goal -  128/74. Patient denies any cardiac symptoms as chest pain, palpitations, shortness of breath, dizziness or ankle swelling.       Patient's hyperlipidemia is controlled with diet and medications. Patient denies myalgias or other medication SE's. Last lipids were  at goal :  Lab Results  Component Value Date   CHOL 168 01/12/2021   HDL 75 01/12/2021   LDLCALC 69 01/12/2021   TRIG 166 (H) 01/12/2021   CHOLHDL 2.2 01/12/2021         Patient has hx/o prediabetes since    and patient denies reactive hypoglycemic symptoms, visual blurring, diabetic polys or paresthesias. Last A1c was at goal :   Lab Results  Component Value Date   HGBA1C 5.0 01/12/2021          Finally, patient has history of Vitamin D Deficiency  and last vitamin D was at goal :   Lab Results  Component Value Date   VD25OH 71 01/12/2021     Current Outpatient Medications on File Prior to Visit  Medication Sig   aspirin 81 MG tablet Take  daily.    atenolol 50 MG tablet TAKE 1 TABLET DAILY    VITAMIN D Takes 4000 units daily   GLUCOSAMINE-CHONDROITIN  Take  2  times daily.    Multiple Vitamin  Take 1 tablet daily.   Omega-3 FISH OIL 1000 MG CAPS Take  daily.   rosuvastatin \\10  MG tablet TAKE 1  TABLET  EVERY DAY   Testosterone 1.62 GEL APPLY 1 PUMP TO EACH ARM DAILY   vitamin C  500 MG tablet Take  daily.   zinc g 50 MG tablet Takes 1 tablet weekly.    No Known Allergies   Past Medical History:  Diagnosis Date   Allergy    SEASONAL   Arthritis    Hyperlipidemia    DENIES BUT TAKE MEDICATION   Hypertension    Other testicular hypofunction    Vitamin D deficiency      Health Maintenance  Topic Date Due   Zoster Vaccines- Shingrix (1 of 2) Never done   COLONOSCOPY  05/17/2024   TETANUS/TDAP  08/13/2024   Pneumonia Vaccine 38+ Years old  Completed   INFLUENZA VACCINE  Completed   COVID-19 Vaccine  Completed   Hepatitis C Screening  Completed   HPV VACCINES  Aged Out     Immunization History  Administered Date(s) Administered   Fluad Quad 03/28/2020   Influenza, High Dose  03/12/2017, 03/27/2018, 03/17/2021   Influenza, Seasona 06/21/2015   Influenza 07/01/2014, 04/21/2019   PFIZER Comirnaty Covid-19 Tri-Sucrose Vaccine 10/07/2020   PFIZER  SARS-COV-2 Vaccination 07/28/2019, 08/18/2019, 03/28/2020   PPD Test 08/13/2014, 08/30/2015   Pfizer Covid-19 Vaccine Bivalent Booster \ 03/17/2021   Pneumococcal -13 08/13/2014   Pneumococcal-23 06/14/2016   Pneumococcal-23 07/09/1997   Td 07/09/2004   Tdap 08/13/2014   Zoster, Live 06/29/2014    Last Colon -  05/18/2019 - Dr Loletha Carrow - Recc 5 yr f/u due Nov 2025   Past Surgical History:  Procedure Laterality Date   ANKLE SURGERY Left 1983   for bone spur   APPENDECTOMY  1960   COLONOSCOPY     KNEE ARTHROSCOPY Left 1986, [place]  2012   For implants   PATELLAR TENDON REPAIR Right 11/2016   [place]   POLYPECTOMY     TONSILLECTOMY  1958     Family History  Problem Relation Age of Onset   Colon cancer Mother 90   Colon polyps Mother    Colon cancer Maternal Uncle 28   Esophageal cancer Neg Hx    Rectal cancer Neg Hx    Stomach cancer Neg Hx      Social History   Tobacco Use   Smoking  status: Light Smoker    Types: Cigars   Smokeless tobacco: Never   Tobacco comments:    rarely smokes a  cigar  Vaping Use   Vaping Use: Never used  Substance Use Topics   Alcohol use: Yes    Alcohol/week: 10.0 standard drinks    Types: 10 Cans of beer per week   Drug use: No      ROS Constitutional: Denies fever, chills, weight loss/gain, headaches, insomnia,  night sweats or change in appetite. Does c/o fatigue. Eyes: Denies redness, blurred vision, diplopia, discharge, itchy or watery eyes.  ENT: Denies discharge, congestion, post nasal drip, epistaxis, sore throat, earache, hearing loss, dental pain, Tinnitus, Vertigo, Sinus pain or snoring.  Cardio: Denies chest pain, palpitations, irregular heartbeat, syncope, dyspnea, diaphoresis, orthopnea, PND, claudication or edema Respiratory: denies cough, dyspnea, DOE, pleurisy, hoarseness, laryngitis or wheezing.  Gastrointestinal: Denies dysphagia, heartburn, reflux, water brash, pain, cramps, nausea, vomiting, bloating, diarrhea, constipation, hematemesis, melena, hematochezia, jaundice or hemorrhoids Genitourinary: Denies dysuria, frequency, urgency, nocturia, hesitancy, discharge, hematuria or flank pain Musculoskeletal: Denies arthralgia, myalgia, stiffness, Jt. Swelling, pain, limp or strain/sprain. Denies Falls. Skin: Denies puritis, rash, hives, warts, acne, eczema or change in skin lesion Neuro: No weakness, tremor, incoordination, spasms, paresthesia or pain Psychiatric: Denies confusion, memory loss or sensory loss. Denies Depression. Endocrine: Denies change in weight, skin, hair change, nocturia, and paresthesia, diabetic polys, visual blurring or hyper / hypo glycemic episodes.  Heme/Lymph: No excessive bleeding, bruising or enlarged lymph nodes.   Physical Exam  BP 128/74    Pulse 72    Temp 97.9 F (36.6 C)    Resp 17    Ht 6' (1.829 m)    Wt 205 lb 6.4 oz (93.2 kg)    SpO2 98%    BMI 27.86 kg/m   General Appearance:  Well nourished and well groomed and in no apparent distress.  Eyes: PERRLA, EOMs, conjunctiva no swelling or erythema, normal fundi and vessels. Sinuses: No frontal/maxillary tenderness ENT/Mouth: EACs patent / TMs  nl. Nares clear without erythema, swelling, mucoid exudates. Oral hygiene is good. No erythema, swelling, or exudate. Tongue normal, non-obstructing. Tonsils not swollen or erythematous. Hearing normal.  Neck: Supple, thyroid not palpable. No bruits, nodes or JVD. Respiratory: Respiratory effort normal.  BS equal and clear bilateral without rales, rhonci, wheezing or stridor.  Cardio: Heart sounds are normal with regular rate and rhythm and no murmurs, rubs or gallops. Peripheral pulses are normal and equal bilaterally without edema. No aortic or femoral bruits. Chest: symmetric with normal excursions and percussion.  Abdomen: Soft, with Nl bowel sounds. Nontender, no guarding, rebound, hernias, masses, or organomegaly.  Lymphatics: Non tender without lymphadenopathy.  Musculoskeletal: Full ROM all peripheral extremities, joint stability, 5/5 strength, and normal gait. Skin: Warm and dry without rashes, lesions, cyanosis, clubbing or  ecchymosis.  Neuro: Cranial nerves intact, reflexes equal bilaterally. Normal muscle tone, no cerebellar symptoms. Sensation intact.  Pysch: Alert and oriented X 3 with normal affect, insight and judgment appropriate.   Assessment and Plan   1. Encounter for general adult medical examination with abnormal findings   2. Essential hypertension  - EKG 12-Lead - [place], RETROPERITNL ABD,  LTD - Urinalysis, Routine w reflex microscopic - Microalbumin / creatinine urine ratio - CBC with Differential/Platelet - COMPLETE METABOLIC PANEL WITH GFR - Magnesium - TSH  3. Abnormal glucose  - EKG 12-Lead - [place], RETROPERITNL ABD,  LTD - CBC with Differential/Platelet - COMPLETE METABOLIC PANEL WITH GFR  4. Hyperlipidemia, mixed  - EKG 12-Lead - [place],  RETROPERITNL ABD,  LTD - Lipid panel  5. Vitamin D deficiency   6. Testosterone deficiency  - Testosterone  7. BPH with obstruction/lower urinary tract symptoms  - PSA  8. Prostate cancer screening  - PSA  9. Screening for ischemic heart disease  - EKG 12-Lead  10. FHx: heart disease  - EKG 12-Lead - [place], RETROPERITNL ABD,  LTD  11. Smoker  - EKG 12-Lead - [place], RETROPERITNL ABD,  LTD  12. Screening for AAA (aortic abdominal aneurysm)  - [place], RETROPERITNL ABD,  LTD  13. Family history of ischemic heart disease   14. Aortic atherosclerosis (HCC)  - EKG 12-Lead - [place], RETROPERITNL ABD,  LTD  15. Medication management  - Urinalysis, Routine w reflex microscopic - Microalbumin / creatinine urine ratio - Testosterone - CBC with Differential/Platelet - COMPLETE METABOLIC PANEL WITH GFR - Magnesium - Lipid panel - TS - Hemoglobin A1c - Insulin, random - VITAMIN D 25 Hydroxy   16. Screening for colorectal cancer  - POC Hemoccult Bld/Stl           Patient was counseled in prudent diet, weight control to achieve/maintain BMI less than 25, BP monitoring, regular exercise and medications as discussed.  Discussed med effects and SE's. Routine screening labs and tests as requested with regular follow-up as recommended. Over 40 minutes of exam, counseling, chart review and high complex critical decision making was performed   Kirtland Bouchard, MD

## 2021-07-11 ENCOUNTER — Ambulatory Visit (INDEPENDENT_AMBULATORY_CARE_PROVIDER_SITE_OTHER): Payer: PPO | Admitting: Internal Medicine

## 2021-07-11 ENCOUNTER — Other Ambulatory Visit: Payer: Self-pay

## 2021-07-11 ENCOUNTER — Encounter: Payer: Self-pay | Admitting: Internal Medicine

## 2021-07-11 VITALS — BP 128/74 | HR 72 | Temp 97.9°F | Resp 17 | Ht 72.0 in | Wt 205.4 lb

## 2021-07-11 DIAGNOSIS — Z136 Encounter for screening for cardiovascular disorders: Secondary | ICD-10-CM | POA: Diagnosis not present

## 2021-07-11 DIAGNOSIS — E559 Vitamin D deficiency, unspecified: Secondary | ICD-10-CM

## 2021-07-11 DIAGNOSIS — Z79899 Other long term (current) drug therapy: Secondary | ICD-10-CM | POA: Diagnosis not present

## 2021-07-11 DIAGNOSIS — G8929 Other chronic pain: Secondary | ICD-10-CM

## 2021-07-11 DIAGNOSIS — E782 Mixed hyperlipidemia: Secondary | ICD-10-CM

## 2021-07-11 DIAGNOSIS — I10 Essential (primary) hypertension: Secondary | ICD-10-CM

## 2021-07-11 DIAGNOSIS — Z125 Encounter for screening for malignant neoplasm of prostate: Secondary | ICD-10-CM | POA: Diagnosis not present

## 2021-07-11 DIAGNOSIS — I700 Atherosclerosis of aorta: Secondary | ICD-10-CM

## 2021-07-11 DIAGNOSIS — R7309 Other abnormal glucose: Secondary | ICD-10-CM | POA: Diagnosis not present

## 2021-07-11 DIAGNOSIS — N138 Other obstructive and reflux uropathy: Secondary | ICD-10-CM

## 2021-07-11 DIAGNOSIS — Z13220 Encounter for screening for lipoid disorders: Secondary | ICD-10-CM | POA: Diagnosis not present

## 2021-07-11 DIAGNOSIS — Z8249 Family history of ischemic heart disease and other diseases of the circulatory system: Secondary | ICD-10-CM

## 2021-07-11 DIAGNOSIS — Z0000 Encounter for general adult medical examination without abnormal findings: Secondary | ICD-10-CM | POA: Diagnosis not present

## 2021-07-11 DIAGNOSIS — Z1211 Encounter for screening for malignant neoplasm of colon: Secondary | ICD-10-CM

## 2021-07-11 DIAGNOSIS — E349 Endocrine disorder, unspecified: Secondary | ICD-10-CM | POA: Diagnosis not present

## 2021-07-11 DIAGNOSIS — Z0001 Encounter for general adult medical examination with abnormal findings: Secondary | ICD-10-CM

## 2021-07-11 DIAGNOSIS — N401 Enlarged prostate with lower urinary tract symptoms: Secondary | ICD-10-CM | POA: Diagnosis not present

## 2021-07-11 DIAGNOSIS — F17200 Nicotine dependence, unspecified, uncomplicated: Secondary | ICD-10-CM

## 2021-07-11 DIAGNOSIS — Z1212 Encounter for screening for malignant neoplasm of rectum: Secondary | ICD-10-CM

## 2021-07-12 LAB — CBC WITH DIFFERENTIAL/PLATELET
Absolute Monocytes: 673 cells/uL (ref 200–950)
Basophils Absolute: 33 cells/uL (ref 0–200)
Basophils Relative: 0.5 %
Eosinophils Absolute: 112 cells/uL (ref 15–500)
Eosinophils Relative: 1.7 %
HCT: 44.7 % (ref 38.5–50.0)
Hemoglobin: 15.3 g/dL (ref 13.2–17.1)
Lymphs Abs: 2627 cells/uL (ref 850–3900)
MCH: 31.9 pg (ref 27.0–33.0)
MCHC: 34.2 g/dL (ref 32.0–36.0)
MCV: 93.3 fL (ref 80.0–100.0)
MPV: 9.6 fL (ref 7.5–12.5)
Monocytes Relative: 10.2 %
Neutro Abs: 3155 cells/uL (ref 1500–7800)
Neutrophils Relative %: 47.8 %
Platelets: 298 10*3/uL (ref 140–400)
RBC: 4.79 10*6/uL (ref 4.20–5.80)
RDW: 13.1 % (ref 11.0–15.0)
Total Lymphocyte: 39.8 %
WBC: 6.6 10*3/uL (ref 3.8–10.8)

## 2021-07-12 LAB — COMPLETE METABOLIC PANEL WITH GFR
AG Ratio: 1.6 (calc) (ref 1.0–2.5)
ALT: 47 U/L — ABNORMAL HIGH (ref 9–46)
AST: 32 U/L (ref 10–35)
Albumin: 4.1 g/dL (ref 3.6–5.1)
Alkaline phosphatase (APISO): 58 U/L (ref 35–144)
BUN: 16 mg/dL (ref 7–25)
CO2: 24 mmol/L (ref 20–32)
Calcium: 9.5 mg/dL (ref 8.6–10.3)
Chloride: 108 mmol/L (ref 98–110)
Creat: 0.93 mg/dL (ref 0.70–1.28)
Globulin: 2.5 g/dL (calc) (ref 1.9–3.7)
Glucose, Bld: 87 mg/dL (ref 65–99)
Potassium: 4.2 mmol/L (ref 3.5–5.3)
Sodium: 139 mmol/L (ref 135–146)
Total Bilirubin: 0.6 mg/dL (ref 0.2–1.2)
Total Protein: 6.6 g/dL (ref 6.1–8.1)
eGFR: 88 mL/min/{1.73_m2} (ref >=60)

## 2021-07-12 LAB — LIPID PANEL
Cholesterol: 182 mg/dL (ref <200)
HDL: 76 mg/dL (ref >=40)
LDL Cholesterol (Calc): 82 mg/dL (calc)
Non-HDL Cholesterol (Calc): 106 mg/dL (calc) (ref <130)
Total CHOL/HDL Ratio: 2.4 (calc) (ref <5.0)
Triglycerides: 138 mg/dL (ref <150)

## 2021-07-12 LAB — URINALYSIS, ROUTINE W REFLEX MICROSCOPIC
Bilirubin Urine: NEGATIVE
Glucose, UA: NEGATIVE
Hgb urine dipstick: NEGATIVE
Ketones, ur: NEGATIVE
Leukocytes,Ua: NEGATIVE
Nitrite: NEGATIVE
Protein, ur: NEGATIVE
Specific Gravity, Urine: 1.023 (ref 1.001–1.035)
pH: 5.5 (ref 5.0–8.0)

## 2021-07-12 LAB — VITAMIN D 25 HYDROXY (VIT D DEFICIENCY, FRACTURES): Vit D, 25-Hydroxy: 63 ng/mL (ref 30–100)

## 2021-07-12 LAB — TSH: TSH: 1.94 mIU/L (ref 0.40–4.50)

## 2021-07-12 LAB — MICROALBUMIN / CREATININE URINE RATIO
Creatinine, Urine: 129 mg/dL (ref 20–320)
Microalb, Ur: <0.2 mg/dL

## 2021-07-12 LAB — HEMOGLOBIN A1C
Hgb A1c MFr Bld: 5.4 % of total Hgb (ref <5.7)
Mean Plasma Glucose: 108 mg/dL
eAG (mmol/L): 6 mmol/L

## 2021-07-12 LAB — TESTOSTERONE: Testosterone: 309 ng/dL (ref 250–827)

## 2021-07-12 LAB — MAGNESIUM: Magnesium: 2.4 mg/dL (ref 1.5–2.5)

## 2021-07-12 LAB — PSA: PSA: 1.89 ng/mL (ref <=4.00)

## 2021-07-12 LAB — INSULIN, RANDOM: Insulin: 10.6 u[IU]/mL

## 2021-07-12 NOTE — Progress Notes (Signed)
============================================================ °============================================================ ° °-    PSA - Very low  - Great  ============================================================ ============================================================  - Testosterone in normal range ============================================================ ============================================================  -  Total  Chol =  182   - Great            Excellent   -  Bad / Dangerous LDL  Chol =    82  - Very low risk for Heart Attack  / Stroke ============================================================ ============================================================  -  A1c - Normal - No Diabetes - Great ! ============================================================ ============================================================  -  Vitamin D = Great! ============================================================ ============================================================  -  All Else - CBC - Kidneys - Electrolytes - Liver - Magnesium & Thyroid    - all  Normal / OK ============================================================ ============================================================  -  Keep up the [place] Work   !  ============================================================ ============================================================

## 2021-07-13 NOTE — Progress Notes (Signed)
Lvm fo rpt to call back for his lab results

## 2021-07-14 DIAGNOSIS — M1712 Unilateral primary osteoarthritis, left knee: Secondary | ICD-10-CM | POA: Diagnosis not present

## 2021-08-07 ENCOUNTER — Other Ambulatory Visit: Payer: Self-pay

## 2021-08-07 DIAGNOSIS — Z1212 Encounter for screening for malignant neoplasm of rectum: Secondary | ICD-10-CM

## 2021-08-07 DIAGNOSIS — Z1211 Encounter for screening for malignant neoplasm of colon: Secondary | ICD-10-CM | POA: Diagnosis not present

## 2021-08-07 LAB — POC HEMOCCULT BLD/STL (HOME/3-CARD/SCREEN)
Card #2 Fecal Occult Blod, POC: NEGATIVE
Card #3 Fecal Occult Blood, POC: NEGATIVE
Fecal Occult Blood, POC: NEGATIVE

## 2021-08-28 ENCOUNTER — Other Ambulatory Visit: Payer: Self-pay | Admitting: Internal Medicine

## 2021-10-16 ENCOUNTER — Ambulatory Visit: Payer: PPO | Admitting: Adult Health

## 2021-10-25 ENCOUNTER — Ambulatory Visit (INDEPENDENT_AMBULATORY_CARE_PROVIDER_SITE_OTHER): Payer: PPO | Admitting: Adult Health

## 2021-10-25 ENCOUNTER — Encounter: Payer: Self-pay | Admitting: Adult Health

## 2021-10-25 VITALS — BP 132/76 | HR 61 | Temp 97.5°F | Wt 206.0 lb

## 2021-10-25 DIAGNOSIS — K5790 Diverticulosis of intestine, part unspecified, without perforation or abscess without bleeding: Secondary | ICD-10-CM | POA: Diagnosis not present

## 2021-10-25 DIAGNOSIS — M1712 Unilateral primary osteoarthritis, left knee: Secondary | ICD-10-CM

## 2021-10-25 DIAGNOSIS — R6889 Other general symptoms and signs: Secondary | ICD-10-CM | POA: Diagnosis not present

## 2021-10-25 DIAGNOSIS — I10 Essential (primary) hypertension: Secondary | ICD-10-CM

## 2021-10-25 DIAGNOSIS — E349 Endocrine disorder, unspecified: Secondary | ICD-10-CM

## 2021-10-25 DIAGNOSIS — Z79899 Other long term (current) drug therapy: Secondary | ICD-10-CM

## 2021-10-25 DIAGNOSIS — E559 Vitamin D deficiency, unspecified: Secondary | ICD-10-CM | POA: Diagnosis not present

## 2021-10-25 DIAGNOSIS — E782 Mixed hyperlipidemia: Secondary | ICD-10-CM

## 2021-10-25 DIAGNOSIS — F17290 Nicotine dependence, other tobacco product, uncomplicated: Secondary | ICD-10-CM

## 2021-10-25 DIAGNOSIS — R7309 Other abnormal glucose: Secondary | ICD-10-CM

## 2021-10-25 DIAGNOSIS — Z0001 Encounter for general adult medical examination with abnormal findings: Secondary | ICD-10-CM | POA: Diagnosis not present

## 2021-10-25 DIAGNOSIS — Z0000 Encounter for general adult medical examination without abnormal findings: Secondary | ICD-10-CM

## 2021-10-25 NOTE — Progress Notes (Signed)
MEDICARE ANNUAL WELLNESS VISIT AND FOLLOW UP ?Assessment:  ? ? ?Annual Medicare Wellness Visit ?Due annually  ?Health maintenance reviewed ? ?Essential hypertension ?Continue medication; improved on recheck  ?Monitor blood pressure at home; call if consistently over 130/80 ?Reviewed DASH diet and BP monitoring technique   ?Reminder to go to the ER if any CP, SOB, nausea, dizziness, severe HA, changes vision/speech, left arm numbness and tingling and jaw pain. ? ?Testosterone deficiency ?- continue replacement therapy, patient perceives benefit, check testosterone levels as needed.  ? ?Vitamin D deficiency ?Near goal at recent check; continue to recommend supplementation for goal of 70-100 ?Defer vitamin D level ? ?Other abnormal glucose ?Recent A1Cs at goal ?Discussed diet/exercise, weight management  ?Defer A1C; check CMP ? ?Medication management ?CBC, CMP/GFR ? ?Mixed hyperlipidemia ?Continue medication ?Continue low cholesterol diet and exercise.  ?Check lipid panel.  ? ?Vitamin D deficiency ?At goal at recent check; continue to recommend supplementation for goal of 60-100 ?Defer vitamin D level ? ?Diverticulosis ?Bowel management discussed; high fiber diet encouraged ? ?Smoker ?Rare cigar smoker ?Discussed risks associated with tobacco use and advised to reduce or quit ?Patient is not ready to do so, but advised to consider strongly ? ?Chronic pain left knee/tricompartment arthritis  ?Mild sx, continue chondroitin/glucosamine, PRN NSAID ?Ortho follows PRN ? ?Orders Placed This Encounter  ?Procedures  ? CBC with Differential/Platelet  ? COMPLETE METABOLIC PANEL WITH GFR  ? Magnesium  ? Lipid panel  ? TSH  ? ? ? ? ?Over 30 minutes of exam, counseling, chart review, and critical decision making was performed ? ?Future Appointments  ?Date Time Provider [place]  ?02/08/2022  4:00 PM Unk Pinto, MD [facility] None  ?07/16/2022 10:00 AM Unk Pinto, MD [facility] None  ?10/30/2022  2:00 PM Liane Comber, NP [facility] None  ? ? ? ?Plan:  ? ?During the course of the visit the patient was educated and counseled about appropriate screening and preventive services including:  ? ?Pneumococcal vaccine  ?Influenza vaccine ?Prevnar 13 ?Td vaccine ?Screening electrocardiogram ?Colorectal cancer screening ?Diabetes screening ?Glaucoma screening ?Nutrition counseling  ? ? ?Subjective:  ?Brett Perez is a 71 y.o. male who presents for Medicare Annual Wellness Visit and 3 month follow up for HTN, hyperlipidemia, glucose management, and vitamin D Def.  ? ?He is a occasional cigar smoker, has been cutting back, reports only 1-2 in the last several months.  ? ?He has left knee pain, hx arthroscopies remotely by Dr. [place], likely needs replacement at some point, has seen Dr. Mardelle Matte and had xrays in 07/2021, taking glucosamine/chondroitin, rare ibuprofen, monitoring for now.  ? ?BMI is Body mass index is 27.94 kg/m?., he has been working on diet and exercise, walking on treadmill 3 days a week, also active in yard.  ?Wt Readings from Last 3 Encounters:  ?10/25/21 206 lb (93.4 kg)  ?07/11/21 205 lb 6.4 oz (93.2 kg)  ?06/27/21 200 lb (90.7 kg)  ? ?He does have BP cuff, hasn't been checking recently due to consistent good control, today their BP is BP: 132/76 ?He does workout. He denies chest pain, shortness of breath, dizziness.  ? ?He is on cholesterol medication (crestor 10 mg daily) and denies myalgias. His cholesterol is at goal. The cholesterol last visit was:   ?Lab Results  ?Component Value Date  ? CHOL 182 07/11/2021  ? HDL 76 07/11/2021  ? [place] 82 07/11/2021  ? TRIG 138 07/11/2021  ? CHOLHDL 2.4 07/11/2021  ? ?He has been working on  diet and exercise for glucose management, and denies foot ulcerations, increased appetite, nausea, paresthesia of the feet, polydipsia, polyuria, visual disturbances, vomiting and weight loss. Last A1C in the office was:  ?Lab Results  ?Component Value Date  ? HGBA1C 5.4  07/11/2021  ? ?Last GFR ?Lab Results  ?Component Value Date  ? EGFR 88 07/11/2021  ? ? Patient is on Vitamin D supplement and at goal:   ?Lab Results  ?Component Value Date  ? VD25OH 63 07/11/2021  ?   ?He has a history of testosterone deficiency and is on testosterone replacement (gel). Also on zinc supplement. He states that the testosterone helps with his energy, libido, muscle mass. ?Lab Results  ?Component Value Date  ? TESTOSTERONE 309 07/11/2021  ? ? ? ?Medication Review: ? ?Current Outpatient Medications (Endocrine & Metabolic):  ?  Testosterone 20.25 MG/ACT (1.62%) GEL, APPLY 1 PUMP TO EACH ARM DAILY ?  dexamethasone (DECADRON) 1 MG tablet, Take 3 tabs for 3 days, 2 tabs for 3 days 1 tab for 5 days. Take with food. ? ?Current Outpatient Medications (Cardiovascular):  ?  atenolol (TENORMIN) 100 MG tablet, Take  1 tablet  Daily  for BP                                                          /                           TAKE                       BY MOUTH (Patient taking differently: Take 1/2 tablet daily) ?  rosuvastatin (CRESTOR) 10 MG tablet, TAKE 1 TABLET BY MOUTH EVERY DAY ? ?Current Outpatient Medications (Respiratory):  ?  promethazine-dextromethorphan (PROMETHAZINE-DM) 6.25-15 MG/5ML syrup, Take 5 mLs by mouth 4 (four) times daily as needed for cough. ? ?Current Outpatient Medications (Analgesics):  ?  aspirin 81 MG tablet, Take 81 mg by mouth daily.  ? ? ?Current Outpatient Medications (Other):  ?  Cholecalciferol (VITAMIN D PO), Take by mouth. Takes 4000 units daily ?  GLUCOSAMINE-CHONDROITIN PO, Take by mouth 2 (two) times daily.  ?  Multiple Vitamin (MULTIVITAMIN) tablet, Take 1 tablet by mouth daily. ?  Omega-3 Fatty Acids (FISH OIL) 1000 MG CAPS, Take by mouth daily. ?  vitamin C (ASCORBIC ACID) 500 MG tablet, Take 500 mg by mouth daily. ?  zinc gluconate 50 MG tablet, 50 mg. Takes 1 tablet weekly. ? ?Allergies: ?No Known Allergies ? ?Current Problems (verified) ?has Hyperlipidemia, mixed;  Essential hypertension; Vitamin D deficiency; Testosterone deficiency; Abnormal glucose; Medication management; Cigar smoker; Family history of ischemic heart disease; Chronic pain of left knee; Tricompartment osteoarthritis of left knee; and COVID-19 (06/25/2021) on their problem list. ? ?Screening Tests ?Immunization History  ?Administered Date(s) Administered  ? Fluad Quad(high Dose 65+) 03/28/2020  ? Influenza, High Dose Seasonal PF 03/12/2017, 03/27/2018, 03/17/2021  ? Influenza, Seasonal, Injecte, Preservative Fre 06/21/2015  ? Influenza-Unspecified 07/01/2014, 04/21/2019  ? PFIZER Comirnaty(Gray Top)Covid-19 Tri-Sucrose Vaccine 10/07/2020  ? PFIZER(Purple Top)SARS-COV-2 Vaccination 07/28/2019, 08/18/2019, 03/28/2020  ? PPD Test 08/13/2014, 08/30/2015  ? Pension scheme manager 50yr & up 03/17/2021  ? Pneumococcal Conjugate-13 08/13/2014  ? Pneumococcal Polysaccharide-23 06/14/2016  ? Pneumococcal-Unspecified 07/09/1997  ?  Td 07/09/2004  ? Tdap 08/13/2014  ? Zoster, Live 06/29/2014  ? ?Preventative care: ?Last colonoscopy: 05/2019, due 2025 due to family hx, [place] Dr. Loletha Carrow, diverticula  ?[place] AB 2004 ? ?Prior vaccinations: ?TD or Tdap: 2016  ?Influenza: 03/2020  ?Pneumococcal: 1999, 2017 ?[id number]: 2016  ?Shingles/Zostavax: 2015 ?Covid 19: 4/4, 2021, pfizer ? ?Names of Other Physician/Practitioners you currently use: ?1. [facility] here for primary care ?2. Dr. Gershon Crane, eye doctor, last visit 2022, has upcoming scheduled, glassses ?3. [place], dentist, last visit 2023 q6 months ? ?Patient Care Team: ?Unk Pinto, MD as PCP - General (Internal Medicine) ? ?Surgical: ?He  has a past surgical history that includes Appendectomy (1960); Ankle surgery (Left, 1983); Tonsillectomy (1958); Knee arthroscopy (Left, 1986, 1987); Patellar tendon repair (Right, 11/2016); Mouth surgery (2012); Colonoscopy; and Polypectomy. ?Family ?His family history  includes Colon cancer (age of onset: 67) in his maternal uncle; Colon cancer (age of onset: 109) in his mother; Colon polyps in his mother. ?Social history  ?He reports that he has been smoking cigars. He has never [place]

## 2021-10-25 NOTE — Patient Instructions (Signed)
? ? ?HYPERTENSION INFORMATION ? ?Monitor your blood pressure at home, please keep a record and bring that in with you to your next office visit.  ? ?Go to the ER if any CP, SOB, nausea, dizziness, severe HA, changes vision/speech ? ?Testing/Procedures: ?HOW TO TAKE YOUR BLOOD PRESSURE: ?Rest 5 minutes before taking your blood pressure. ?Don?t smoke or drink caffeinated beverages for at least 30 minutes before. ?Take your blood pressure before (not after) you eat. ?Sit comfortably with your back supported and both feet on the floor (don?t cross your legs). ?Elevate your arm to heart level on a table or a desk. ?Use the proper sized cuff. It should fit smoothly and snugly around your bare upper arm. There should be enough room to slip a fingertip under the cuff. The bottom edge of the cuff should be 1 inch above the crease of the elbow. ? ?Your most recent BP: BP: 132/76  ? ?Take your medications faithfully as instructed. ?Maintain a healthy weight. ?Get at least 150 minutes of aerobic exercise per week. ?Minimize salt intake. ?Minimize alcohol intake ? ?DASH Eating Plan ?DASH stands for "Dietary Approaches to Stop Hypertension." The DASH eating plan is a healthy eating plan that has been shown to reduce high blood pressure (hypertension). Additional health benefits may include reducing the risk of type 2 diabetes mellitus, heart disease, and stroke. The DASH eating plan may also help with weight loss. ?WHAT DO I NEED TO KNOW ABOUT THE DASH EATING PLAN? ?For the DASH eating plan, you will follow these general guidelines: ?Choose foods with a percent daily value for sodium of less than 5% (as listed on the food label). ?Use salt-free seasonings or herbs instead of table salt or sea salt. ?Check with your health care provider or pharmacist before using salt substitutes. ?Eat lower-sodium products, often labeled as "lower sodium" or "no salt added." ?Eat fresh foods. ?Eat more vegetables, fruits, and low-fat dairy  products. ?Choose whole grains. Look for the word "whole" as the first word in the ingredient list. ?Choose fish and skinless chicken or [place] more often than red meat. Limit fish, poultry, and meat to 6 oz (170 g) each day. ?Limit sweets, desserts, sugars, and sugary drinks. ?Choose heart-healthy fats. ?Limit cheese to 1 oz (28 g) per day. ?Eat more home-cooked food and less restaurant, buffet, and fast food. ?Limit fried foods. ?Cook foods using methods other than frying. ?Limit canned vegetables. If you do use them, rinse them well to decrease the sodium. ?When eating at a restaurant, ask that your food be prepared with less salt, or no salt if possible. ?WHAT FOODS CAN I EAT? ?Seek help from a dietitian for individual calorie needs. ?Grains ?Whole grain or whole wheat bread. Brown rice. Whole grain or whole wheat pasta. Quinoa, bulgur, and whole grain cereals. Low-sodium cereals. Corn or whole wheat flour tortillas. Whole grain cornbread. Whole grain crackers. Low-sodium crackers. ?Vegetables ?Fresh or frozen vegetables (raw, steamed, roasted, or grilled). Low-sodium or reduced-sodium tomato and vegetable juices. Low-sodium or reduced-sodium tomato sauce and paste. Low-sodium or reduced-sodium canned vegetables.  ?Fruits ?All fresh, canned (in natural juice), or frozen fruits. ?Meat and Other Protein Products ?Ground beef (85% or leaner), grass-fed beef, or beef trimmed of fat. Skinless chicken or [place]. Ground chicken or [place]. Pork trimmed of fat. All fish and seafood. Eggs. Dried beans, peas, or lentils. Unsalted nuts and seeds. Unsalted canned beans. ?Dairy ?Low-fat dairy products, such as skim or 1% milk, 2% or reduced-fat cheeses, low-fat ricotta  or cottage cheese, or plain low-fat yogurt. Low-sodium or reduced-sodium cheeses. ?Fats and Oils ?Tub margarines without trans fats. Light or reduced-fat mayonnaise and salad dressings (reduced sodium). Avocado. Safflower, olive, or canola oils. Natural peanut  or almond butter. ?Other ?Unsalted popcorn and pretzels. ?The items listed above may not be a complete list of recommended foods or beverages. Contact your dietitian for more options. ?WHAT FOODS ARE NOT RECOMMENDED? ?Grains ?White bread. White pasta. White rice. Refined cornbread. Bagels and croissants. Crackers that contain trans fat. ?Vegetables ?Creamed or fried vegetables. Vegetables in a cheese sauce. Regular canned vegetables. Regular canned tomato sauce and paste. Regular tomato and vegetable juices. ?Fruits ?Dried fruits. Canned fruit in light or heavy syrup. Fruit juice. ?Meat and Other Protein Products ?Fatty cuts of meat. Ribs, chicken wings, bacon, sausage, bologna, salami, chitterlings, fatback, hot dogs, bratwurst, and packaged luncheon meats. Salted nuts and seeds. Canned beans with salt. ?Dairy ?Whole or 2% milk, cream, half-and-half, and cream cheese. Whole-fat or sweetened yogurt. Full-fat cheeses or blue cheese. Nondairy creamers and whipped toppings. Processed cheese, cheese spreads, or cheese curds. ?Condiments ?Onion and garlic salt, seasoned salt, table salt, and sea salt. Canned and packaged gravies. Worcestershire sauce. Tartar sauce. Barbecue sauce. Teriyaki sauce. Soy sauce, including reduced sodium. Steak sauce. Fish sauce. Oyster sauce. Cocktail sauce. Horseradish. Ketchup and mustard. Meat flavorings and tenderizers. Bouillon cubes. Hot sauce. Tabasco sauce. Marinades. Taco seasonings. Relishes. ?Fats and Oils ?Butter, stick margarine, lard, shortening, ghee, and bacon fat. Coconut, palm kernel, or palm oils. Regular salad dressings. ?Other ?Pickles and olives. Salted popcorn and pretzels. ?The items listed above may not be a complete list of foods and beverages to avoid. Contact your dietitian for more information. ?WHERE CAN I FIND MORE INFORMATION? ?[employer], [employer], [employer]: [employer] ?Document Released: 06/14/2011 Document  Revised: 11/09/2013 Document Reviewed: 04/29/2013 ?ExitCare? Patient Information ?2015 [employer]. This information is not intended to replace advice given to you by your health care provider. Make sure you discuss any questions you have with your health care provider. ? ? ? ?

## 2021-10-26 LAB — LIPID PANEL
Cholesterol: 173 mg/dL (ref <200)
HDL: 89 mg/dL (ref >=40)
LDL Cholesterol (Calc): 66 mg/dL (calc)
Non-HDL Cholesterol (Calc): 84 mg/dL (calc) (ref <130)
Total CHOL/HDL Ratio: 1.9 (calc) (ref <5.0)
Triglycerides: 96 mg/dL (ref <150)

## 2021-10-26 LAB — COMPLETE METABOLIC PANEL WITH GFR
AG Ratio: 1.9 (calc) (ref 1.0–2.5)
ALT: 44 U/L (ref 9–46)
AST: 34 U/L (ref 10–35)
Albumin: 4.3 g/dL (ref 3.6–5.1)
Alkaline phosphatase (APISO): 51 U/L (ref 35–144)
BUN: 16 mg/dL (ref 7–25)
CO2: 28 mmol/L (ref 20–32)
Calcium: 9.2 mg/dL (ref 8.6–10.3)
Chloride: 104 mmol/L (ref 98–110)
Creat: 0.96 mg/dL (ref 0.70–1.28)
Globulin: 2.3 g/dL (calc) (ref 1.9–3.7)
Glucose, Bld: 71 mg/dL (ref 65–99)
Potassium: 4.6 mmol/L (ref 3.5–5.3)
Sodium: 141 mmol/L (ref 135–146)
Total Bilirubin: 0.6 mg/dL (ref 0.2–1.2)
Total Protein: 6.6 g/dL (ref 6.1–8.1)
eGFR: 85 mL/min/{1.73_m2} (ref >=60)

## 2021-10-26 LAB — CBC WITH DIFFERENTIAL/PLATELET
Absolute Monocytes: 590 cells/uL (ref 200–950)
Basophils Absolute: 29 cells/uL (ref 0–200)
Basophils Relative: 0.4 %
Eosinophils Absolute: 180 cells/uL (ref 15–500)
Eosinophils Relative: 2.5 %
HCT: 43.1 % (ref 38.5–50.0)
Hemoglobin: 15.4 g/dL (ref 13.2–17.1)
Lymphs Abs: 2959 cells/uL (ref 850–3900)
MCH: 33 pg (ref 27.0–33.0)
MCHC: 35.7 g/dL (ref 32.0–36.0)
MCV: 92.5 fL (ref 80.0–100.0)
MPV: 9.6 fL (ref 7.5–12.5)
Monocytes Relative: 8.2 %
Neutro Abs: 3442 cells/uL (ref 1500–7800)
Neutrophils Relative %: 47.8 %
Platelets: 251 10*3/uL (ref 140–400)
RBC: 4.66 10*6/uL (ref 4.20–5.80)
RDW: 13 % (ref 11.0–15.0)
Total Lymphocyte: 41.1 %
WBC: 7.2 10*3/uL (ref 3.8–10.8)

## 2021-10-26 LAB — TSH: TSH: 2.23 mIU/L (ref 0.40–4.50)

## 2021-10-26 LAB — MAGNESIUM: Magnesium: 2.2 mg/dL (ref 1.5–2.5)

## 2021-11-02 DIAGNOSIS — H5203 Hypermetropia, bilateral: Secondary | ICD-10-CM | POA: Diagnosis not present

## 2021-11-02 DIAGNOSIS — H2513 Age-related nuclear cataract, bilateral: Secondary | ICD-10-CM | POA: Diagnosis not present

## 2021-11-02 DIAGNOSIS — H43812 Vitreous degeneration, left eye: Secondary | ICD-10-CM | POA: Diagnosis not present

## 2021-11-02 DIAGNOSIS — H524 Presbyopia: Secondary | ICD-10-CM | POA: Diagnosis not present

## 2022-01-02 ENCOUNTER — Other Ambulatory Visit: Payer: Self-pay | Admitting: Adult Health

## 2022-01-02 DIAGNOSIS — E782 Mixed hyperlipidemia: Secondary | ICD-10-CM

## 2022-02-07 NOTE — Progress Notes (Signed)
Future Appointments  Date Time Provider Department  06/22/2021              6 mo ov      3:00 PM Lucky Cowboy, MD [facility]  10/16/2021                wellness   4:00 PM  [facility]    History of Present Illness:       This very nice 71 y.o. MWM presents for 6 month follow up with HTN, HLD, Pre-Diabetes and Vitamin D Deficiency.        Patient is treated for HTN (2006) & BP has been controlled at home. Today's BP is  at goal - 130 /88 .    Patient has had no complaints of any cardiac type chest pain, palpitations, dyspnea Pollyann Kennedy /PND, dizziness, claudication, or dependent edema.       Hyperlipidemia is controlled with diet  & Crestor Patient denies myalgias or other med SE's. Last Lipids were at goal :  Lab Results  Component Value Date   CHOL 173 10/25/2021   HDL 89 10/25/2021   LDLCALC 66 10/25/2021   TRIG 96 10/25/2021   CHOLHDL 1.9 10/25/2021     Also, the patient is monitored expectantly for glucose intolerance and has had no symptoms of reactive hypoglycemia, diabetic polys, paresthesias or visual blurring.  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                                                         Further, the patient also has history of Vitamin D Deficiency and supplements vitamin D without any suspected side-effects. Last vitamin D was at goal :  Lab Results  Component Value Date   VD25OH 63 07/11/2021       Current Outpatient Medications:    aspirin 81 MG tablet, Take 81 mg by mouth daily. , Disp: , Rfl:    atenolol (TENORMIN) 100 MG tablet, Take  1 tablet  Daily  for BP                                                          /                           TAKE                       BY MOUTH (Patient taking  differently: Take 1/2 tablet daily), Disp: 90 tablet, Rfl: 3   Cholecalciferol (VITAMIN D PO), Take by mouth. Takes 4000 units daily, Disp: , Rfl:    GLUCOSAMINE-CHONDROITIN PO, Take  by mouth 2 (two) times daily. , Disp: , Rfl:    Multiple Vitamin (MULTIVITAMIN) tablet, Take 1 tablet by mouth daily., Disp: , Rfl:    Omega-3 Fatty Acids (FISH OIL) 1000 MG CAPS, Take by mouth daily., Disp: , Rfl:    rosuvastatin (CRESTOR) 10 MG tablet, TAKE 1 TABLET BY MOUTH EVERY DAY, Disp: 90 tablet, Rfl: 3   Testosterone 20.25 MG/ACT (1.62%) GEL, APPLY 1 PUMP TO EACH ARM DAILY, Disp: 225 g, Rfl: 1   vitamin C (ASCORBIC ACID) 500 MG tablet, Take 500 mg by mouth daily., Disp: , Rfl:    zinc gluconate 50 MG tablet, 50 mg. Takes 1 tablet weekly., Disp: , Rfl:    No Known Allergies   PMHx:   Past Medical History:  Diagnosis Date   Allergy    SEASONAL   Arthritis    Hyperlipidemia    DENIES BUT TAKE MEDICATION   Hypertension    Other testicular hypofunction    Vitamin D deficiency     Immunization History  Administered Date(s) Administered   Fluad Quad(high Dose 65+) 03/28/2020   Influenza, High Dose Seasonal PF 03/12/2017, 03/27/2018   Influenza, Seasonal, Injecte, Preservative Fre 06/21/2015   Influenza-Unspecified 07/01/2014, 04/21/2019   PFIZER Comirnaty Covid-19 Tri-Sucrose Vaccine 10/07/2020   PFIZER(  SARS-COV-2 Vaccination 07/28/2019, 08/18/2019, 03/28/2020   PPD Test 08/13/2014, 08/30/2015   Pneumococcal -13 08/13/2014   Pneumococcal -23 06/14/2016   Pneumococcal-23 07/09/1997   Td 07/09/2004   Tdap 08/13/2014   Zoster, Live 06/29/2014     Past Surgical History:  Procedure Laterality Date   ANKLE SURGERY Left 1983   for bone spur   APPENDECTOMY  1960   COLONOSCOPY     KNEE ARTHROSCOPY Left 1986, 1987   MOUTH SURGERY  2012   For implants   PATELLAR TENDON REPAIR Right 11/2016   [facility]   POLYPECTOMY     TONSILLECTOMY  1958    FHx:    Reviewed / unchanged  SHx:     Reviewed / unchanged    Systems Review:  Constitutional: Denies fever, chills, wt changes, headaches, insomnia, fatigue, night sweats, change in appetite. Eyes: Denies redness, blurred vision, diplopia, discharge, itchy, watery eyes.  ENT: Denies discharge, congestion, post nasal drip, epistaxis, sore throat, earache, hearing loss, dental pain, tinnitus, vertigo, sinus pain, snoring.  CV: Denies chest pain, palpitations, irregular heartbeat, syncope, dyspnea, diaphoresis, orthopnea, PND, claudication or edema. Respiratory: denies cough, dyspnea, DOE, pleurisy, hoarseness, laryngitis, wheezing.  Gastrointestinal: Denies dysphagia, odynophagia, heartburn, reflux, water brash, abdominal pain or cramps, nausea, vomiting, bloating, diarrhea, constipation, hematemesis, melena, hematochezia  or hemorrhoids. Genitourinary: Denies dysuria, frequency, urgency, nocturia, hesitancy, discharge, hematuria or flank pain. Musculoskeletal: Denies arthralgias, myalgias, stiffness, jt. swelling, pain, limping or strain/sprain.  Skin: Denies pruritus, rash, hives, warts, acne, eczema or change in skin lesion(s). Neuro: No weakness, tremor, incoordination, spasms, paresthesia or pain. Psychiatric: Denies confusion, memory loss or sensory loss. Endo: Denies change in weight, skin or hair change.  Heme/Lymph: No excessive bleeding, bruising or enlarged lymph nodes.  Physical Exam  BP 130/88   Pulse 64   Temp 97.9 F (36.6 C)   Resp 17   Ht 6' (1.829 m)   Wt 200 lb 9.6 oz (91 kg)   SpO2 96%   BMI 27.21 kg/m   Appears  well nourished, well groomed  and in no distress.  Eyes: PERRLA, EOMs, conjunctiva no swelling or erythema. Sinuses: No frontal/maxillary tenderness ENT/Mouth: EAC's clear, TM's nl w/o erythema, bulging.  Nares clear w/o erythema, swelling, exudates. Oropharynx clear without erythema or exudates. Oral hygiene is good. Tongue normal, non obstructing. Hearing intact.  Neck: Supple. Thyroid  not palpable. Car 2+/2+ without bruits, nodes or JVD. Chest: Respirations nl with BS clear & equal w/o rales, rhonchi, wheezing or stridor.  Cor: Heart sounds normal w/ regular rate and rhythm without sig. murmurs, gallops, clicks or rubs. Peripheral pulses normal and equal  without edema.  Abdomen: Soft & bowel sounds normal. Non-tender w/o guarding, rebound, hernias, masses or organomegaly.  Lymphatics: Unremarkable.  Musculoskeletal: Full ROM all peripheral extremities, joint stability, 5/5 strength and normal gait.  Skin: Warm, dry without exposed rashes, lesions or ecchymosis apparent.  Neuro: Cranial nerves intact, reflexes equal bilaterally. Sensory-motor testing grossly intact. Tendon reflexes grossly intact.  Pysch: Alert & oriented x 3.  Insight and judgement nl & appropriate. No ideations.  Assessment and Plan:   1. Essential hypertension  - Continue medication, monitor blood pressure at home.  - Continue DASH diet.  Reminder to go to the ER if any CP,  SOB, nausea, dizziness, severe HA, changes vision/speech.   - CBC with Differential/Platelet - COMPLETE METABOLIC PANEL WITH GFR - Magnesium - TSH  2.   Hyperlipidemia, mixed  - Continue diet/meds, exercise,& lifestyle modifications.  - Continue monitor periodic cholesterol/liver & renal functions   - Lipid panel - TSH  3. Abnormal glucose  - Continue diet, exercise  - Lifestyle modifications.  - Monitor appropriate labs   - Hemoglobin A1c - Insulin, random  4. Vitamin D deficiency  - Continue supplementation   - VITAMIN D 25 Hydroxy   5. Medication management  - CBC with Differential/Platelet - COMPLETE METABOLIC PANEL WITH GFR - Magnesium - Lipid panel - TSH - Hemoglobin A1c - Insulin, random - VITAMIN D 25 Hydroxy         Discussed  regular exercise, BP monitoring, weight control to achieve/maintain BMI less than 25 and discussed med and SE's. Recommended labs to assess and monitor clinical  status with further disposition pending results of labs.  I discussed the assessment and treatment plan with the patient. The patient was provided an opportunity to ask questions and all were answered. The patient agreed with the plan and demonstrated an understanding of the instructions.  I provided over 30 minutes of exam, counseling, chart review and  complex critical decision making.        The patient was advised to call back or seek an in-person evaluation if the symptoms worsen or if the condition fails to improve as anticipated.   Marinus Maw, MD

## 2022-02-07 NOTE — Patient Instructions (Signed)

## 2022-02-08 ENCOUNTER — Encounter: Payer: Self-pay | Admitting: Internal Medicine

## 2022-02-08 ENCOUNTER — Ambulatory Visit (INDEPENDENT_AMBULATORY_CARE_PROVIDER_SITE_OTHER): Payer: PPO | Admitting: Internal Medicine

## 2022-02-08 VITALS — BP 130/88 | HR 64 | Temp 97.9°F | Resp 17 | Ht 72.0 in | Wt 200.6 lb

## 2022-02-08 DIAGNOSIS — R7309 Other abnormal glucose: Secondary | ICD-10-CM | POA: Diagnosis not present

## 2022-02-08 DIAGNOSIS — E782 Mixed hyperlipidemia: Secondary | ICD-10-CM | POA: Diagnosis not present

## 2022-02-08 DIAGNOSIS — E559 Vitamin D deficiency, unspecified: Secondary | ICD-10-CM

## 2022-02-08 DIAGNOSIS — Z79899 Other long term (current) drug therapy: Secondary | ICD-10-CM | POA: Diagnosis not present

## 2022-02-08 DIAGNOSIS — I10 Essential (primary) hypertension: Secondary | ICD-10-CM

## 2022-02-09 LAB — LIPID PANEL
Cholesterol: 170 mg/dL (ref <200)
HDL: 82 mg/dL (ref >=40)
LDL Cholesterol (Calc): 64 mg/dL (calc)
Non-HDL Cholesterol (Calc): 88 mg/dL (calc) (ref <130)
Total CHOL/HDL Ratio: 2.1 (calc) (ref <5.0)
Triglycerides: 160 mg/dL — ABNORMAL HIGH (ref <150)

## 2022-02-09 LAB — COMPLETE METABOLIC PANEL WITH GFR
AG Ratio: 1.8 (calc) (ref 1.0–2.5)
ALT: 40 U/L (ref 9–46)
AST: 30 U/L (ref 10–35)
Albumin: 4.4 g/dL (ref 3.6–5.1)
Alkaline phosphatase (APISO): 57 U/L (ref 35–144)
BUN: 13 mg/dL (ref 7–25)
CO2: 25 mmol/L (ref 20–32)
Calcium: 9.2 mg/dL (ref 8.6–10.3)
Chloride: 104 mmol/L (ref 98–110)
Creat: 0.89 mg/dL (ref 0.70–1.28)
Globulin: 2.5 g/dL (calc) (ref 1.9–3.7)
Glucose, Bld: 83 mg/dL (ref 65–99)
Potassium: 4.1 mmol/L (ref 3.5–5.3)
Sodium: 140 mmol/L (ref 135–146)
Total Bilirubin: 0.5 mg/dL (ref 0.2–1.2)
Total Protein: 6.9 g/dL (ref 6.1–8.1)
eGFR: 92 mL/min/{1.73_m2} (ref >=60)

## 2022-02-09 LAB — CBC WITH DIFFERENTIAL/PLATELET
Absolute Monocytes: 550 cells/uL (ref 200–950)
Basophils Absolute: 32 cells/uL (ref 0–200)
Basophils Relative: 0.5 %
Eosinophils Absolute: 102 cells/uL (ref 15–500)
Eosinophils Relative: 1.6 %
HCT: 43.9 % (ref 38.5–50.0)
Hemoglobin: 15.3 g/dL (ref 13.2–17.1)
Lymphs Abs: 3066 cells/uL (ref 850–3900)
MCH: 32.2 pg (ref 27.0–33.0)
MCHC: 34.9 g/dL (ref 32.0–36.0)
MCV: 92.4 fL (ref 80.0–100.0)
MPV: 10 fL (ref 7.5–12.5)
Monocytes Relative: 8.6 %
Neutro Abs: 2650 cells/uL (ref 1500–7800)
Neutrophils Relative %: 41.4 %
Platelets: 269 10*3/uL (ref 140–400)
RBC: 4.75 10*6/uL (ref 4.20–5.80)
RDW: 12.8 % (ref 11.0–15.0)
Total Lymphocyte: 47.9 %
WBC: 6.4 10*3/uL (ref 3.8–10.8)

## 2022-02-09 LAB — HEMOGLOBIN A1C
Hgb A1c MFr Bld: 5 % of total Hgb (ref <5.7)
Mean Plasma Glucose: 97 mg/dL
eAG (mmol/L): 5.4 mmol/L

## 2022-02-09 LAB — MAGNESIUM: Magnesium: 2.4 mg/dL (ref 1.5–2.5)

## 2022-02-09 LAB — TSH: TSH: 2.35 mIU/L (ref 0.40–4.50)

## 2022-02-09 LAB — INSULIN, RANDOM: Insulin: 10 u[IU]/mL

## 2022-02-09 LAB — VITAMIN D 25 HYDROXY (VIT D DEFICIENCY, FRACTURES): Vit D, 25-Hydroxy: 74 ng/mL (ref 30–100)

## 2022-02-09 NOTE — Progress Notes (Signed)
<><><><><><><><><><><><><><><><><><><><><><><><><><><><><><><><><> <><><><><><><><><><><><><><><><><><><><><><><><><><><><><><><><><> -   Test results slightly outside the reference range are not unusual. If there is anything important, I will review this with you,  otherwise it is considered normal test values.  If you have further questions,  please do not hesitate to contact me at the office or via My Chart.  <><><><><><><><><><><><><><><><><><><><><><><><><><><><><><><><><> <><><><><><><><><><><><><><><><><><><><><><><><><><><><><><><><><>  -  Total Chol = 170      &   LDL Chol = 64   - Both  Excellent   - Very low risk for Heart Attack  / Stroke <><><><><><><><><><><><><><><><><><><><><><><><><><><><><><><><><> <><><><><><><><><><><><><><><><><><><><><><><><><><><><><><><><><>  -  A1c - Normal - No Diabetes  - Great   !  <><><><><><><><><><><><><><><><><><><><><><><><><><><><><><><><><> <><><><><><><><><><><><><><><><><><><><><><><><><><><><><><><><><>  -  Vitamin D = 74  - Excellent  - Please continue dose same  <><><><><><><><><><><><><><><><><><><><><><><><><><><><><><><><><> <><><><><><><><><><><><><><><><><><><><><><><><><><><><><><><><><>  -  All Else - CBC - Kidneys - Electrolytes - Liver - Magnesium & Thyroid    - all  Normal / OK <><><><><><><><><><><><><><><><><><><><><><><><><><><><><><><><><> <><><><><><><><><><><><><><><><><><><><><><><><><><><><><><><><><>  -  Keep up the [place] Work  !   <><><><><><><><><><><><><><><><><><><><><><><><><><><><><><><><><> <><><><><><><><><><><><><><><><><><><><><><><><><><><><><><><><><>

## 2022-02-10 ENCOUNTER — Encounter: Payer: Self-pay | Admitting: Internal Medicine

## 2022-02-27 ENCOUNTER — Other Ambulatory Visit: Payer: Self-pay | Admitting: Internal Medicine

## 2022-02-27 MED ORDER — FLUOROURACIL 5 % EX CREA: TOPICAL_CREAM | Freq: Two times a day (BID) | CUTANEOUS | 2 refills | Status: AC

## 2022-04-20 ENCOUNTER — Other Ambulatory Visit: Payer: Self-pay | Admitting: Internal Medicine

## 2022-05-02 ENCOUNTER — Other Ambulatory Visit: Payer: Self-pay | Admitting: Internal Medicine

## 2022-05-02 DIAGNOSIS — E349 Endocrine disorder, unspecified: Secondary | ICD-10-CM

## 2022-05-02 MED ORDER — TESTOSTERONE 20.25 MG/ACT (1.62%) TD GEL: TRANSDERMAL | 1 refills | Status: DC

## 2022-05-17 ENCOUNTER — Ambulatory Visit: Payer: PPO | Admitting: Nurse Practitioner

## 2022-05-21 ENCOUNTER — Encounter: Payer: Self-pay | Admitting: Nurse Practitioner

## 2022-05-21 ENCOUNTER — Ambulatory Visit (INDEPENDENT_AMBULATORY_CARE_PROVIDER_SITE_OTHER): Payer: PPO | Admitting: Nurse Practitioner

## 2022-05-21 VITALS — BP 130/78 | HR 67 | Temp 97.9°F | Resp 16 | Ht 72.0 in | Wt 199.6 lb

## 2022-05-21 DIAGNOSIS — F17290 Nicotine dependence, other tobacco product, uncomplicated: Secondary | ICD-10-CM

## 2022-05-21 DIAGNOSIS — E349 Endocrine disorder, unspecified: Secondary | ICD-10-CM

## 2022-05-21 DIAGNOSIS — K5790 Diverticulosis of intestine, part unspecified, without perforation or abscess without bleeding: Secondary | ICD-10-CM

## 2022-05-21 DIAGNOSIS — Z79899 Other long term (current) drug therapy: Secondary | ICD-10-CM

## 2022-05-21 DIAGNOSIS — I10 Essential (primary) hypertension: Secondary | ICD-10-CM

## 2022-05-21 DIAGNOSIS — E782 Mixed hyperlipidemia: Secondary | ICD-10-CM | POA: Diagnosis not present

## 2022-05-21 DIAGNOSIS — E559 Vitamin D deficiency, unspecified: Secondary | ICD-10-CM | POA: Diagnosis not present

## 2022-05-21 DIAGNOSIS — R7309 Other abnormal glucose: Secondary | ICD-10-CM | POA: Diagnosis not present

## 2022-05-21 NOTE — Progress Notes (Signed)
FOLLOW UP Assessment:    Essential hypertension Discussed DASH (Dietary Approaches to Stop Hypertension) DASH diet is lower in sodium than a typical American diet. Cut back on foods that are high in saturated fat, cholesterol, and trans fats. Eat more whole-grain foods, fish, poultry, and nuts Remain active and exercise as tolerated daily.  Monitor BP at home-Call if greater than 130/80.  Check CMP/CBC   Testosterone deficiency Continue replacement therapy, patient perceives benefit, check testosterone levels as needed.  Continue to monitor  Vitamin D deficiency Near goal at recent check; continue to recommend supplementation for goal of 70-100 Defer vitamin D level Continue to monitor  Other abnormal glucose Education: Reviewed 'ABCs' of diabetes management  Discussed goals to be met and/or maintained include A1C (<7) Blood pressure (<130/80) Cholesterol (LDL <70) Discussed dietary recommendations Discussed Physical Activity recommendations Check and monitor glucose/A1C   Medication management All medications discussed and reviewed in full. All questions and concerns regarding medications addressed.    Mixed hyperlipidemia Discussed lifestyle modifications. Recommended diet heavy in fruits and veggies, omega 3's. Decrease consumption of animal meats, cheeses, and dairy products. Remain active and exercise as tolerated. Continue to monitor. Check lipids  Vitamin D deficiency At goal at recent check; continue to recommend supplementation for goal of 60-100 Defer vitamin D level  Diverticulosis Bowel management discussed; high fiber diet encouraged Stay well hydrated Continue to monitor  Orders Placed This Encounter  Procedures   CBC with Differential/Platelet   COMPLETE METABOLIC PANEL WITH GFR   Lipid panel    Notify office for further evaluation and treatment, questions or concerns if any reported s/s fail to improve.   The patient was advised to call  back or seek an in-person evaluation if any symptoms worsen or if the condition fails to improve as anticipated.   Further disposition pending results of labs. Discussed med's effects and SE's.    I discussed the assessment and treatment plan with the patient. The patient was provided an opportunity to ask questions and all were answered. The patient agreed with the plan and demonstrated an understanding of the instructions.  Discussed med's effects and SE's. Screening labs and tests as requested with regular follow-up as recommended.  I provided 20 minutes of face-to-face time during this encounter including counseling, chart review, and critical decision making was preformed.    Future Appointments  Date Time Provider [place]  08/21/2022 11:00 AM Unk Pinto, MD [facility] None  11/20/2022  3:30 PM Darrol Jump, NP [facility] None    Subjective:  Brett Perez is a 71 y.o. male who presents for Medicare Annual Wellness Visit and 3 month follow up for HTN, hyperlipidemia, glucose management, and vitamin D Def.   He is a retired Chief Executive Officer.  Overall he reports feeling well.  He has no new concerns today in clinic.  He will be traveling to [place] to spend Thanksgiving with his daughter.  He will then plan for a family sailing trip in the [place].  BMI is Body mass index is 27.07 kg/m., he has been working on diet and exercise, walking on treadmill 3 days a week, also active in yard.  Wt Readings from Last 3 Encounters:  05/21/22 199 lb 9.6 oz (90.5 kg)  02/08/22 200 lb 9.6 oz (91 kg)  10/25/21 206 lb (93.4 kg)   He does have BP cuff, hasn't been checking recently due to consistent good control, today their BP is BP: 130/78 He does workout. He denies chest pain, shortness  of breath, dizziness.   He is on cholesterol medication (crestor 10 mg daily) and denies myalgias. His cholesterol is at goal. The cholesterol last visit was:   Lab Results  Component Value Date    CHOL 170 02/08/2022   HDL 82 02/08/2022   LDLCALC 64 02/08/2022   TRIG 160 (H) 02/08/2022   CHOLHDL 2.1 02/08/2022   He has been working on diet and exercise for glucose management, and denies foot ulcerations, increased appetite, nausea, paresthesia of the feet, polydipsia, polyuria, visual disturbances, vomiting and weight loss. Last A1C in the office was:  Lab Results  Component Value Date   HGBA1C 5.0 02/08/2022   Last GFR Lab Results  Component Value Date   EGFR 92 02/08/2022    Patient is on Vitamin D supplement and at goal:   Lab Results  Component Value Date   VD25OH 74 02/08/2022     He has a history of testosterone deficiency and is on testosterone replacement (gel). Also on zinc supplement. He states that the testosterone helps with his energy, libido, muscle mass. Lab Results  Component Value Date   TESTOSTERONE 309 07/11/2021     Medication Review:  Current Outpatient Medications (Endocrine & Metabolic):    Testosterone 20.25 MG/ACT (1.62%) GEL, Apply 1 pump to each Arm Daily  Current Outpatient Medications (Cardiovascular):    atenolol (TENORMIN) 100 MG tablet, Take  1 tablet  Daily  for BP                                                          /                           TAKE                       BY MOUTH (Patient taking differently: Take 1/2 tablet daily)   rosuvastatin (CRESTOR) 10 MG tablet, TAKE 1 TABLET BY MOUTH EVERY DAY   Current Outpatient Medications (Analgesics):    aspirin 81 MG tablet, Take 81 mg by mouth daily.    Current Outpatient Medications (Other):    Cholecalciferol (VITAMIN D PO), Take by mouth. Takes 4000 units daily   fluorouracil (EFUDEX) 5 % cream, Apply topically 2 (two) times daily. Apply topically 2 x / Daily   GLUCOSAMINE-CHONDROITIN PO, Take by mouth 2 (two) times daily.    Multiple Vitamin (MULTIVITAMIN) tablet, Take 1 tablet by mouth daily.   Omega-3 Fatty Acids (FISH OIL) 1000 MG CAPS, Take by mouth daily.   vitamin C  (ASCORBIC ACID) 500 MG tablet, Take 500 mg by mouth daily.   zinc gluconate 50 MG tablet, 50 mg. Takes 1 tablet weekly.  Allergies: No Known Allergies  Current Problems (verified) has Hyperlipidemia, mixed; Essential hypertension; Vitamin D deficiency; Testosterone deficiency; Abnormal glucose; Medication management; Cigar smoker; Family history of ischemic heart disease; Chronic pain of left knee; Tricompartment osteoarthritis of left knee; and Diverticulosis on their problem list.  Screening Tests Immunization History  Administered Date(s) Administered   Fluad Quad(high Dose 65+) 03/28/2020, 04/01/2022   Influenza, High Dose Seasonal PF 03/12/2017, 03/27/2018, 03/17/2021   Influenza, Seasonal, Injecte, Preservative Fre 06/21/2015   Influenza-Unspecified 07/01/2014, 04/21/2019   PFIZER Comirnaty(Gray Top)Covid-19 Tri-Sucrose Vaccine 10/07/2020   PFIZER(Purple  Top)SARS-COV-2 Vaccination 07/28/2019, 08/18/2019, 03/28/2020   PPD Test 08/13/2014, 08/30/2015   Pfizer Covid-19 Vaccine Bivalent Booster 45yr & up 03/17/2021   Pneumococcal Conjugate-13 08/13/2014   Pneumococcal Polysaccharide-23 06/14/2016   Pneumococcal-Unspecified 07/09/1997   Td 07/09/2004   Tdap 08/13/2014   Unspecified SARS-COV-2 Vaccination 04/01/2022   Zoster, Live 06/29/2014   Surgical: He  has a past surgical history that includes Appendectomy (1960); Ankle surgery (Left, 1983); Tonsillectomy (1958); Knee arthroscopy (Left, 1986, 1987); Patellar tendon repair (Right, 11/2016); Mouth surgery (2012); Colonoscopy; and Polypectomy. Family His family history includes Colon cancer (age of onset: 730 in his maternal uncle; Colon cancer (age of onset: 720 in his mother; Colon polyps in his mother. Social history  He reports that he has been smoking cigars. He has never used smokeless tobacco. He reports current alcohol use of about 10.0 standard drinks of alcohol per week. He reports that he does not use  drugs.  Objective:   Today's Vitals   05/21/22 1533  BP: 130/78  Pulse: 67  Resp: 16  Temp: 97.9 F (36.6 C)  SpO2: 98%  Weight: 199 lb 9.6 oz (90.5 kg)  Height: 6' (1.829 m)    Body mass index is 27.07 kg/m.  General appearance: alert, no distress, WD/WN, male HEENT: normocephalic, sclerae anicteric, TMs pearly, nares patent, no discharge or erythema, pharynx normal Oral cavity: MMM, no lesions Neck: supple, no lymphadenopathy, no thyromegaly, no masses Heart: RRR, normal S1, S2, no murmurs Lungs: CTA bilaterally, no wheezes, rhonchi, or rales Abdomen: +bs, soft, non tender, non distended, no masses, no hepatomegaly, no splenomegaly Musculoskeletal: no joint deformity, effusion, gait is non-antalgic  Extremities: no edema, no cyanosis, no clubbing Pulses: 2+ symmetric, upper and lower extremities, normal cap refill Neurological: alert, oriented x 3, CN2-12 intact, strength normal upper extremities and lower extremities, sensation normal throughout, DTRs 2+ throughout, no cerebellar signs, gait normal Psychiatric: normal affect, behavior normal, pleasant    Marlicia Sroka, NP   05/21/2022

## 2022-05-21 NOTE — Patient Instructions (Signed)

## 2022-05-22 LAB — COMPLETE METABOLIC PANEL WITH GFR
AG Ratio: 1.9 (calc) (ref 1.0–2.5)
ALT: 38 U/L (ref 9–46)
AST: 31 U/L (ref 10–35)
Albumin: 4.4 g/dL (ref 3.6–5.1)
Alkaline phosphatase (APISO): 55 U/L (ref 35–144)
BUN: 18 mg/dL (ref 7–25)
CO2: 26 mmol/L (ref 20–32)
Calcium: 9.3 mg/dL (ref 8.6–10.3)
Chloride: 106 mmol/L (ref 98–110)
Creat: 0.88 mg/dL (ref 0.70–1.28)
Globulin: 2.3 g/dL (calc) (ref 1.9–3.7)
Glucose, Bld: 80 mg/dL (ref 65–99)
Potassium: 4.1 mmol/L (ref 3.5–5.3)
Sodium: 140 mmol/L (ref 135–146)
Total Bilirubin: 0.5 mg/dL (ref 0.2–1.2)
Total Protein: 6.7 g/dL (ref 6.1–8.1)
eGFR: 92 mL/min/{1.73_m2} (ref >=60)

## 2022-05-22 LAB — LIPID PANEL
Cholesterol: 170 mg/dL (ref <200)
HDL: 82 mg/dL (ref >=40)
LDL Cholesterol (Calc): 64 mg/dL (calc)
Non-HDL Cholesterol (Calc): 88 mg/dL (calc) (ref <130)
Total CHOL/HDL Ratio: 2.1 (calc) (ref <5.0)
Triglycerides: 153 mg/dL — ABNORMAL HIGH (ref <150)

## 2022-05-22 LAB — CBC WITH DIFFERENTIAL/PLATELET
Absolute Monocytes: 630 cells/uL (ref 200–950)
Basophils Absolute: 27 cells/uL (ref 0–200)
Basophils Relative: 0.4 %
Eosinophils Absolute: 147 cells/uL (ref 15–500)
Eosinophils Relative: 2.2 %
HCT: 43.1 % (ref 38.5–50.0)
Hemoglobin: 15.4 g/dL (ref 13.2–17.1)
Lymphs Abs: 3062 cells/uL (ref 850–3900)
MCH: 32.4 pg (ref 27.0–33.0)
MCHC: 35.7 g/dL (ref 32.0–36.0)
MCV: 90.5 fL (ref 80.0–100.0)
MPV: 9.5 fL (ref 7.5–12.5)
Monocytes Relative: 9.4 %
Neutro Abs: 2834 cells/uL (ref 1500–7800)
Neutrophils Relative %: 42.3 %
Platelets: 268 10*3/uL (ref 140–400)
RBC: 4.76 10*6/uL (ref 4.20–5.80)
RDW: 12.7 % (ref 11.0–15.0)
Total Lymphocyte: 45.7 %
WBC: 6.7 10*3/uL (ref 3.8–10.8)

## 2022-06-06 ENCOUNTER — Telehealth: Payer: Self-pay

## 2022-06-06 ENCOUNTER — Other Ambulatory Visit: Payer: Self-pay | Admitting: Internal Medicine

## 2022-06-06 MED ORDER — AZITHROMYCIN 250 MG PO TABS: ORAL_TABLET | ORAL | 1 refills | Status: DC

## 2022-06-06 MED ORDER — DEXAMETHASONE 4 MG PO TABS: ORAL_TABLET | ORAL | 0 refills | Status: DC

## 2022-06-06 NOTE — Telephone Encounter (Signed)
Prior Authorization for Testosterone Gel completed and submitted through CoverMyMeds.

## 2022-06-06 NOTE — Telephone Encounter (Signed)
PA approved and patient notified via MyChart ?

## 2022-07-16 ENCOUNTER — Encounter: Payer: PPO | Admitting: Internal Medicine

## 2022-08-21 ENCOUNTER — Ambulatory Visit (INDEPENDENT_AMBULATORY_CARE_PROVIDER_SITE_OTHER): Payer: PPO | Admitting: Internal Medicine

## 2022-08-21 ENCOUNTER — Encounter: Payer: Self-pay | Admitting: Internal Medicine

## 2022-08-21 VITALS — BP 138/88 | HR 56 | Temp 97.9°F | Resp 17 | Ht 72.0 in | Wt 207.2 lb

## 2022-08-21 DIAGNOSIS — Z136 Encounter for screening for cardiovascular disorders: Secondary | ICD-10-CM | POA: Diagnosis not present

## 2022-08-21 DIAGNOSIS — I700 Atherosclerosis of aorta: Secondary | ICD-10-CM | POA: Diagnosis not present

## 2022-08-21 DIAGNOSIS — Z8249 Family history of ischemic heart disease and other diseases of the circulatory system: Secondary | ICD-10-CM

## 2022-08-21 DIAGNOSIS — E782 Mixed hyperlipidemia: Secondary | ICD-10-CM | POA: Diagnosis not present

## 2022-08-21 DIAGNOSIS — Z1211 Encounter for screening for malignant neoplasm of colon: Secondary | ICD-10-CM

## 2022-08-21 DIAGNOSIS — N401 Enlarged prostate with lower urinary tract symptoms: Secondary | ICD-10-CM

## 2022-08-21 DIAGNOSIS — Z125 Encounter for screening for malignant neoplasm of prostate: Secondary | ICD-10-CM | POA: Diagnosis not present

## 2022-08-21 DIAGNOSIS — Z79899 Other long term (current) drug therapy: Secondary | ICD-10-CM | POA: Diagnosis not present

## 2022-08-21 DIAGNOSIS — Z0001 Encounter for general adult medical examination with abnormal findings: Secondary | ICD-10-CM

## 2022-08-21 DIAGNOSIS — I10 Essential (primary) hypertension: Secondary | ICD-10-CM

## 2022-08-21 DIAGNOSIS — F17200 Nicotine dependence, unspecified, uncomplicated: Secondary | ICD-10-CM

## 2022-08-21 DIAGNOSIS — R7309 Other abnormal glucose: Secondary | ICD-10-CM | POA: Diagnosis not present

## 2022-08-21 DIAGNOSIS — Z0000 Encounter for general adult medical examination without abnormal findings: Secondary | ICD-10-CM

## 2022-08-21 DIAGNOSIS — E559 Vitamin D deficiency, unspecified: Secondary | ICD-10-CM | POA: Diagnosis not present

## 2022-08-21 DIAGNOSIS — N138 Other obstructive and reflux uropathy: Secondary | ICD-10-CM

## 2022-08-21 NOTE — Patient Instructions (Signed)

## 2022-08-21 NOTE — Progress Notes (Signed)
Annual  Screening/Preventative Visit  & Comprehensive Evaluation & Examination  Future Appointments  Date Time Provider Department  08/21/2022 11:00 AM Unk Pinto, MD [facility]  11/20/2022  3:30 PM Darrol Jump, NP [facility]  08/22/2023 11:00 AM Unk Pinto, MD [facility]             This very nice 72 y.o.  MWM presents for a Screening /Preventative Visit & comprehensive evaluation and management of multiple medical co-morbidities.  Patient has been followed for HTN, HLD, Prediabetes and Vitamin D Deficiency.        HTN predates since 2006. Patient's BP has been controlled at home.  Today's BP was initially elevated & rechecked at goal  - 138/88 . Patient denies any cardiac symptoms as chest pain, palpitations, shortness of breath, dizziness or ankle swelling.       Patient's hyperlipidemia is controlled with diet and medications. Patient denies myalgias or other medication SE's. Last lipids were  at goal :  Lab Results  Component Value Date   CHOL 168 01/12/2021   HDL 75 01/12/2021   LDLCALC 69 01/12/2021   TRIG 166 (H) 01/12/2021   CHOLHDL 2.2 01/12/2021         Patient has been followed expectantly for glucose intolerance  and patient denies reactive hypoglycemic symptoms, visual blurring, diabetic polys or paresthesias. Last A1c was at goal :   Lab Results  Component Value Date   HGBA1C 5.0 01/12/2021          Finally, patient has history of Vitamin D Deficiency  and last vitamin D was at goal :   Lab Results  Component Value Date   VD25OH 71 01/12/2021     Current Outpatient Medications on File Prior to Visit  Medication Sig   aspirin 81 MG tablet Take  daily.    atenolol 50 MG tablet TAKE 1 TABLET DAILY    VITAMIN D Takes 4000 units daily   GLUCOSAMINE-CHONDROITIN  Take  2  times daily.    Multiple Vitamin  Take 1 tablet daily.   Omega-3 FISH OIL 1000 MG CAPS Take  daily.   rosuvastatin \10 MG tablet TAKE 1 TABLET  EVERY DAY   Testosterone  1.62 GEL APPLY 1 PUMP TO EACH ARM DAILY   vitamin C  500 MG tablet Take  daily.   zinc g 50 MG tablet Takes 1 tablet weekly.    No Known Allergies   Past Medical History:  Diagnosis Date   Allergy    SEASONAL   Arthritis    Hyperlipidemia    DENIES BUT TAKE MEDICATION   Hypertension    Other testicular hypofunction    Vitamin D deficiency      Health Maintenance  Topic Date Due   Zoster Vaccines- Shingrix (1 of 2) Never done   COLONOSCOPY  05/17/2024   TETANUS/TDAP  08/13/2024   Pneumonia Vaccine 47+ Years old  Completed   INFLUENZA VACCINE  Completed   COVID-19 Vaccine  Completed   Hepatitis C Screening  Completed   HPV VACCINES  Aged Out     Immunization History  Administered Date(s) Administered   Fluad Quad 03/28/2020   Influenza, High Dose  03/12/2017, 03/27/2018, 03/17/2021   Influenza, Seasona 06/21/2015   Influenza 07/01/2014, 04/21/2019   PFIZER Comirnaty Covid-19 Tri-Sucrose Vaccine 10/07/2020   PFIZER SARS-COV-2 Vaccination 07/28/2019, 08/18/2019, 03/28/2020   PPD Test 08/13/2014, 08/30/2015   Pfizer Covid-19 Vaccine Bivalent Booster \ 03/17/2021   Pneumococcal -13 08/13/2014   Pneumococcal-23 06/14/2016  Pneumococcal-23 07/09/1997   Td 07/09/2004   Tdap 08/13/2014   Zoster, Live 06/29/2014    Last Colon -  05/18/2019 - Dr Loletha Carrow - Recc 5 yr f/u due Nov 2025   Past Surgical History:  Procedure Laterality Date   ANKLE SURGERY Left 1983   for bone spur   APPENDECTOMY  1960   COLONOSCOPY     KNEE ARTHROSCOPY Left 1986, [place]  2012   For implants   PATELLAR TENDON REPAIR Right 11/2016   [facility]   POLYPECTOMY     TONSILLECTOMY  1958     Family History  Problem Relation Age of Onset   Colon cancer Mother 67   Colon polyps Mother    Colon cancer Maternal Uncle 36   Esophageal cancer Neg Hx    Rectal cancer Neg Hx    Stomach cancer Neg Hx      Social History   Tobacco Use   Smoking status: Light Smoker    Types:  Cigars   Smokeless tobacco: Never   Tobacco comments:    rarely smokes a  cigar  Vaping Use   Vaping Use: Never used  Substance Use Topics   Alcohol use: Yes    Alcohol/week: 10.0 standard drinks    Types: 10 Cans of beer per week   Drug use: No      ROS Constitutional: Denies fever, chills, weight loss/gain, headaches, insomnia,  night sweats or change in appetite. Does c/o fatigue. Eyes: Denies redness, blurred vision, diplopia, discharge, itchy or watery eyes.  ENT: Denies discharge, congestion, post nasal drip, epistaxis, sore throat, earache, hearing loss, dental pain, Tinnitus, Vertigo, Sinus pain or snoring.  Cardio: Denies chest pain, palpitations, irregular heartbeat, syncope, dyspnea, diaphoresis, orthopnea, PND, claudication or edema Respiratory: denies cough, dyspnea, DOE, pleurisy, hoarseness, laryngitis or wheezing.  Gastrointestinal: Denies dysphagia, heartburn, reflux, water brash, pain, cramps, nausea, vomiting, bloating, diarrhea, constipation, hematemesis, melena, hematochezia, jaundice or hemorrhoids Genitourinary: Denies dysuria, frequency, urgency, nocturia, hesitancy, discharge, hematuria or flank pain Musculoskeletal: Denies arthralgia, myalgia, stiffness, Jt. Swelling, pain, limp or strain/sprain. Denies Falls. Skin: Denies puritis, rash, hives, warts, acne, eczema or change in skin lesion Neuro: No weakness, tremor, incoordination, spasms, paresthesia or pain Psychiatric: Denies confusion, memory loss or sensory loss. Denies Depression. Endocrine: Denies change in weight, skin, hair change, nocturia, and paresthesia, diabetic polys, visual blurring or hyper / hypo glycemic episodes.  Heme/Lymph: No excessive bleeding, bruising or enlarged lymph nodes.   Physical Exam  BP 138/88   Pulse (!) 56   Temp 97.9 F (36.6 C)   Resp 17   Ht 6' (1.829 m)   Wt 207 lb 3.2 oz (94 kg)   SpO2 98%   BMI 28.10 kg/m   General Appearance: Well nourished and well  groomed and in no apparent distress.  Eyes: PERRLA, EOMs, conjunctiva no swelling or erythema, normal fundi and vessels. Sinuses: No frontal/maxillary tenderness ENT/Mouth: EACs patent / TMs  nl. Nares clear without erythema, swelling, mucoid exudates. Oral hygiene is good. No erythema, swelling, or exudate. Tongue normal, non-obstructing. Tonsils not swollen or erythematous. Hearing normal.  Neck: Supple, thyroid not palpable. No bruits, nodes or JVD. Respiratory: Respiratory effort normal.  BS equal and clear bilateral without rales, rhonci, wheezing or stridor. Cardio: Heart sounds are normal with regular rate and rhythm and no murmurs, rubs or gallops. Peripheral pulses are normal and equal bilaterally without edema. No aortic or femoral bruits. Chest: symmetric with normal excursions and percussion.  Abdomen: Soft, with Nl bowel sounds. Nontender, no guarding, rebound, hernias, masses, or organomegaly.  Lymphatics: Non tender without lymphadenopathy.  Musculoskeletal: Full ROM all peripheral extremities, joint stability, 5/5 strength, and normal gait. Skin: Warm and dry without rashes, lesions, cyanosis, clubbing or  ecchymosis.  Neuro: Cranial nerves intact, reflexes equal bilaterally. Normal muscle tone, no cerebellar symptoms. Sensation intact.  Pysch: Alert and oriented X 3 with normal affect, insight and judgment appropriate.   Assessment and Plan   1. Encounter for general adult medical examination with abnormal findings   2. Essential hypertension  - EKG 12-Lead - [place], RETROPERITNL ABD,  LTD - Urinalysis, Routine w reflex microscopic - Microalbumin / creatinine urine ratio - CBC with Differential/Platelet - COMPLETE METABOLIC PANEL WITH GFR - Magnesium - TSH  3. Abnormal glucose  - EKG 12-Lead - [place], RETROPERITNL ABD,  LTD - CBC with Differential/Platelet - COMPLETE METABOLIC PANEL WITH GFR  4. Hyperlipidemia, mixed  - EKG 12-Lead - [place], RETROPERITNL ABD,  LTD -  Lipid panel  5. Vitamin D deficiency   6. Testosterone deficiency  - Testosterone  7. BPH with obstruction/lower urinary tract symptoms  - PSA  8. Prostate cancer screening  - PSA  9. Screening for ischemic heart disease  - EKG 12-Lead  10. FHx: heart disease  - EKG 12-Lead - [place], RETROPERITNL ABD,  LTD  11. Smoker  - EKG 12-Lead - [place], RETROPERITNL ABD,  LTD  12. Screening for AAA (aortic abdominal aneurysm)  - [place], RETROPERITNL ABD,  LTD  13. Family history of ischemic heart disease   14. Aortic atherosclerosis (HCC)  - EKG 12-Lead - [place], RETROPERITNL ABD,  LTD  15. Medication management  - Urinalysis, Routine w reflex microscopic - Microalbumin / creatinine urine ratio - Testosterone - CBC with Differential/Platelet - COMPLETE METABOLIC PANEL WITH GFR - Magnesium - Lipid panel - TS - Hemoglobin A1c - Insulin, random - VITAMIN D 25 Hydroxy   16. Screening for colorectal cancer  - POC Hemoccult Bld/Stl           Patient was counseled in prudent diet, weight control to achieve/maintain BMI less than 25, BP monitoring, regular exercise and medications as discussed.  Discussed med effects and SE's. Routine screening labs and tests as requested with regular follow-up as recommended. Over 40 minutes of exam, counseling, chart review and high complex critical decision making was performed   Kirtland Bouchard, MD

## 2022-08-22 LAB — MICROALBUMIN / CREATININE URINE RATIO
Creatinine, Urine: 40 mg/dL (ref 20–320)
Microalb, Ur: <0.2 mg/dL

## 2022-08-22 LAB — COMPLETE METABOLIC PANEL WITH GFR
AG Ratio: 1.7 (calc) (ref 1.0–2.5)
ALT: 37 U/L (ref 9–46)
AST: 26 U/L (ref 10–35)
Albumin: 4.3 g/dL (ref 3.6–5.1)
Alkaline phosphatase (APISO): 57 U/L (ref 35–144)
BUN: 15 mg/dL (ref 7–25)
CO2: 25 mmol/L (ref 20–32)
Calcium: 9.7 mg/dL (ref 8.6–10.3)
Chloride: 106 mmol/L (ref 98–110)
Creat: 0.82 mg/dL (ref 0.70–1.28)
Globulin: 2.5 g/dL (calc) (ref 1.9–3.7)
Glucose, Bld: 75 mg/dL (ref 65–99)
Potassium: 4.2 mmol/L (ref 3.5–5.3)
Sodium: 141 mmol/L (ref 135–146)
Total Bilirubin: 0.6 mg/dL (ref 0.2–1.2)
Total Protein: 6.8 g/dL (ref 6.1–8.1)
eGFR: 94 mL/min/{1.73_m2} (ref >=60)

## 2022-08-22 LAB — URINALYSIS, ROUTINE W REFLEX MICROSCOPIC
Bilirubin Urine: NEGATIVE
Glucose, UA: NEGATIVE
Hgb urine dipstick: NEGATIVE
Ketones, ur: NEGATIVE
Leukocytes,Ua: NEGATIVE
Nitrite: NEGATIVE
Protein, ur: NEGATIVE
Specific Gravity, Urine: 1.009 (ref 1.001–1.035)
pH: 7.5 (ref 5.0–8.0)

## 2022-08-22 LAB — INSULIN, RANDOM: Insulin: 13 u[IU]/mL

## 2022-08-22 LAB — PSA: PSA: 2.12 ng/mL (ref <=4.00)

## 2022-08-22 LAB — CBC WITH DIFFERENTIAL/PLATELET
Absolute Monocytes: 663 cells/uL (ref 200–950)
Basophils Absolute: 33 cells/uL (ref 0–200)
Basophils Relative: 0.5 %
Eosinophils Absolute: 182 cells/uL (ref 15–500)
Eosinophils Relative: 2.8 %
HCT: 44.7 % (ref 38.5–50.0)
Hemoglobin: 15.5 g/dL (ref 13.2–17.1)
Lymphs Abs: 2841 cells/uL (ref 850–3900)
MCH: 31.8 pg (ref 27.0–33.0)
MCHC: 34.7 g/dL (ref 32.0–36.0)
MCV: 91.8 fL (ref 80.0–100.0)
MPV: 9.9 fL (ref 7.5–12.5)
Monocytes Relative: 10.2 %
Neutro Abs: 2782 cells/uL (ref 1500–7800)
Neutrophils Relative %: 42.8 %
Platelets: 274 10*3/uL (ref 140–400)
RBC: 4.87 10*6/uL (ref 4.20–5.80)
RDW: 12.7 % (ref 11.0–15.0)
Total Lymphocyte: 43.7 %
WBC: 6.5 10*3/uL (ref 3.8–10.8)

## 2022-08-22 LAB — HEMOGLOBIN A1C
Hgb A1c MFr Bld: 5.4 % of total Hgb (ref <5.7)
Mean Plasma Glucose: 108 mg/dL
eAG (mmol/L): 6 mmol/L

## 2022-08-22 LAB — LIPID PANEL
Cholesterol: 184 mg/dL (ref <200)
HDL: 93 mg/dL (ref >=40)
LDL Cholesterol (Calc): 72 mg/dL (calc)
Non-HDL Cholesterol (Calc): 91 mg/dL (calc) (ref <130)
Total CHOL/HDL Ratio: 2 (calc) (ref <5.0)
Triglycerides: 107 mg/dL (ref <150)

## 2022-08-22 LAB — MAGNESIUM: Magnesium: 2.3 mg/dL (ref 1.5–2.5)

## 2022-08-22 LAB — VITAMIN D 25 HYDROXY (VIT D DEFICIENCY, FRACTURES): Vit D, 25-Hydroxy: 75 ng/mL (ref 30–100)

## 2022-08-22 LAB — TSH: TSH: 2.77 mIU/L (ref 0.40–4.50)

## 2022-08-22 NOTE — Progress Notes (Signed)
<><><><><><><><><><><><><><><><><><><><><><><><><><><><><><><><><> <><><><><><><><><><><><><><><><><><><><><><><><><><><><><><><><><> -   Test results slightly outside the reference range are not unusual. If there is anything important, I will review this with you,  otherwise it is considered normal test values.  If you have further questions,  please do not hesitate to contact me at the office or via My Chart.  <><><><><><><><><><><><><><><><><><><><><><><><><><><><><><><><><> Total Chol = 194      &   LDL Chol = 72    - But  Excellent  All Else - CBC - Kidneys - Electrolytes - Liver - Magnesium & Thyroid    - all  Normal / OK <><><><><><><><><><><><><><><><><><><><><><><><><><><><><><><><><>  -  Total Chol = 170    & LDL = 72  - Both Excellent. <><><><><><><><><><><><><><><><><><><><><><><><><><><><><><><><><>  -  Vitamin D = 75   - Excellent  -PLease keep dose vit D same   ===========================================================   - A1c - Normal =- No Diabetes  - Great !  ===========================================================   - PSA - Also  Normal -                           <><><><><><><><><><><><><><><><><><><><><><><><><><><><><><><><><>  -

## 2022-08-26 ENCOUNTER — Encounter: Payer: Self-pay | Admitting: Internal Medicine

## 2022-09-04 MED ORDER — HYDROCHLOROTHIAZIDE 25 MG PO TABS: ORAL_TABLET | ORAL | 3 refills | Status: DC

## 2022-09-04 NOTE — Addendum Note (Signed)
Addended by: Unk Pinto on: 09/04/2022 12:33 PM   Modules accepted: Orders

## 2022-09-14 ENCOUNTER — Other Ambulatory Visit: Payer: Self-pay | Admitting: Internal Medicine

## 2022-09-24 ENCOUNTER — Other Ambulatory Visit: Payer: Self-pay

## 2022-09-24 DIAGNOSIS — Z1212 Encounter for screening for malignant neoplasm of rectum: Secondary | ICD-10-CM | POA: Diagnosis not present

## 2022-09-24 DIAGNOSIS — Z1211 Encounter for screening for malignant neoplasm of colon: Secondary | ICD-10-CM

## 2022-09-24 LAB — POC HEMOCCULT BLD/STL (HOME/3-CARD/SCREEN)
Card #2 Fecal Occult Blod, POC: NEGATIVE
Card #3 Fecal Occult Blood, POC: NEGATIVE
Fecal Occult Blood, POC: NEGATIVE

## 2022-10-30 ENCOUNTER — Ambulatory Visit: Payer: PPO | Admitting: Nurse Practitioner

## 2022-11-20 ENCOUNTER — Ambulatory Visit (INDEPENDENT_AMBULATORY_CARE_PROVIDER_SITE_OTHER): Payer: PPO | Admitting: Nurse Practitioner

## 2022-11-20 ENCOUNTER — Encounter: Payer: Self-pay | Admitting: Nurse Practitioner

## 2022-11-20 VITALS — BP 110/72 | HR 61 | Temp 97.5°F | Ht 72.0 in | Wt 199.2 lb

## 2022-11-20 DIAGNOSIS — F17290 Nicotine dependence, other tobacco product, uncomplicated: Secondary | ICD-10-CM

## 2022-11-20 DIAGNOSIS — E349 Endocrine disorder, unspecified: Secondary | ICD-10-CM

## 2022-11-20 DIAGNOSIS — Z0000 Encounter for general adult medical examination without abnormal findings: Secondary | ICD-10-CM

## 2022-11-20 DIAGNOSIS — Z79899 Other long term (current) drug therapy: Secondary | ICD-10-CM | POA: Diagnosis not present

## 2022-11-20 DIAGNOSIS — K5790 Diverticulosis of intestine, part unspecified, without perforation or abscess without bleeding: Secondary | ICD-10-CM | POA: Diagnosis not present

## 2022-11-20 DIAGNOSIS — R7309 Other abnormal glucose: Secondary | ICD-10-CM | POA: Diagnosis not present

## 2022-11-20 DIAGNOSIS — N138 Other obstructive and reflux uropathy: Secondary | ICD-10-CM

## 2022-11-20 DIAGNOSIS — E782 Mixed hyperlipidemia: Secondary | ICD-10-CM

## 2022-11-20 DIAGNOSIS — I10 Essential (primary) hypertension: Secondary | ICD-10-CM

## 2022-11-20 DIAGNOSIS — E559 Vitamin D deficiency, unspecified: Secondary | ICD-10-CM | POA: Diagnosis not present

## 2022-11-20 DIAGNOSIS — N401 Enlarged prostate with lower urinary tract symptoms: Secondary | ICD-10-CM | POA: Diagnosis not present

## 2022-11-20 LAB — CBC WITH DIFFERENTIAL/PLATELET
Eosinophils Relative: 2.2 %
Hemoglobin: 15.9 g/dL (ref 13.2–17.1)
Lymphs Abs: 2973 cells/uL (ref 850–3900)
MCH: 31.9 pg (ref 27.0–33.0)
MCV: 91.8 fL (ref 80.0–100.0)
MPV: 10 fL (ref 7.5–12.5)
Neutro Abs: 4172 cells/uL (ref 1500–7800)
RBC: 4.98 10*6/uL (ref 4.20–5.80)

## 2022-11-20 NOTE — Progress Notes (Signed)
MEDICARE ANNUAL WELLNESS VISIT AND FOLLOW UP Assessment:   Annual Medicare Wellness Visit Due annually  Health maintenance reviewed  Essential hypertension Controlled - continue medications Discussed DASH (Dietary Approaches to Stop Hypertension) DASH diet is lower in sodium than a typical American diet. Cut back on foods that are high in saturated fat, cholesterol, and trans fats. Eat more whole-grain foods, fish, poultry, and nuts Remain active and exercise as tolerated daily.  Monitor BP at home-Call if greater than 130/80.  Check CMP/CBC  Testosterone deficiency Continue replacement therapy, patient perceives benefit, check testosterone levels as needed.   Vitamin D deficiency Continue supplement for goal of 60-100 Monitor Vitamin D levels  Other abnormal glucose Education: Reviewed 'ABCs' of diabetes management  Discussed goals to be met and/or maintained include A1C (<7) Blood pressure (<130/80) Cholesterol (LDL <70) Continue Eye Exam yearly  Continue Dental Exam Q6 mo Discussed dietary recommendations Discussed Physical Activity recommendations Check A1C  Medication management All medications discussed and reviewed in full. All questions and concerns regarding medications addressed.    Mixed hyperlipidemia Discussed lifestyle modifications. Recommended diet heavy in fruits and veggies, omega 3's. Decrease consumption of animal meats, cheeses, and dairy products. Remain active and exercise as tolerated. Continue to monitor. Check lipids/TSH  Vitamin D deficiency Continue supplement for goal of 60-100 Monitor Vitamin D levels  Diverticulosis Controlled Bowel management discussed Continue to monitor  Smoker Rare cigar smoker Discussed risks associated with tobacco use and advised to reduce or quit Smoking cessation instruction/counseling given:  counseled patient on the dangers of tobacco use, advised patient to stop smoking, and reviewed strategies to  maximize success  Chronic pain left knee/tricompartment arthritis  Mild sx, continue chondroitin/glucosamine, PRN NSAID Ortho follows PRN  Ganglion cyst Left 4th MCP joint (anterior) Stable - if increase in pain, redness, warmth, swelling, contact office for further evaluation.  Continue to monitor   Orders Placed This Encounter  Procedures   CBC with Differential/Platelet   COMPLETE METABOLIC PANEL WITH GFR   Lipid panel   Hemoglobin A1c   VITAMIN D 25 Hydroxy (Vit-D Deficiency, Fractures)    Notify office for further evaluation and treatment, questions or concerns if any reported s/s fail to improve.   The patient was advised to call back or seek an in-person evaluation if any symptoms worsen or if the condition fails to improve as anticipated.   Further disposition pending results of labs. Discussed med's effects and SE's.    I discussed the assessment and treatment plan with the patient. The patient was provided an opportunity to ask questions and all were answered. The patient agreed with the plan and demonstrated an understanding of the instructions.  Discussed med's effects and SE's. Screening labs and tests as requested with regular follow-up as recommended.  I provided 35 minutes of face-to-face time during this encounter including counseling, chart review, and critical decision making was preformed.  Today's Plan of Care is based on a patient-centered health care approach known as shared decision making - the decisions, tests and treatments allow for patient preferences and values to be balanced with clinical evidence.    Future Appointments  Date Time Provider Department Center  02/25/2023 11:30 AM Lucky Cowboy, MD [facility] None  08/22/2023 11:00 AM Lucky Cowboy, MD [facility] None  11/20/2023  2:00 PM Adela Glimpse, NP [facility] None     Plan:   During the course of the visit the patient was educated and counseled about appropriate screening and  preventive services including:  Pneumococcal vaccine  Influenza vaccine Prevnar 13 Td vaccine Screening electrocardiogram Colorectal cancer screening Diabetes screening Glaucoma screening Nutrition counseling    Subjective:  Brett Perez is a 72 y.o. male who presents for Medicare Annual Wellness Visit and 3 month follow up for HTN, hyperlipidemia, glucose management, and vitamin D Def.   Overall he reports feeling well today.    He has noticed a nodule at the base of his ring finger, palm side, left hand that has slowly grown in size.  Area is not painful, not erythematous, no pustule.     He is a occasional cigar smoker.    He has left knee pain, hx arthroscopies remotely by Dr. Applington/Murphy, likely needs replacement at some point, has seen Dr. Dion Saucier and had xrays in 07/2021, taking glucosamine/chondroitin, rare ibuprofen, monitoring for now.  Currently stable.   BMI is Body mass index is 27.02 kg/m., he has been working on diet and exercise, walking on treadmill 3 days a week, also active in yard.  Wt Readings from Last 3 Encounters:  11/20/22 199 lb 3.2 oz (90.4 kg)  08/21/22 207 lb 3.2 oz (94 kg)  05/21/22 199 lb 9.6 oz (90.5 kg)   He does have BP cuff, hasn't been checking recently due to consistent good control, today their BP is BP: 110/72 He does workout. He denies chest pain, shortness of breath, dizziness.   He is on cholesterol medication (crestor 10 mg daily) and denies myalgias. His cholesterol is at goal. The cholesterol last visit was:   Lab Results  Component Value Date   CHOL 184 08/21/2022   HDL 93 08/21/2022   LDLCALC 72 08/21/2022   TRIG 107 08/21/2022   CHOLHDL 2.0 08/21/2022   He has been working on diet and exercise for glucose management, and denies foot ulcerations, increased appetite, nausea, paresthesia of the feet, polydipsia, polyuria, visual disturbances, vomiting and weight loss. Last A1C in the office was:  Lab Results  Component  Value Date   HGBA1C 5.4 08/21/2022   Last GFR Lab Results  Component Value Date   EGFR 94 08/21/2022    Patient is on Vitamin D supplement and at goal:   Lab Results  Component Value Date   VD25OH 75 08/21/2022     He has a history of testosterone deficiency and is on testosterone replacement (gel). Also on zinc supplement. He states that the testosterone helps with his energy, libido, muscle mass. Lab Results  Component Value Date   TESTOSTERONE 309 07/11/2021     Medication Review:  Current Outpatient Medications (Endocrine & Metabolic):    Testosterone 20.25 MG/ACT (1.62%) GEL, Apply 1 pump to each Arm Daily   dexamethasone (DECADRON) 4 MG tablet, Take 1 tab 3 x day - 3 days, then 2 x day - 3 days, then 1 tab daily  Current Outpatient Medications (Cardiovascular):    atenolol (TENORMIN) 100 MG tablet, TAKE ONE TABLET BY MOUTH DAILY FOR BLOOD PRESSURE (Patient taking differently: 50 mg. TAKE ONE TABLET BY MOUTH DAILY FOR BLOOD PRESSURE)   hydrochlorothiazide (HYDRODIURIL) 25 MG tablet, Take  1 tablet  Daily  for BP and fluid   rosuvastatin (CRESTOR) 10 MG tablet, TAKE 1 TABLET BY MOUTH EVERY DAY   Current Outpatient Medications (Analgesics):    aspirin 81 MG tablet, Take 81 mg by mouth daily.    Current Outpatient Medications (Other):    Cholecalciferol (VITAMIN D PO), Take by mouth. Takes 4000 units daily   fluorouracil (EFUDEX) 5 % cream,  Apply topically 2 (two) times daily. Apply topically 2 x / Daily   GLUCOSAMINE-CHONDROITIN PO, Take by mouth 2 (two) times daily.    Multiple Vitamin (MULTIVITAMIN) tablet, Take 1 tablet by mouth daily.   Omega-3 Fatty Acids (FISH OIL) 1000 MG CAPS, Take by mouth daily.   vitamin C (ASCORBIC ACID) 500 MG tablet, Take 500 mg by mouth daily.   zinc gluconate 50 MG tablet, 50 mg. Takes 1 tablet weekly.   azithromycin (ZITHROMAX) 250 MG tablet, Take 2 tablets with Food on  Day 1, then 1 tablet Daily with Food for Sinusitis /  Bronchitis  Allergies: No Known Allergies  Current Problems (verified) has Hyperlipidemia, mixed; Essential hypertension; Vitamin D deficiency; Testosterone deficiency; Abnormal glucose; Medication management; Cigar smoker; Family history of ischemic heart disease; Chronic pain of left knee; Tricompartment osteoarthritis of left knee; and Diverticulosis on their problem list.  Screening Tests Immunization History  Administered Date(s) Administered   Fluad Quad(high Dose 65+) 03/28/2020, 04/01/2022   Influenza, High Dose Seasonal PF 03/12/2017, 03/27/2018, 03/17/2021   Influenza, Seasonal, Injecte, Preservative Fre 06/21/2015   Influenza-Unspecified 07/01/2014, 04/21/2019   PFIZER Comirnaty(Gray Top)Covid-19 Tri-Sucrose Vaccine 10/07/2020   PFIZER(Purple Top)SARS-COV-2 Vaccination 07/28/2019, 08/18/2019, 03/28/2020   PPD Test 08/13/2014, 08/30/2015   Pfizer Covid-19 Vaccine Bivalent Booster 62yrs & up 03/17/2021   Pneumococcal Conjugate-13 08/13/2014   Pneumococcal Polysaccharide-23 06/14/2016   Pneumococcal-Unspecified 07/09/1997   Td 07/09/2004   Tdap 08/13/2014   Unspecified SARS-COV-2 Vaccination 04/01/2022   Zoster, Live 06/29/2014   Preventative care: Last colonoscopy: 05/2019, due 2025 due to family hx, [place] Dr. Myrtie Neither, diverticula  [place] AB 2004  Prior vaccinations:  Shingles/Zostavax: 2015 Covid 19: 4/4, 2021, pfizer  Names of Other Physician/Practitioners you currently use: 1. [facility] here for primary care 2. Dr. Nile Riggs, eye doctor, last visit 2023, has upcoming scheduled, glassses 3. [facility], dentist, last visit 2023 q6 months  Patient Care Team: Lucky Cowboy, MD as PCP - General (Internal Medicine) Teryl Lucy, MD as Consulting Physician (Orthopedic Surgery) [facility], [facility]  Surgical: He  has a past surgical history that includes Appendectomy (1960); Ankle surgery (Left, 1983); Tonsillectomy  (1958); Knee arthroscopy (Left, 1986, 1987); Patellar tendon repair (Right, 11/2016); Mouth surgery (2012); Colonoscopy; and Polypectomy. Family His family history includes Colon cancer (age of onset: 59) in his maternal uncle; Colon cancer (age of onset: 53) in his mother; Colon polyps in his mother. Social history  He reports that he has been smoking cigars. He has never used smokeless tobacco. He reports current alcohol use of about 10.0 standard drinks of alcohol per week. He reports that he does not use drugs.  MEDICARE WELLNESS OBJECTIVES: Physical activity:   Cardiac risk factors:   Depression/mood screen:      11/20/2022    4:13 PM  Depression screen PHQ 2/9  Decreased Interest 0  Down, Depressed, Hopeless 0  PHQ - 2 Score 0    ADLs:     11/20/2022    4:10 PM 02/10/2022   10:48 AM  In your present state of health, do you have any difficulty performing the following activities:  Hearing? 0 0  Vision? 0 0  Difficulty concentrating or making decisions? 0 0  Walking or climbing stairs? 0 0  Dressing or bathing? 0 0  Doing errands, shopping? 0 0     Cognitive Testing  Alert? Yes  Normal Appearance?Yes  Oriented to person? Yes  Place? Yes   Time? Yes  Recall of three objects?  Yes  Can perform simple calculations? Yes  Displays appropriate judgment?Yes  Can read the correct time from a watch face?Yes  EOL planning:     Objective:   Today's Vitals   11/20/22 1558  BP: 110/72  Pulse: 61  Temp: (!) 97.5 F (36.4 C)  SpO2: 99%  Weight: 199 lb 3.2 oz (90.4 kg)  Height: 6' (1.829 m)   Body mass index is 27.02 kg/m.  General appearance: alert, no distress, WD/WN, male HEENT: normocephalic, sclerae anicteric, TMs pearly, nares patent, no discharge or erythema, pharynx normal Oral cavity: MMM, no lesions Neck: supple, no lymphadenopathy, no thyromegaly, no masses Heart: RRR, normal S1, S2, no murmurs Lungs: CTA bilaterally, no wheezes, rhonchi, or rales Abdomen:  +bs, soft, non tender, non distended, no masses, no hepatomegaly, no splenomegaly Musculoskeletal: Left anterior MCP joint with approximately 1 cm round raised moveable nodule.  Non-tender.  No joint deformity, effusion, gait is non-antalgic  Extremities: no edema, no cyanosis, no clubbing Pulses: 2+ symmetric, upper and lower extremities, normal cap refill Neurological: alert, oriented x 3, CN2-12 intact, strength normal upper extremities and lower extremities, sensation normal throughout, DTRs 2+ throughout, no cerebellar signs, gait normal Psychiatric: normal affect, behavior normal, pleasant   Medicare Attestation I have personally reviewed: The patient's medical and social history Their use of alcohol, tobacco or illicit drugs Their current medications and supplements The patient's functional ability including ADLs,fall risks, home safety risks, cognitive, and hearing and visual impairment Diet and physical activities Evidence for depression or mood disorders  The patient's weight, height, BMI, and visual acuity have been recorded in the chart.  I have made referrals, counseling, and provided education to the patient based on review of the above and I have provided the patient with a written personalized care plan for preventive services.     Adela Glimpse, NP   11/20/2022

## 2022-11-20 NOTE — Patient Instructions (Signed)

## 2022-11-21 LAB — CBC WITH DIFFERENTIAL/PLATELET
Absolute Monocytes: 737 cells/uL (ref 200–950)
Basophils Absolute: 41 cells/uL (ref 0–200)
Basophils Relative: 0.5 %
Eosinophils Absolute: 178 cells/uL (ref 15–500)
HCT: 45.7 % (ref 38.5–50.0)
MCHC: 34.8 g/dL (ref 32.0–36.0)
Monocytes Relative: 9.1 %
Neutrophils Relative %: 51.5 %
Platelets: 293 10*3/uL (ref 140–400)
RDW: 12.6 % (ref 11.0–15.0)
Total Lymphocyte: 36.7 %
WBC: 8.1 10*3/uL (ref 3.8–10.8)

## 2022-11-21 LAB — COMPLETE METABOLIC PANEL WITH GFR
AG Ratio: 1.6 (calc) (ref 1.0–2.5)
ALT: 43 U/L (ref 9–46)
AST: 35 U/L (ref 10–35)
Albumin: 4.4 g/dL (ref 3.6–5.1)
Alkaline phosphatase (APISO): 61 U/L (ref 35–144)
BUN: 15 mg/dL (ref 7–25)
CO2: 27 mmol/L (ref 20–32)
Calcium: 9.4 mg/dL (ref 8.6–10.3)
Chloride: 102 mmol/L (ref 98–110)
Creat: 0.91 mg/dL (ref 0.70–1.28)
Globulin: 2.7 g/dL (calc) (ref 1.9–3.7)
Glucose, Bld: 86 mg/dL (ref 65–99)
Potassium: 4.2 mmol/L (ref 3.5–5.3)
Sodium: 139 mmol/L (ref 135–146)
Total Bilirubin: 0.5 mg/dL (ref 0.2–1.2)
Total Protein: 7.1 g/dL (ref 6.1–8.1)
eGFR: 90 mL/min/{1.73_m2} (ref >=60)

## 2022-11-21 LAB — VITAMIN D 25 HYDROXY (VIT D DEFICIENCY, FRACTURES): Vit D, 25-Hydroxy: 72 ng/mL (ref 30–100)

## 2022-11-21 LAB — LIPID PANEL
Cholesterol: 174 mg/dL (ref <200)
HDL: 88 mg/dL (ref >=40)
LDL Cholesterol (Calc): 68 mg/dL (calc)
Non-HDL Cholesterol (Calc): 86 mg/dL (calc) (ref <130)
Total CHOL/HDL Ratio: 2 (calc) (ref <5.0)
Triglycerides: 92 mg/dL (ref <150)

## 2022-11-21 LAB — HEMOGLOBIN A1C
Hgb A1c MFr Bld: 5.4 % of total Hgb (ref <5.7)
Mean Plasma Glucose: 108 mg/dL
eAG (mmol/L): 6 mmol/L

## 2023-01-17 ENCOUNTER — Telehealth: Payer: Self-pay | Admitting: Nurse Practitioner

## 2023-01-17 DIAGNOSIS — E782 Mixed hyperlipidemia: Secondary | ICD-10-CM

## 2023-01-17 MED ORDER — ROSUVASTATIN CALCIUM 10 MG PO TABS
ORAL_TABLET | ORAL | 3 refills | Status: AC
Start: 2023-01-17 — End: ?

## 2023-01-17 NOTE — Addendum Note (Signed)
Addended by: Dionicio Stall on: 01/17/2023 04:28 PM   Modules accepted: Orders

## 2023-01-17 NOTE — Telephone Encounter (Signed)
Refill on Crestor for [facility] on file pls

## 2023-02-24 ENCOUNTER — Encounter: Payer: Self-pay | Admitting: Internal Medicine

## 2023-02-24 NOTE — Progress Notes (Unsigned)
Future Appointments  Date Time Provider Department  02/25/2023 11:30 AM Lucky Cowboy, MD [facility]  08/22/2023 11:00 AM Lucky Cowboy, MD [facility]  11/20/2023  2:00 PM Adela Glimpse, NP [facility]    History of Present Illness:      This very nice 72 y.o. MWM presents for 6 month follow up with HTN, HLD, Pre-Diabetes and Vitamin D Deficiency.         Patient is treated for HTN (2006) & BP has been controlled at home. Today's BP is  at goal -                                  .    Patient has had no complaints of any cardiac type chest pain, palpitations, dyspnea Pollyann Kennedy /PND, dizziness, claudication, or dependent edema.        Hyperlipidemia is controlled with diet  & Crestor.  Patient denies myalgias or other med SE's. Last Lipids were at goal :  Lab Results  Component Value Date   CHOL 174 11/20/2022   HDL 88 11/20/2022   LDLCALC 68 11/20/2022   TRIG 92 11/20/2022   CHOLHDL 2.0 11/20/2022     Also, the patient is monitored expectantly for glucose intolerance and has had no symptoms of reactive hypoglycemia, diabetic polys, paresthesias or visual blurring.  Last A1c was normal & at goal :  Lab Results  Component Value Date   HGBA1C 5.4 11/20/2022                                             Patient is on Testosterone replacement with Androgel for  Deficiency ("228.87 /2013)  and endorses improved stamina & sense of well being.                                                          Further, the patient also has history of Vitamin D Deficiency and supplements vitamin D without any suspected side-effects. Last vitamin D was at goal :  Lab Results  Component Value Date   VD25OH 72 11/20/2022       Current Outpatient Medications  Medication Instructions   VITAMIN C 500 mg, Daily   aspirin 81 mg, Oral, Daily   atenolol 100 MG tablet TAKE ONE TABLET DAILY    VITAMIN D  Takes 4000 units daily    fluorouracil (EFUDEX) 5 % cream Topical, 2 times  daily, Apply topically 2 x / Daily   GLUCOSAMINE-CHONDROITIN Oral, 2 times daily   hydrochlorothiazide  25 MG tablet Take  1 tablet  Daily    Multiple Vitamin (MULTIVITAMIN) tablet 1 tablet  Daily   Omega-3 FISH OIL 1000 MG  Daily   rosuvastatin 10 MG tablet TAKE 1 TABLET EVERY DAY   Testosterone 20.25 MG/ACT (1.62%) GEL Apply 1 pump to each Arm Daily   zinc gluconate 50 mg, Takes 1 tablet weekly.     No Known Allergies    PMHx:   Past Medical History:  Diagnosis Date   Allergy    SEASONAL   Arthritis    Hyperlipidemia    DENIES BUT TAKE MEDICATION  Hypertension    Other testicular hypofunction    Vitamin D deficiency     Immunization History  Administered Date(s) Administered   Fluad Quad(high Dose 65+) 03/28/2020   Influenza, High Dose Seasonal PF 03/12/2017, 03/27/2018   Influenza, Seasonal, Injecte, Preservative Fre 06/21/2015   Influenza-Unspecified 07/01/2014, 04/21/2019   PFIZER Comirnaty Covid-19 Tri-Sucrose Vaccine 10/07/2020   PFIZER(  SARS-COV-2 Vaccination 07/28/2019, 08/18/2019, 03/28/2020   PPD Test 08/13/2014, 08/30/2015   Pneumococcal -13 08/13/2014   Pneumococcal -23 06/14/2016   Pneumococcal-23 07/09/1997   Td 07/09/2004   Tdap 08/13/2014   Zoster, Live 06/29/2014     Past Surgical History:  Procedure Laterality Date   ANKLE SURGERY Left 1983   for bone spur   APPENDECTOMY  1960   COLONOSCOPY     KNEE ARTHROSCOPY Left 1986, 1987   MOUTH SURGERY  2012   For implants   PATELLAR TENDON REPAIR Right 11/2016   [facility]   POLYPECTOMY     TONSILLECTOMY  1958    FHx:    Reviewed / unchanged  SHx:    Reviewed / unchanged    Systems Review:  Constitutional: Denies fever, chills, wt changes, headaches, insomnia, fatigue, night sweats, change in appetite. Eyes: Denies redness, blurred vision, diplopia, discharge, itchy, watery eyes.  ENT: Denies discharge, congestion, post nasal drip, epistaxis, sore throat, earache, hearing loss, dental pain,  tinnitus, vertigo, sinus pain, snoring.  CV: Denies chest pain, palpitations, irregular heartbeat, syncope, dyspnea, diaphoresis, orthopnea, PND, claudication or edema. Respiratory: denies cough, dyspnea, DOE, pleurisy, hoarseness, laryngitis, wheezing.  Gastrointestinal: Denies dysphagia, odynophagia, heartburn, reflux, water brash, abdominal pain or cramps, nausea, vomiting, bloating, diarrhea, constipation, hematemesis, melena, hematochezia  or hemorrhoids. Genitourinary: Denies dysuria, frequency, urgency, nocturia, hesitancy, discharge, hematuria or flank pain. Musculoskeletal: Denies arthralgias, myalgias, stiffness, jt. swelling, pain, limping or strain/sprain.  Skin: Denies pruritus, rash, hives, warts, acne, eczema or change in skin lesion(s). Neuro: No weakness, tremor, incoordination, spasms, paresthesia or pain. Psychiatric: Denies confusion, memory loss or sensory loss. Endo: Denies change in weight, skin or hair change.  Heme/Lymph: No excessive bleeding, bruising or enlarged lymph nodes.  Physical Exam  There were no vitals taken for this visit.  Appears  well nourished, well groomed  and in no distress.  Eyes: PERRLA, EOMs, conjunctiva no swelling or erythema. Sinuses: No frontal/maxillary tenderness ENT/Mouth: EAC's clear, TM's nl w/o erythema, bulging. Nares clear w/o erythema, swelling, exudates. Oropharynx clear without erythema or exudates. Oral hygiene is good. Tongue normal, non obstructing. Hearing intact.  Neck: Supple. Thyroid not palpable. Car 2+/2+ without bruits, nodes or JVD. Chest: Respirations nl with BS clear & equal w/o rales, rhonchi, wheezing or stridor.  Cor: Heart sounds normal w/ regular rate and rhythm without sig. murmurs, gallops, clicks or rubs. Peripheral pulses normal and equal  without edema.  Abdomen: Soft & bowel sounds normal. Non-tender w/o guarding, rebound, hernias, masses or organomegaly.  Lymphatics: Unremarkable.  Musculoskeletal: Full  ROM all peripheral extremities, joint stability, 5/5 strength and normal gait.  Skin: Warm, dry without exposed rashes, lesions or ecchymosis apparent.  Neuro: Cranial nerves intact, reflexes equal bilaterally. Sensory-motor testing grossly intact. Tendon reflexes grossly intact.  Pysch: Alert & oriented x 3.  Insight and judgement nl & appropriate. No ideations.  Assessment and Plan:   1. Essential hypertension  - Continue medication, monitor blood pressure at home.  - Continue DASH diet.  Reminder to go to the ER if any CP,  SOB, nausea, dizziness, severe HA, changes vision/speech.   -  CBC with Differential/Platelet - COMPLETE METABOLIC PANEL WITH GFR - Magnesium - TSH  2.   Hyperlipidemia, mixed  - Continue diet/meds, exercise,& lifestyle modifications.  - Continue monitor periodic cholesterol/liver & renal functions   - Lipid panel - TSH  3. Abnormal glucose  - Continue diet, exercise  - Lifestyle modifications.  - Monitor appropriate labs   - Hemoglobin A1c - Insulin, random  4. Vitamin D deficiency  - Continue supplementation   - VITAMIN D 25 Hydroxy   5. Medication management  - CBC with Differential/Platelet - COMPLETE METABOLIC PANEL WITH GFR - Magnesium - Lipid panel - TSH - Hemoglobin A1c - Insulin, random - VITAMIN D 25 Hydroxy         Discussed  regular exercise, BP monitoring, weight control to achieve/maintain BMI less than 25 and discussed med and SE's. Recommended labs to assess and monitor clinical status with further disposition pending results of labs.  I discussed the assessment and treatment plan with the patient. The patient was provided an opportunity to ask questions and all were answered. The patient agreed with the plan and demonstrated an understanding of the instructions.  I provided over 30 minutes of exam, counseling, chart review and  complex critical decision making.        The patient was advised to call back or seek an  in-person evaluation if the symptoms worsen or if the condition fails to improve as anticipated.   Marinus Maw, MD

## 2023-02-24 NOTE — Patient Instructions (Signed)

## 2023-02-25 ENCOUNTER — Ambulatory Visit (INDEPENDENT_AMBULATORY_CARE_PROVIDER_SITE_OTHER): Payer: PPO | Admitting: Internal Medicine

## 2023-02-25 ENCOUNTER — Encounter: Payer: Self-pay | Admitting: Internal Medicine

## 2023-02-25 VITALS — BP 120/70 | HR 61 | Temp 97.9°F | Resp 16 | Ht 72.0 in | Wt 197.0 lb

## 2023-02-25 DIAGNOSIS — R7309 Other abnormal glucose: Secondary | ICD-10-CM

## 2023-02-25 DIAGNOSIS — Z79899 Other long term (current) drug therapy: Secondary | ICD-10-CM

## 2023-02-25 DIAGNOSIS — I10 Essential (primary) hypertension: Secondary | ICD-10-CM

## 2023-02-25 DIAGNOSIS — E559 Vitamin D deficiency, unspecified: Secondary | ICD-10-CM

## 2023-02-25 DIAGNOSIS — E782 Mixed hyperlipidemia: Secondary | ICD-10-CM

## 2023-02-26 LAB — COMPLETE METABOLIC PANEL WITH GFR
AG Ratio: 2 (calc) (ref 1.0–2.5)
ALT: 30 U/L (ref 9–46)
AST: 30 U/L (ref 10–35)
Albumin: 4.1 g/dL (ref 3.6–5.1)
Alkaline phosphatase (APISO): 57 U/L (ref 35–144)
BUN: 14 mg/dL (ref 7–25)
CO2: 25 mmol/L (ref 20–32)
Calcium: 8.8 mg/dL (ref 8.6–10.3)
Chloride: 106 mmol/L (ref 98–110)
Creat: 0.77 mg/dL (ref 0.70–1.28)
Globulin: 2.1 g/dL (ref 1.9–3.7)
Glucose, Bld: 103 mg/dL — ABNORMAL HIGH (ref 65–99)
Potassium: 3.9 mmol/L (ref 3.5–5.3)
Sodium: 138 mmol/L (ref 135–146)
Total Bilirubin: 0.7 mg/dL (ref 0.2–1.2)
Total Protein: 6.2 g/dL (ref 6.1–8.1)
eGFR: 96 mL/min/{1.73_m2} (ref >=60)

## 2023-02-26 LAB — CBC WITH DIFFERENTIAL/PLATELET
Absolute Monocytes: 670 cells/uL (ref 200–950)
Basophils Absolute: 19 {cells}/uL (ref 0–200)
Basophils Relative: 0.3 %
Eosinophils Absolute: 211 {cells}/uL (ref 15–500)
Eosinophils Relative: 3.4 %
HCT: 42.1 % (ref 38.5–50.0)
Hemoglobin: 14.5 g/dL (ref 13.2–17.1)
Lymphs Abs: 2833 {cells}/uL (ref 850–3900)
MCH: 31.9 pg (ref 27.0–33.0)
MCHC: 34.4 g/dL (ref 32.0–36.0)
MCV: 92.5 fL (ref 80.0–100.0)
MPV: 10 fL (ref 7.5–12.5)
Monocytes Relative: 10.8 %
Neutro Abs: 2468 {cells}/uL (ref 1500–7800)
Neutrophils Relative %: 39.8 %
Platelets: 281 10*3/uL (ref 140–400)
RBC: 4.55 10*6/uL (ref 4.20–5.80)
RDW: 12.9 % (ref 11.0–15.0)
Total Lymphocyte: 45.7 %
WBC: 6.2 10*3/uL (ref 3.8–10.8)

## 2023-02-26 LAB — MAGNESIUM: Magnesium: 2.2 mg/dL (ref 1.5–2.5)

## 2023-02-26 LAB — LIPID PANEL
Cholesterol: 163 mg/dL (ref <200)
HDL: 75 mg/dL (ref >=40)
LDL Cholesterol (Calc): 66 mg/dL
Non-HDL Cholesterol (Calc): 88 mg/dL (ref <130)
Total CHOL/HDL Ratio: 2.2 (calc) (ref <5.0)
Triglycerides: 134 mg/dL (ref <150)

## 2023-02-26 LAB — INSULIN, RANDOM: Insulin: 20.9 u[IU]/mL — ABNORMAL HIGH

## 2023-02-26 LAB — HEMOGLOBIN A1C
Hgb A1c MFr Bld: 5.3 %{Hb} (ref <5.7)
Mean Plasma Glucose: 105 mg/dL
eAG (mmol/L): 5.8 mmol/L

## 2023-02-26 LAB — VITAMIN D 25 HYDROXY (VIT D DEFICIENCY, FRACTURES): Vit D, 25-Hydroxy: 82 ng/mL (ref 30–100)

## 2023-02-26 LAB — TSH: TSH: 2.19 m[IU]/L (ref 0.40–4.50)

## 2023-02-26 NOTE — Progress Notes (Signed)
^<^<^<^<^<^<^<^<^<^<^<^<^<^<^<^<^<^<^<^<^<^<^<^<^<^<^<^<^<^<^<^<^<^<^<^<^ ^>^>^>^>^>^>^>^>^>^>^>>^>^>^>^>^>^>^>^>^>^>^>^>^>^>^>^>^>^>^>^>^>^>^>^>^>  -  Test results slightly outside the reference range are not unusual. If there is anything important, I will review this with you,  otherwise it is considered normal test values.  If you have further questions,  please do not hesitate to contact me at the office or via My Chart.   ^<^<^<^<^<^<^<^<^<^<^<^<^<^<^<^<^<^<^<^<^<^<^<^<^<^<^<^<^<^<^<^<^<^<^<^<^ ^>^>^>^>^>^>^>^>^>^>^>^>^>^>^>^>^>^>^>^>^>^>^>^>^>^>^>^>^>^>^>^>^>^>^>^>^  -  Chol = 163   &  LDL = 66 - Wonderful  - - > >  Excellent   - Very low risk for Heart Attack  / Stroke  ^>^>^>^>^>^>^>^>^>^>^>^>^>^>^>^>^>^>^>^>^>^>^>^>^>^>^>^>^>^>^>^>^>^>^>^>^ ^>^>^>^>^>^>^>^>^>^>^>^>^>^>^>^>^>^>^>^>^>^>^>^>^>^>^>^>^>^>^>^>^>^>^>^>^  -  A1c - Normal - No Diabetes   -  Great   !   ^<^<^<^<^<^<^<^<^<^<^<^<^<^<^<^<^<^<^<^<^<^<^<^<^<^<^<^<^<^<^<^<^<^<^<^<^ ^>^>^>^>^>^>^>^>^>^>^>^>^>^>^>^>^>^>^>^>^>^>^>^>^>^>^>^>^>^>^>^>^>^>^>^>^l -  Vitamin D= 82  - Excellent  - Please keep dosage same   ^<^<^<^<^<^<^<^<^<^<^<^<^<^<^<^<^<^<^<^<^<^<^<^<^<^<^<^<^<^<^<^<^<^<^<^<^ ^>^>^>^>^>^>^>^>^>^>^>^>^>^>^>^>^>^>^>^>^>^>^>^>^>^>^>^>^>^>^>^>^>^>^>^>^  -  All Else - CBC - Kidneys - Electrolytes - Liver - Magnesium & Thyroid    - all  Normal / OK  ^<^<^<^<^<^<^<^<^<^<^<^<^<^<^<^<^<^<^<^<^<^<^<^<^<^<^<^<^<^<^<^<^<^<^<^<^ ^>^>^>^>^>^>^>^>^>^>^>^>^>^>^>^>^>^>^>^>^>^>^>^>^>^>^>^>^>^>^>^>^>^>^>^>^  - Keep up the [place]  Work  !  ^<^<^<^<^<^<^<^<^<^<^<^<^<^<^<^<^<^<^<^<^<^<^<^<^<^<^<^<^<^<^<^<^<^<^<^<^ ^>^>^>^>^>^>^>^>^>^>^>^>^>^>^>^>^>^>^>^>^>^>^>^>^>^>^>^>^>^>^>^>^>^>^>^>^

## 2023-03-25 IMAGING — CR DG KNEE COMPLETE 4+V*L*
4 series · 4 of 4 positions shown · non-contrast
Comparison: None.

CLINICAL DATA: Chronic left knee pain.

EXAM:
LEFT KNEE - COMPLETE 4+ VIEW

[w knee ap left]
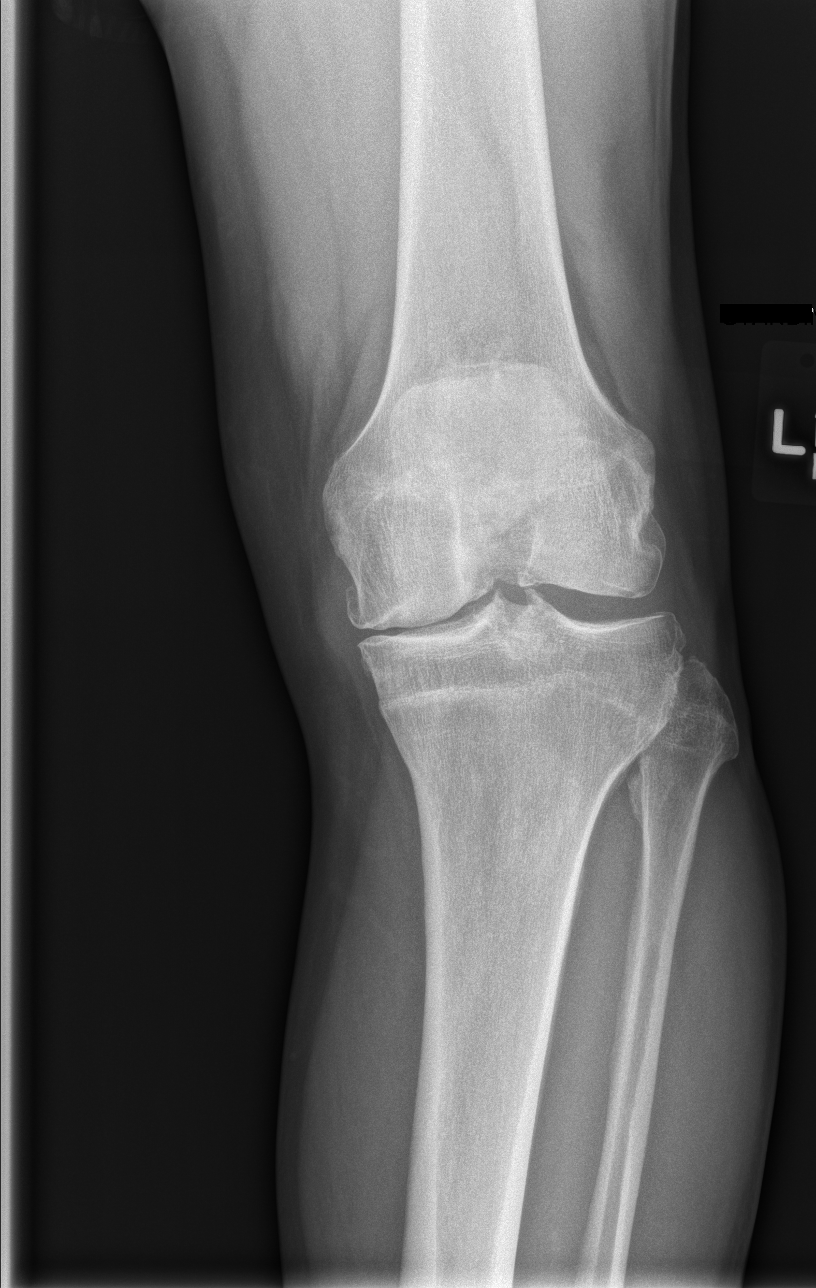

[w knee lat left]
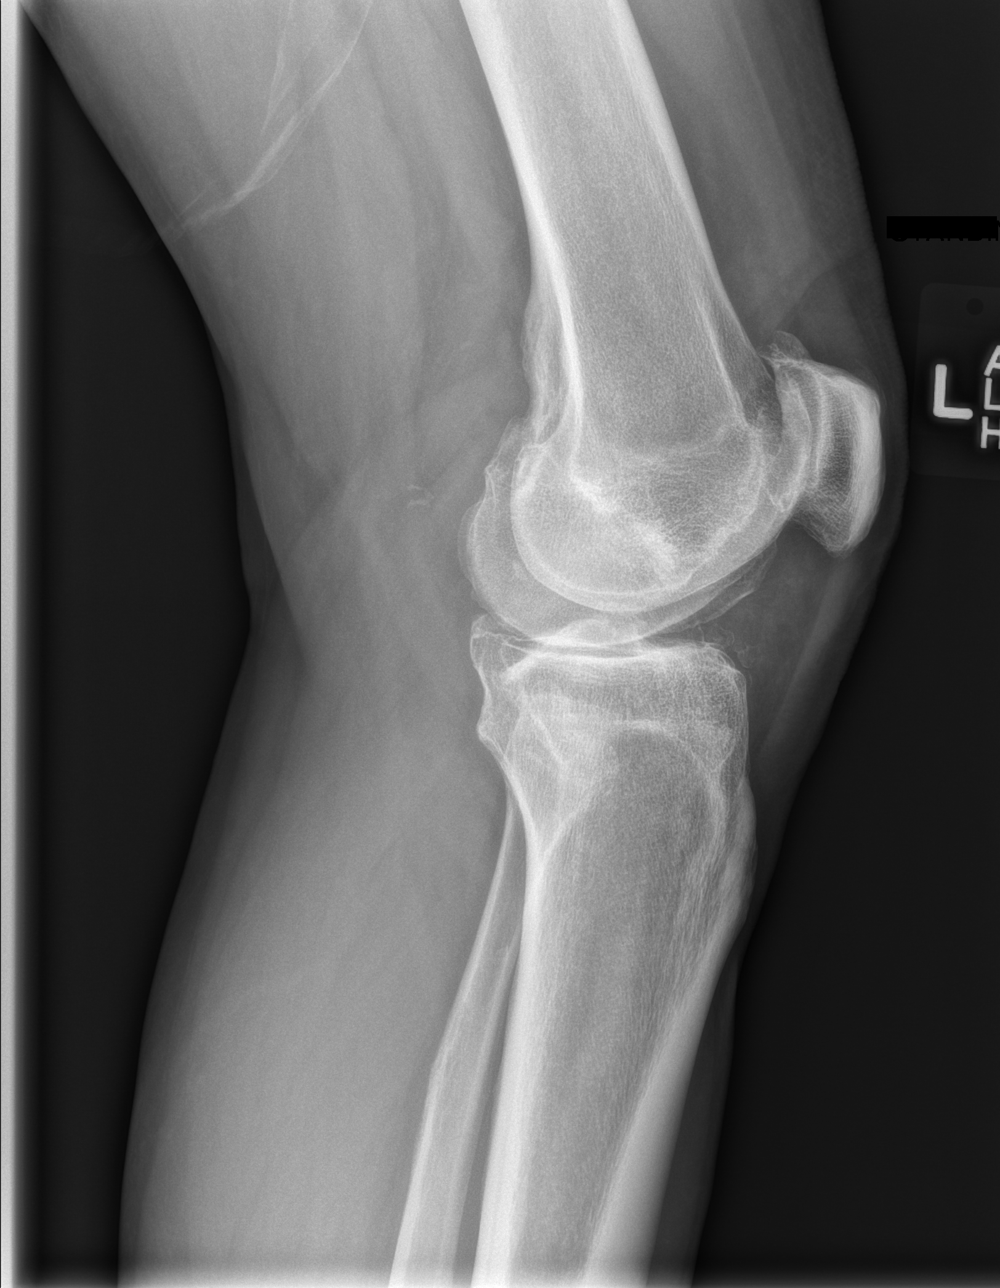

[w knee tunnel pa left]
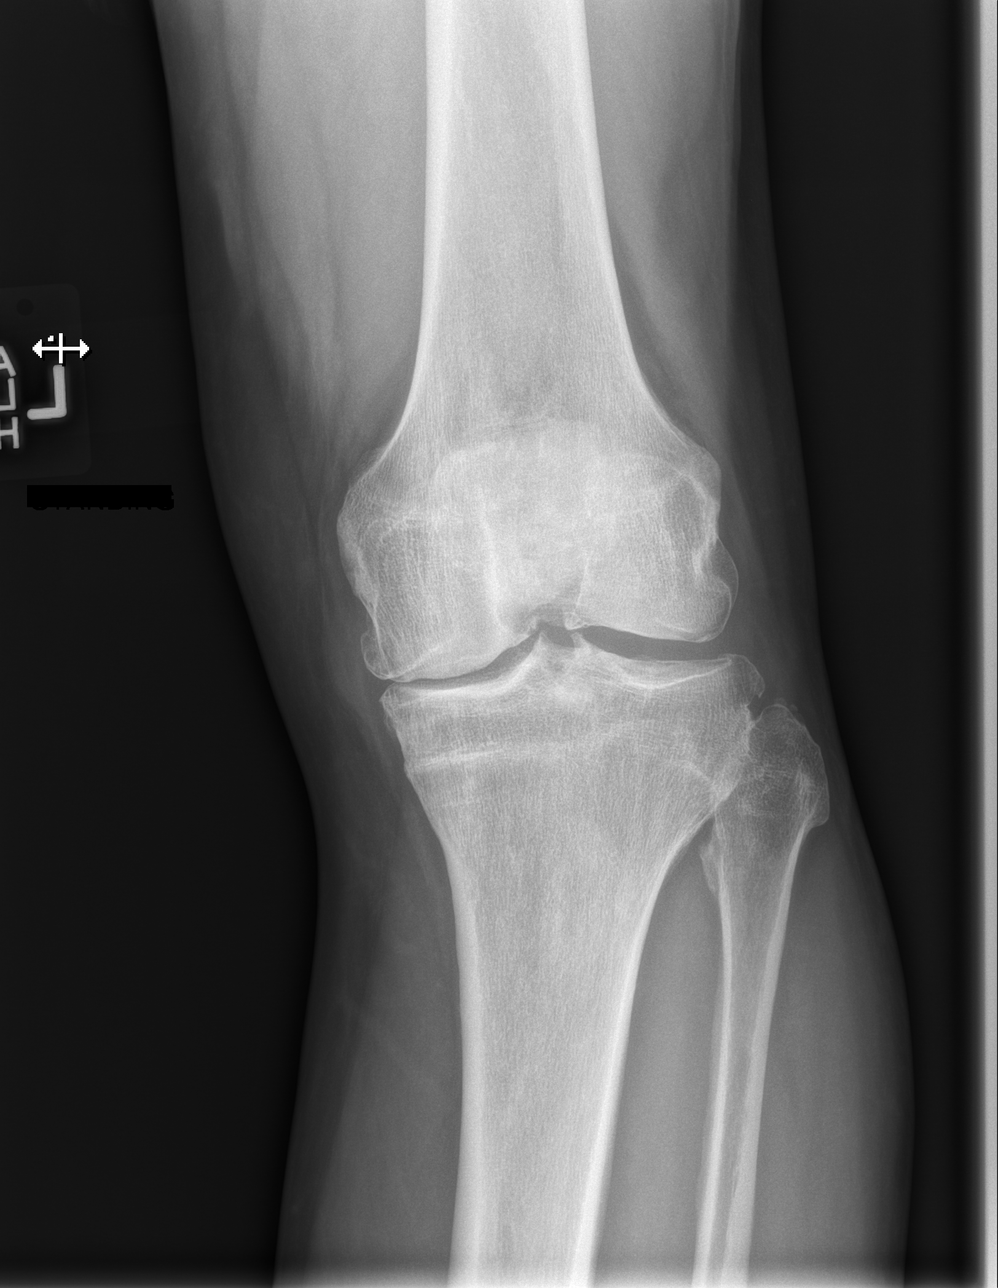

[x knee sunrise left]
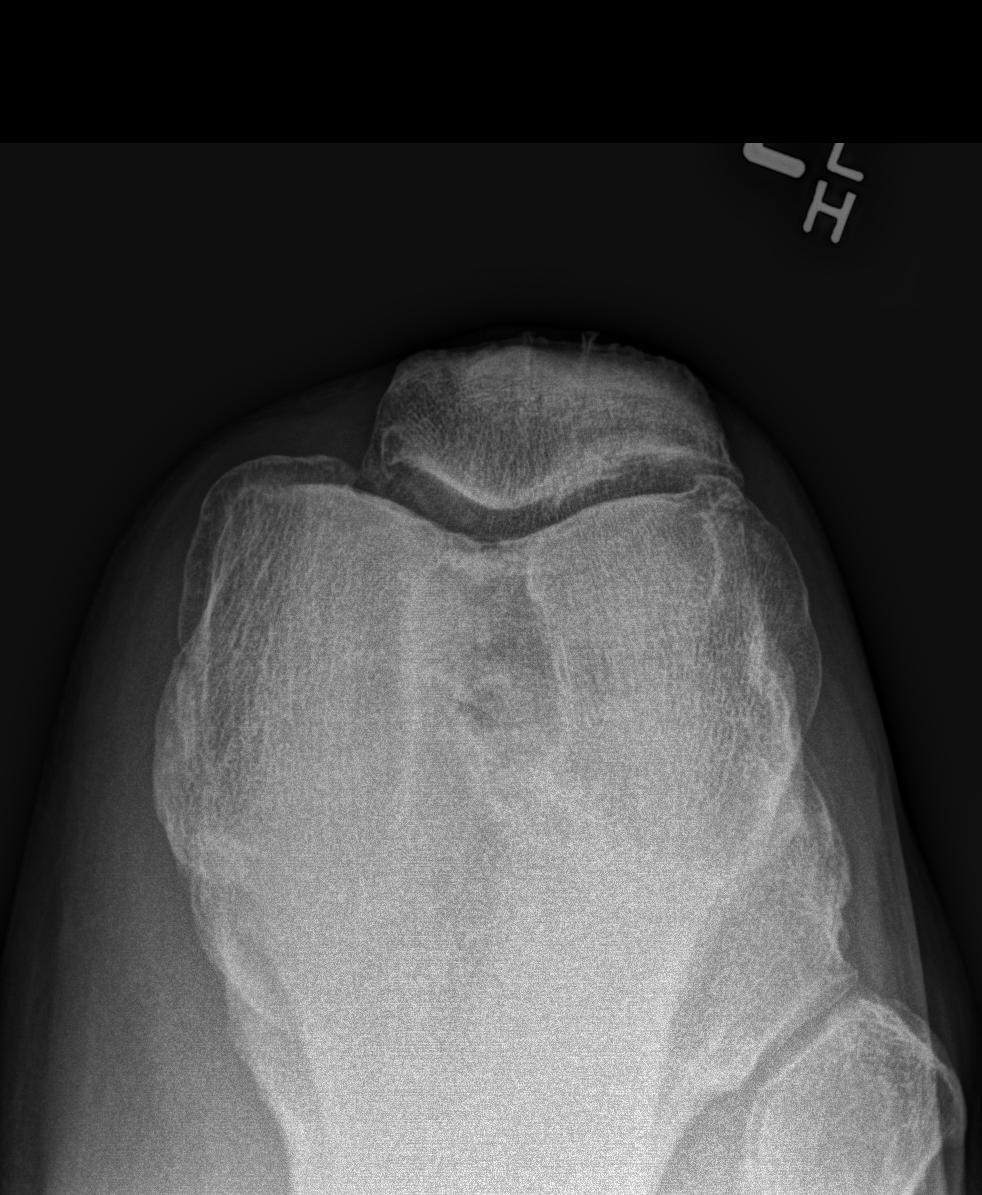

[4 of 4 positions shown; findings below may reference images not displayed]

FINDINGS: No evidence of an acute fracture or dislocation. Moderate to marked
severity medial tibiofemoral compartment space narrowing and
patellofemoral narrowing is seen. Mild lateral tibiofemoral
compartment space narrowing is also noted. A very small joint
effusion is noted.
IMPRESSION: Tricompartmental degenerative changes, most pronounced in the medial
tibiofemoral compartment.

## 2023-05-28 ENCOUNTER — Other Ambulatory Visit: Payer: Self-pay | Admitting: Internal Medicine

## 2023-05-28 DIAGNOSIS — E349 Endocrine disorder, unspecified: Secondary | ICD-10-CM

## 2023-06-03 ENCOUNTER — Ambulatory Visit: Payer: PPO | Admitting: Nurse Practitioner

## 2023-06-13 ENCOUNTER — Ambulatory Visit (INDEPENDENT_AMBULATORY_CARE_PROVIDER_SITE_OTHER): Payer: PPO | Admitting: Nurse Practitioner

## 2023-06-13 ENCOUNTER — Encounter: Payer: Self-pay | Admitting: Nurse Practitioner

## 2023-06-13 VITALS — BP 138/82 | HR 63 | Temp 97.9°F | Ht 72.0 in | Wt 190.8 lb

## 2023-06-13 DIAGNOSIS — Z79899 Other long term (current) drug therapy: Secondary | ICD-10-CM | POA: Diagnosis not present

## 2023-06-13 DIAGNOSIS — E349 Endocrine disorder, unspecified: Secondary | ICD-10-CM

## 2023-06-13 DIAGNOSIS — E559 Vitamin D deficiency, unspecified: Secondary | ICD-10-CM

## 2023-06-13 DIAGNOSIS — F17290 Nicotine dependence, other tobacco product, uncomplicated: Secondary | ICD-10-CM

## 2023-06-13 DIAGNOSIS — I10 Essential (primary) hypertension: Secondary | ICD-10-CM | POA: Diagnosis not present

## 2023-06-13 DIAGNOSIS — M65342 Trigger finger, left ring finger: Secondary | ICD-10-CM | POA: Diagnosis not present

## 2023-06-13 DIAGNOSIS — K5790 Diverticulosis of intestine, part unspecified, without perforation or abscess without bleeding: Secondary | ICD-10-CM

## 2023-06-13 DIAGNOSIS — M1712 Unilateral primary osteoarthritis, left knee: Secondary | ICD-10-CM

## 2023-06-13 DIAGNOSIS — E782 Mixed hyperlipidemia: Secondary | ICD-10-CM

## 2023-06-13 DIAGNOSIS — R7309 Other abnormal glucose: Secondary | ICD-10-CM | POA: Diagnosis not present

## 2023-06-13 NOTE — Patient Instructions (Signed)
Trigger Finger  Trigger finger, also called stenosing tenosynovitis, is a condition that causes a finger or thumb to get stuck in a bent position. Each finger has tough, cord-like tissue (tendon) that connects muscle to bone, and each tendon passes through a tunnel of tissue (tendon sheath). The tendon sheaths are held close to the bone by a pulley. There is a pulley called the A1 pulley that is involved in the triggering of a finger or thumb. To move your finger, your tendon needs to glide freely through the sheath. Trigger finger happens when the tendon or the sheath thickens, making it difficult to bend or straighten your finger as the thickened tendon gets stuck in the A1 pulley. Trigger finger can affect any of the fingers or the thumbs. Mild cases may clear up with rest and medicine. Severe cases require more treatment. What are the causes? Trigger finger or thumb is caused by a thickened finger tendon or tendon sheath. The cause of this thickening is not known. What increases the risk? The following factors may make you more likely to develop this condition: Doing the same movements many times (repetitive activity) that require a strong grip. Having certain health conditions. These include rheumatoid arthritis, gout, carpal tunnel syndrome, or diabetes. Being 40-60 years old. Being male. What are the signs or symptoms? Symptoms of this condition include: Pain when bending or straightening your finger. Tenderness, swelling, or a lump in the palm of your hand just below the finger joint. Hearing a noise like a pop or a snap when you try to straighten your finger. Feeling a catch or locked feeling when you try to straighten your finger. Being unable to straighten your finger without help from your other hand. How is this diagnosed? This condition is diagnosed based on your symptoms and a physical exam. How is this treated? This condition may be treated by: Resting your finger and  avoiding activities that make symptoms worse. Wearing a finger splint to keep your finger extended. Taking NSAIDs, such as ibuprofen, to relieve pain and swelling around the tendon. Doing gentle exercises to stretch the finger as told by your health care provider. Having medicine that reduces swelling and inflammation (steroids) injected into the tendon sheath. Injections may need to be repeated. Trigger finger release. This surgery is done to open the pulley. This may be done if other treatments do not work and you cannot straighten or bend your finger. You may need hand therapy after surgery. Follow these instructions at home: If you have a removable splint: Wear the splint as told by your provider. Remove it only as told by your provider. Check the skin around the splint every day. Tell your provider about any concerns. Loosen the splint if your fingers tingle, become numb, or turn cold and blue. Keep the splint clean and dry. If the splint is not waterproof: Do not let it get wet. Cover it with a watertight covering when you take a bath or shower. Managing pain, stiffness, and swelling     If told, put ice on the painful area. If you have a removable splint, remove it as told by your health care provider. Put ice in a plastic bag. Place a towel between your skin and the bag or between your splint and the bag. Leave the ice on for 20 minutes, 2-3 times a day. If told, apply heat to the affected area as often as told by your provider. Use the heat source that your provider recommends, such as   a moist heat pack or a heating pad. Place a towel between your skin and the heat source. Leave the heat on for 20-30 minutes. If your skin turns bright red, remove the ice or heat right away to prevent skin damage. The risk of damage is higher if you cannot feel pain, heat, or cold. Activity Rest your finger as told by your provider. Avoid activities that make the pain worse. Return to your  normal activities as told by your provider. Ask your provider what activities are safe for you. Do exercises as told by your provider. Ask your provider when it is safe to drive if you have a splint on your hand. General instructions Take over-the-counter and prescription medicines only as told by your provider. Keep all follow-up visits. These are needed to see how you are progressing. Contact a health care provider if: Your symptoms are not improving with home care. This information is not intended to replace advice given to you by your health care provider. Make sure you discuss any questions you have with your health care provider. Document Revised: 02/09/2022 Document Reviewed: 02/09/2022 Elsevier Patient Education  2024 [employer]  

## 2023-06-13 NOTE — Progress Notes (Signed)
FOLLOW UP Assessment:   Essential hypertension Controlled - continue medications Discussed DASH (Dietary Approaches to Stop Hypertension) DASH diet is lower in sodium than a typical American diet. Cut back on foods that are high in saturated fat, cholesterol, and trans fats. Eat more whole-grain foods, fish, poultry, and nuts Remain active and exercise as tolerated daily.  Monitor BP at home-Call if greater than 130/80.  Check CMP/CBC  Testosterone deficiency Continue replacement therapy, patient perceives benefit, check testosterone levels as needed.   Vitamin D deficiency Continue supplement for goal of 60-100 Monitor Vitamin D levels  Other abnormal glucose Education: Reviewed 'ABCs' of diabetes management  Discussed goals to be met and/or maintained include A1C (<7) Blood pressure (<130/80) Cholesterol (LDL <70) Continue Eye Exam yearly  Continue Dental Exam Q6 mo Discussed dietary recommendations Discussed Physical Activity recommendations Check A1C  Medication management All medications discussed and reviewed in full. All questions and concerns regarding medications addressed.    Mixed hyperlipidemia Discussed lifestyle modifications. Recommended diet heavy in fruits and veggies, omega 3's. Decrease consumption of animal meats, cheeses, and dairy products. Remain active and exercise as tolerated. Continue to monitor. Check lipids/TSH  Diverticulosis Controlled Bowel management discussed Continue to monitor  Smoker Rare cigar smoker Discussed risks associated with tobacco use and advised to reduce or quit Smoking cessation instruction/counseling given:  counseled patient on the dangers of tobacco use, advised patient to stop smoking, and reviewed strategies to maximize success  Chronic pain left knee/tricompartment arthritis  Mild sx, continue chondroitin/glucosamine, PRN NSAID Ortho follows PRN  Trigger finger Left ring finger Left 4th MCP joint  (anterior) Stable - if increase in pain, redness, warmth, swelling, contact office for further evaluation.   Orders Placed This Encounter  Procedures   CBC with Differential/Platelet   COMPLETE METABOLIC PANEL WITH GFR   Lipid panel   Notify office for further evaluation and treatment, questions or concerns if any reported s/s fail to improve.   The patient was advised to call back or seek an in-person evaluation if any symptoms worsen or if the condition fails to improve as anticipated.   Further disposition pending results of labs. Discussed med's effects and SE's.    I discussed the assessment and treatment plan with the patient. The patient was provided an opportunity to ask questions and all were answered. The patient agreed with the plan and demonstrated an understanding of the instructions.  Discussed med's effects and SE's. Screening labs and tests as requested with regular follow-up as recommended.  I provided 30 minutes of face-to-face time during this encounter including counseling, chart review, and critical decision making was preformed.  Today's Plan of Care is based on a patient-centered health care approach known as shared decision making - the decisions, tests and treatments allow for patient preferences and values to be balanced with clinical evidence.    Future Appointments  Date Time Provider Department Center  08/22/2023 11:00 AM Lucky Cowboy, MD [facility] None  11/20/2023  2:00 PM Adela Glimpse, NP [facility] None    Subjective:  Brett Perez is a 72 y.o. male who presents for a follow up for HTN, hyperlipidemia, glucose management, and vitamin D Def.   Overall he reports feeling well today.  He has no new concerns at this time.    He has a nodule at the base of his ring finger, palm side, left hand that has slowly grown in size.  Area is not painful, not erythematous, no pustule.  Unable to fully extend  finger.  Pain is occasional.    He is a  occasional cigar smoker.  Denies any increase in SOB, wheezing, mouth sores, oral changes.    He has left knee pain, hx arthroscopies remotely by Dr. Applington/Murphy, likely needs replacement at some point, has seen Dr. Dion Saucier and had xrays in 07/2021, taking glucosamine/chondroitin, rare ibuprofen, monitoring for now.  Currently stable.   BMI is Body mass index is 25.88 kg/m., he has been working on diet and exercise, walking on treadmill 3 days a week, also active in yard.  Wt Readings from Last 3 Encounters:  06/13/23 190 lb 12.8 oz (86.5 kg)  02/25/23 197 lb (89.4 kg)  11/20/22 199 lb 3.2 oz (90.4 kg)   He does have BP cuff, hasn't been checking recently due to consistent good control, today their BP is BP: 138/82 He does workout. He denies chest pain, shortness of breath, dizziness.   He is on cholesterol medication (crestor 10 mg daily) and denies myalgias. His cholesterol is at goal. The cholesterol last visit was:   Lab Results  Component Value Date   CHOL 163 02/25/2023   HDL 75 02/25/2023   LDLCALC 66 02/25/2023   TRIG 134 02/25/2023   CHOLHDL 2.2 02/25/2023   He has been working on diet and exercise for glucose management, and denies foot ulcerations, increased appetite, nausea, paresthesia of the feet, polydipsia, polyuria, visual disturbances, vomiting and weight loss. Last A1C in the office was:  Lab Results  Component Value Date   HGBA1C 5.3 02/25/2023   Last GFR Lab Results  Component Value Date   EGFR 96 02/25/2023    Patient is on Vitamin D supplement and at goal:   Lab Results  Component Value Date   VD25OH 82 02/25/2023     He has a history of testosterone deficiency and is on testosterone replacement (gel). Also on zinc supplement. He states that the testosterone helps with his energy, libido, muscle mass. Lab Results  Component Value Date   TESTOSTERONE 309 07/11/2021     Medication Review:  Current Outpatient Medications (Endocrine & Metabolic):     Testosterone 20.25 MG/ACT (1.62%) GEL, APPLY 1 PUMP TO EACH ARM DAILY  Current Outpatient Medications (Cardiovascular):    atenolol (TENORMIN) 100 MG tablet, TAKE ONE TABLET BY MOUTH DAILY FOR BLOOD PRESSURE (Patient taking differently: 50 mg. TAKE ONE TABLET BY MOUTH DAILY FOR BLOOD PRESSURE)   hydrochlorothiazide (HYDRODIURIL) 25 MG tablet, Take  1 tablet  Daily  for BP and fluid   rosuvastatin (CRESTOR) 10 MG tablet, TAKE 1 TABLET BY MOUTH EVERY DAY   Current Outpatient Medications (Analgesics):    aspirin 81 MG tablet, Take 81 mg by mouth daily.    Current Outpatient Medications (Other):    Cholecalciferol (VITAMIN D PO), Take by mouth. Takes 4000 units daily   fluorouracil (EFUDEX) 5 % cream, Apply topically 2 (two) times daily. Apply topically 2 x / Daily   GLUCOSAMINE-CHONDROITIN PO, Take by mouth 2 (two) times daily.    Multiple Vitamin (MULTIVITAMIN) tablet, Take 1 tablet by mouth daily.   Omega-3 Fatty Acids (FISH OIL) 1000 MG CAPS, Take by mouth daily.   vitamin C (ASCORBIC ACID) 500 MG tablet, Take 500 mg by mouth daily.   zinc gluconate 50 MG tablet, 50 mg. Takes 1 tablet weekly.  Allergies: No Known Allergies  Current Problems (verified) has Hyperlipidemia, mixed; Essential hypertension; Vitamin D deficiency; Testosterone deficiency; Abnormal glucose; Medication management; Cigar smoker; Family history of ischemic heart disease;  Chronic pain of left knee; Tricompartment osteoarthritis of left knee; and Diverticulosis on their problem list.  Screening Tests Immunization History  Administered Date(s) Administered   Fluad Quad(high Dose 65+) 03/28/2020, 04/01/2022   Influenza, High Dose Seasonal PF 03/12/2017, 03/27/2018, 03/17/2021, 04/11/2023   Influenza, Seasonal, Injecte, Preservative Fre 06/21/2015   Influenza-Unspecified 07/01/2014, 04/21/2019   PFIZER Comirnaty(Gray Top)Covid-19 Tri-Sucrose Vaccine 10/07/2020   PFIZER(Purple Top)SARS-COV-2 Vaccination  07/28/2019, 08/18/2019, 03/28/2020   PPD Test 08/13/2014, 08/30/2015   Pfizer Covid-19 Vaccine Bivalent Booster 60yrs & up 03/17/2021   Pfizer(Comirnaty)Fall Seasonal Vaccine 12 years and older 04/11/2023   Pneumococcal Conjugate-13 08/13/2014   Pneumococcal Polysaccharide-23 06/14/2016   Pneumococcal-Unspecified 07/09/1997   Td 07/09/2004   Tdap 08/13/2014   Unspecified SARS-COV-2 Vaccination 04/01/2022   Zoster, Live 06/29/2014    Patient Care Team: Lucky Cowboy, MD as PCP - General (Internal Medicine) Teryl Lucy, MD as Consulting Physician (Orthopedic Surgery) [place], [facility] [address] [facility]  Surgical: He  has a past surgical history that includes Appendectomy (1960); Ankle surgery (Left, 1983); Tonsillectomy (1958); Knee arthroscopy (Left, 1986, 1987); Patellar tendon repair (Right, 11/2016); Mouth surgery (2012); Colonoscopy; and Polypectomy. Family His family history includes Colon cancer (age of onset: 33) in his maternal uncle; Colon cancer (age of onset: 44) in his mother; Colon polyps in his mother. Social history  He reports that he has been smoking cigars. He has never used smokeless tobacco. He reports current alcohol use of about 10.0 standard drinks of alcohol per week. He reports that he does not use drugs.  Objective:   Today's Vitals   06/13/23 1136  BP: 138/82  Pulse: 63  Temp: 97.9 F (36.6 C)  SpO2: 98%  Weight: 190 lb 12.8 oz (86.5 kg)  Height: 6' (1.829 m)   Body mass index is 25.88 kg/m.  General appearance: alert, no distress, WD/WN, male HEENT: normocephalic, sclerae anicteric, TMs pearly, nares patent, no discharge or erythema, pharynx normal Oral cavity: MMM, no lesions Neck: supple, no lymphadenopathy, no thyromegaly, no masses Heart: RRR, normal S1, S2, no murmurs Lungs: CTA bilaterally, no wheezes, rhonchi, or rales Abdomen: +bs, soft, non tender, non distended, no masses, no hepatomegaly, no splenomegaly Musculoskeletal: Left anterior MCP  joint with approximately 1 cm round raised moveable nodule.  Non-tender.  Unable to fully extend.  Otherwise musculoskeletal system WNL Extremities: no edema, no cyanosis, no clubbing Pulses: 2+ symmetric, upper and lower extremities, normal cap refill Neurological: alert, oriented x 3, CN2-12 intact, strength normal upper extremities and lower extremities, sensation normal throughout, DTRs 2+ throughout, no cerebellar signs, gait normal Psychiatric: normal affect, behavior normal, pleasant   Takeo Harts, NP   06/13/2023

## 2023-06-14 LAB — CBC WITH DIFFERENTIAL/PLATELET
Absolute Lymphocytes: 2574 {cells}/uL (ref 850–3900)
Absolute Monocytes: 672 {cells}/uL (ref 200–950)
Basophils Absolute: 30 {cells}/uL (ref 0–200)
Basophils Relative: 0.5 %
Eosinophils Absolute: 198 {cells}/uL (ref 15–500)
Eosinophils Relative: 3.3 %
HCT: 46.6 % (ref 38.5–50.0)
Hemoglobin: 15.6 g/dL (ref 13.2–17.1)
MCH: 31.3 pg (ref 27.0–33.0)
MCHC: 33.5 g/dL (ref 32.0–36.0)
MCV: 93.4 fL (ref 80.0–100.0)
MPV: 9.8 fL (ref 7.5–12.5)
Monocytes Relative: 11.2 %
Neutro Abs: 2526 {cells}/uL (ref 1500–7800)
Neutrophils Relative %: 42.1 %
Platelets: 303 10*3/uL (ref 140–400)
RBC: 4.99 10*6/uL (ref 4.20–5.80)
RDW: 13 % (ref 11.0–15.0)
Total Lymphocyte: 42.9 %
WBC: 6 10*3/uL (ref 3.8–10.8)

## 2023-06-14 LAB — COMPLETE METABOLIC PANEL WITH GFR
AG Ratio: 2.1 (calc) (ref 1.0–2.5)
ALT: 31 U/L (ref 9–46)
AST: 24 U/L (ref 10–35)
Albumin: 4.6 g/dL (ref 3.6–5.1)
Alkaline phosphatase (APISO): 63 U/L (ref 35–144)
BUN: 14 mg/dL (ref 7–25)
CO2: 27 mmol/L (ref 20–32)
Calcium: 9.5 mg/dL (ref 8.6–10.3)
Chloride: 103 mmol/L (ref 98–110)
Creat: 0.89 mg/dL (ref 0.70–1.28)
Globulin: 2.2 g/dL (ref 1.9–3.7)
Glucose, Bld: 87 mg/dL (ref 65–139)
Potassium: 3.9 mmol/L (ref 3.5–5.3)
Sodium: 140 mmol/L (ref 135–146)
Total Bilirubin: 0.6 mg/dL (ref 0.2–1.2)
Total Protein: 6.8 g/dL (ref 6.1–8.1)
eGFR: 91 mL/min/{1.73_m2} (ref >=60)

## 2023-06-14 LAB — LIPID PANEL
Cholesterol: 172 mg/dL (ref <200)
HDL: 91 mg/dL (ref >=40)
LDL Cholesterol (Calc): 65 mg/dL
Non-HDL Cholesterol (Calc): 81 mg/dL (ref <130)
Total CHOL/HDL Ratio: 1.9 (calc) (ref <5.0)
Triglycerides: 76 mg/dL (ref <150)

## 2023-06-28 ENCOUNTER — Telehealth: Payer: Self-pay

## 2023-06-28 NOTE — Telephone Encounter (Signed)
Prior authorization has been submitted through Cover My Meds for his Testosterone 20.25 mg/act.

## 2023-08-22 ENCOUNTER — Encounter: Payer: PPO | Admitting: Internal Medicine

## 2023-09-09 ENCOUNTER — Other Ambulatory Visit: Payer: Self-pay

## 2023-09-09 MED ORDER — HYDROCHLOROTHIAZIDE 25 MG PO TABS: ORAL_TABLET | ORAL | 0 refills | Status: AC

## 2023-11-20 ENCOUNTER — Ambulatory Visit: Payer: PPO | Admitting: Nurse Practitioner

## 2023-12-07 ENCOUNTER — Other Ambulatory Visit: Payer: Self-pay | Admitting: Family

## 2024-03-03 ENCOUNTER — Other Ambulatory Visit: Payer: Self-pay | Admitting: Orthopedic Surgery

## 2024-03-05 LAB — SURGICAL PATHOLOGY

## 2024-07-16 ENCOUNTER — Other Ambulatory Visit: Payer: Self-pay | Admitting: Urology

## 2024-07-16 DIAGNOSIS — N401 Enlarged prostate with lower urinary tract symptoms: Secondary | ICD-10-CM

## 2024-07-16 DIAGNOSIS — R3912 Poor urinary stream: Secondary | ICD-10-CM

## 2024-07-16 DIAGNOSIS — R351 Nocturia: Secondary | ICD-10-CM

## 2024-07-16 DIAGNOSIS — R972 Elevated prostate specific antigen [PSA]: Secondary | ICD-10-CM

## 2024-08-10 ENCOUNTER — Encounter: Payer: Self-pay | Admitting: Gastroenterology

## 2024-08-13 ENCOUNTER — Encounter: Payer: Self-pay | Admitting: Gastroenterology

## 2024-08-26 ENCOUNTER — Other Ambulatory Visit

## 2024-09-11 ENCOUNTER — Encounter

## 2024-09-25 ENCOUNTER — Encounter: Admitting: Gastroenterology
